# Patient Record
Sex: Female | Born: 1955
Health system: Southern US, Community
[De-identification: ages and names within clinical notes are randomized; demographics above are authoritative.]

## PROBLEM LIST (undated history)

## (undated) DIAGNOSIS — N950 Postmenopausal bleeding: Secondary | ICD-10-CM

## (undated) DIAGNOSIS — L309 Dermatitis, unspecified: Secondary | ICD-10-CM

## (undated) DIAGNOSIS — N882 Stricture and stenosis of cervix uteri: Secondary | ICD-10-CM

## (undated) DIAGNOSIS — I471 Supraventricular tachycardia: Secondary | ICD-10-CM

## (undated) DIAGNOSIS — Z860101 Personal history of adenomatous and serrated colon polyps: Secondary | ICD-10-CM

## (undated) DIAGNOSIS — C50919 Malignant neoplasm of unspecified site of unspecified female breast: Secondary | ICD-10-CM

## (undated) DIAGNOSIS — Z803 Family history of malignant neoplasm of breast: Secondary | ICD-10-CM

## (undated) DIAGNOSIS — Z8489 Family history of other specified conditions: Secondary | ICD-10-CM

## (undated) DIAGNOSIS — C50411 Malignant neoplasm of upper-outer quadrant of right female breast: Principal | ICD-10-CM

## (undated) DIAGNOSIS — M1990 Unspecified osteoarthritis, unspecified site: Secondary | ICD-10-CM

## (undated) DIAGNOSIS — Z800 Family history of malignant neoplasm of digestive organs: Secondary | ICD-10-CM

## (undated) DIAGNOSIS — R9389 Abnormal findings on diagnostic imaging of other specified body structures: Secondary | ICD-10-CM

## (undated) DIAGNOSIS — Z923 Personal history of irradiation: Secondary | ICD-10-CM

## (undated) DIAGNOSIS — Z170 Estrogen receptor positive status [ER+]: Secondary | ICD-10-CM

## (undated) DIAGNOSIS — Z9221 Personal history of antineoplastic chemotherapy: Secondary | ICD-10-CM

## (undated) DIAGNOSIS — Z86010 Personal history of colonic polyps: Secondary | ICD-10-CM

## (undated) DIAGNOSIS — T7840XA Allergy, unspecified, initial encounter: Secondary | ICD-10-CM

## (undated) DIAGNOSIS — Z8679 Personal history of other diseases of the circulatory system: Secondary | ICD-10-CM

## (undated) HISTORY — PX: WISDOM TOOTH EXTRACTION: SHX21

## (undated) HISTORY — PX: POLYPECTOMY: SHX149

## (undated) HISTORY — DX: Allergy, unspecified, initial encounter: T78.40XA

## (undated) HISTORY — DX: Malignant neoplasm of unspecified site of unspecified female breast: C50.919

## (undated) HISTORY — DX: Family history of malignant neoplasm of breast: Z80.3

## (undated) HISTORY — DX: Family history of malignant neoplasm of digestive organs: Z80.0

## (undated) HISTORY — DX: Malignant neoplasm of upper-outer quadrant of right female breast: C50.411

## (undated) HISTORY — PX: COLONOSCOPY: SHX174

## (undated) HISTORY — DX: Supraventricular tachycardia: I47.1

---

## 2001-01-28 ENCOUNTER — Other Ambulatory Visit: Admission: RE | Admit: 2001-01-28 | Discharge: 2001-01-28 | Payer: Self-pay | Admitting: Obstetrics & Gynecology

## 2002-02-17 ENCOUNTER — Other Ambulatory Visit: Admission: RE | Admit: 2002-02-17 | Discharge: 2002-02-17 | Payer: Self-pay | Admitting: Obstetrics & Gynecology

## 2003-06-29 ENCOUNTER — Other Ambulatory Visit: Admission: RE | Admit: 2003-06-29 | Discharge: 2003-06-29 | Payer: Self-pay | Admitting: Obstetrics & Gynecology

## 2004-08-15 ENCOUNTER — Other Ambulatory Visit: Admission: RE | Admit: 2004-08-15 | Discharge: 2004-08-15 | Payer: Self-pay | Admitting: Obstetrics & Gynecology

## 2004-08-30 ENCOUNTER — Encounter: Admission: RE | Admit: 2004-08-30 | Discharge: 2004-08-30 | Payer: Self-pay | Admitting: Obstetrics & Gynecology

## 2008-06-05 ENCOUNTER — Encounter: Admission: RE | Admit: 2008-06-05 | Discharge: 2008-06-05 | Payer: Self-pay | Admitting: Obstetrics & Gynecology

## 2008-08-19 ENCOUNTER — Ambulatory Visit: Payer: Self-pay | Admitting: Internal Medicine

## 2008-08-19 DIAGNOSIS — I471 Supraventricular tachycardia, unspecified: Secondary | ICD-10-CM | POA: Insufficient documentation

## 2009-06-14 DEATH — deceased

## 2010-11-27 NOTE — Letter (Signed)
August 19, 2008    Pam Drown, MD  [address]  [place], [place] [address]   RE:  [name], [name]  MRN:  [id number]  /  DOB:  [birth year]   Dear Toniann Fail,   It was a pleasure to see Catherine Lawson at your request because of  recurrent episodes of tachy palpitations.   As you know, she is a 55 year old married woman who has a history of  recurrent abrupt onset and offset tachy palpitations that have been  going on for about 9 or 10 years.  She counts her heart beat with them  and they are over 180 beats per minute.  They have lasted from 30  minutes up to more than 4 hours.  They initially occurred once or twice  a year, now occurring every couple of months.  They are unassociated  with shortness of breath, chest pain, or lightheadedness except at the  very initial moment.  There are some flushing at that time.  They are  diuretic negative, but they are frog positive.  She does not notice any  association with a little bit of caffeine that she uses.  She has not  tried current massage or Valsalva.   She is quite fit exercising multiple times per week.   PAST MEDICAL HISTORY:  Broadly negative.  Notable only for asthma and  review of systems in addition to this was probably negative across  multiple organ systems.   Her medications include Yasmin birth control pills and Zyrtec for  allergies.   She has no known drug allergies.   She has had no prior surgery.   She is married.  She does not use cigarettes or recreational drugs.  She  drinks alcohol occasionally.  She has no children.   PHYSICAL EXAMINATION:  GENERAL:  She is a middle-aged Caucasian female  appearing her stated age of 30.  VITAL SIGNS:  Her blood pressure is 114/72 and her pulse is 72.  HEENT:  No icterus or xanthoma.  NECK:  Veins were flat.  The carotids are brisk and full bilaterally  without bruits.  BACK:  Without kyphosis or scoliosis.  LUNGS:  Clear.  HEART:  Sounds were regular  without murmurs or gallops.  ABDOMEN:  Soft with active bowel sounds.  EXTREMITIES:  Femoral pulses were 2+.  Distal pulses were intact.  There  is no clubbing, cyanosis, or edema.  NEUROLOGIC:  Grossly normal.  SKIN:  Warm and dry.   Electrocardiogram dated today demonstrated sinus rhythm at 72 with  intervals of 0.16/0.19/0.42.  The axis was 83.   IMPRESSION:  Almost certainly supraventricular tachycardia mediated  atrioventricular nodal reentry.   DISCUSSION:  Toniann Fail, [name]' symptoms are consistent with AV nodal  reentry, which is a common form of supraventricular tachycardia in  woman.  It is possible, but unlikely that represents a benign form of  ventricular tachycardia  based on her extreme exercise program and the  lack of symptoms.   We discussed treatment options for supraventricular tachycardia and also  as well as the likelihood that this was the diagnosis.  These included  vagal maneuvers including carotid massage and Valsalva, p.r.n. beta-  blockers or calcium blockers.  Daily therapy with similar drugs and/or  catheter ablation.  We discussed the potential benefits as well as  potential risks including, but not limited to death, perforation, and  heart block.  She understands these risks.   She would like to take  no medications at this point.  She would like to  see if she can use vagal maneuvers to terminate her tachycardia.  I have  advised her that she can also go to hospital in the event that they  persist.   She would like to get back up with [place] as her niece dictate and so no  followup was scheduled.   Thanks very much for allowing [place] to see her.    Sincerely,      Duke Salvia, MD, [facility]  Electronically Signed    SCK/MedQ  DD: 08/19/2008  DT: 08/20/2008  Job #: [id number]

## 2015-02-06 ENCOUNTER — Encounter: Payer: Self-pay | Admitting: *Deleted

## 2015-02-06 ENCOUNTER — Telehealth: Payer: Self-pay | Admitting: *Deleted

## 2015-02-06 DIAGNOSIS — Z170 Estrogen receptor positive status [ER+]: Secondary | ICD-10-CM

## 2015-02-06 DIAGNOSIS — C50411 Malignant neoplasm of upper-outer quadrant of right female breast: Secondary | ICD-10-CM

## 2015-02-06 DIAGNOSIS — Z853 Personal history of malignant neoplasm of breast: Secondary | ICD-10-CM | POA: Insufficient documentation

## 2015-02-06 NOTE — Telephone Encounter (Signed)
Left vm for pt to return call regarding [place] appt for 02/08/15

## 2015-02-06 NOTE — Telephone Encounter (Signed)
Confirmed [facility] for 02/08/15 at 1230 .  Instructions and contact information given.

## 2015-02-08 ENCOUNTER — Ambulatory Visit: Payer: Managed Care, Other (non HMO)

## 2015-02-08 ENCOUNTER — Encounter: Payer: Self-pay | Admitting: Oncology

## 2015-02-08 ENCOUNTER — Ambulatory Visit
Admission: RE | Admit: 2015-02-08 | Discharge: 2015-02-08 | Disposition: A | Discharge status: Home / Self Care | Payer: Managed Care, Other (non HMO) | Source: Ambulatory Visit | Attending: Radiation Oncology | Admitting: Radiation Oncology

## 2015-02-08 ENCOUNTER — Encounter: Payer: Self-pay | Admitting: Genetic Counselor

## 2015-02-08 ENCOUNTER — Other Ambulatory Visit: Payer: Self-pay | Admitting: *Deleted

## 2015-02-08 ENCOUNTER — Ambulatory Visit (HOSPITAL_BASED_OUTPATIENT_CLINIC_OR_DEPARTMENT_OTHER): Payer: Managed Care, Other (non HMO) | Admitting: Oncology

## 2015-02-08 ENCOUNTER — Other Ambulatory Visit (HOSPITAL_BASED_OUTPATIENT_CLINIC_OR_DEPARTMENT_OTHER): Payer: Managed Care, Other (non HMO)

## 2015-02-08 ENCOUNTER — Ambulatory Visit: Payer: Managed Care, Other (non HMO) | Attending: General Surgery | Admitting: Physical Therapy

## 2015-02-08 ENCOUNTER — Encounter: Payer: Self-pay | Admitting: Physical Therapy

## 2015-02-08 ENCOUNTER — Encounter: Payer: Self-pay | Admitting: *Deleted

## 2015-02-08 ENCOUNTER — Telehealth: Payer: Self-pay | Admitting: Oncology

## 2015-02-08 ENCOUNTER — Encounter: Payer: Self-pay | Admitting: Nurse Practitioner

## 2015-02-08 ENCOUNTER — Encounter (INDEPENDENT_AMBULATORY_CARE_PROVIDER_SITE_OTHER): Payer: Self-pay

## 2015-02-08 VITALS — BP 143/68 | HR 73 | Temp 98.4°F | Resp 18 | Ht 65.0 in | Wt 144.1 lb

## 2015-02-08 DIAGNOSIS — Z170 Estrogen receptor positive status [ER+]: Secondary | ICD-10-CM

## 2015-02-08 DIAGNOSIS — C50411 Malignant neoplasm of upper-outer quadrant of right female breast: Secondary | ICD-10-CM | POA: Insufficient documentation

## 2015-02-08 DIAGNOSIS — I471 Supraventricular tachycardia: Secondary | ICD-10-CM

## 2015-02-08 DIAGNOSIS — C50912 Malignant neoplasm of unspecified site of left female breast: Secondary | ICD-10-CM

## 2015-02-08 LAB — COMPREHENSIVE METABOLIC PANEL (CC13)
ALT: 16 U/L (ref 0–55)
AST: 20 U/L (ref 5–34)
Albumin: 4.4 g/dL (ref 3.5–5.0)
Alkaline Phosphatase: 111 U/L (ref 40–150)
Anion Gap: 9 mEq/L (ref 3–11)
BUN: 14 mg/dL (ref 7.0–26.0)
CALCIUM: 9.7 mg/dL (ref 8.4–10.4)
CHLORIDE: 109 meq/L (ref 98–109)
CO2: 26 mEq/L (ref 22–29)
CREATININE: 0.9 mg/dL (ref 0.6–1.1)
EGFR: 69 mL/min/{1.73_m2} — ABNORMAL LOW (ref >90)
GLUCOSE: 87 mg/dL (ref 70–140)
Potassium: 4.7 mEq/L (ref 3.5–5.1)
Sodium: 144 mEq/L (ref 136–145)
TOTAL PROTEIN: 7.3 g/dL (ref 6.4–8.3)
Total Bilirubin: 0.57 mg/dL (ref 0.20–1.20)

## 2015-02-08 LAB — CBC WITH DIFFERENTIAL/PLATELET
BASO%: 0.8 % (ref 0.0–2.0)
BASOS ABS: 0.1 10*3/uL (ref 0.0–0.1)
EOS%: 0.5 % (ref 0.0–7.0)
Eosinophils Absolute: 0 10*3/uL (ref 0.0–0.5)
HCT: 42.9 % (ref 34.8–46.6)
HEMOGLOBIN: 14.4 g/dL (ref 11.6–15.9)
LYMPH#: 1.8 10*3/uL (ref 0.9–3.3)
LYMPH%: 20.9 % (ref 14.0–49.7)
MCH: 30.3 pg (ref 25.1–34.0)
MCHC: 33.7 g/dL (ref 31.5–36.0)
MCV: 90 fL (ref 79.5–101.0)
MONO#: 0.6 10*3/uL (ref 0.1–0.9)
MONO%: 6.7 % (ref 0.0–14.0)
NEUT%: 71.1 % (ref 38.4–76.8)
NEUTROS ABS: 6.2 10*3/uL (ref 1.5–6.5)
Platelets: 290 10*3/uL (ref 145–400)
RBC: 4.76 10*6/uL (ref 3.70–5.45)
RDW: 13.1 % (ref 11.2–14.5)
WBC: 8.8 10*3/uL (ref 3.9–10.3)

## 2015-02-08 MED ORDER — ANASTROZOLE 1 MG PO TABS: 1.0000 mg | ORAL_TABLET | Freq: Every day | ORAL | Status: DC

## 2015-02-08 NOTE — Progress Notes (Signed)
Ms. Guerin is a very pleasant 59 y.o. female from [place], [place] with newly diagnosed grade 2-3 invasive ductal carcinoma of the right breast.  Biopsy results revealed the tumor's prognostic profile is ER positive, PR positive, and HER2/neu negative. Ki67 is 40%.  She presents today with her husband to the [facility]) for treatment consideration and recommendations from the breast surgeon, radiation oncologist, and medical oncologist.     I briefly met with Ms. Defalco and her husband during her [facility] visit today. We discussed the purpose of the Survivorship Clinic, which will include monitoring for recurrence, coordinating completion of age and gender-appropriate cancer screenings, promotion of overall wellness, as well as managing potential late/long-term side effects of anti-cancer treatments.    The treatment plan for Ms. Fletes will likely include neoadjuvant anti-estrogen therapy, surgery, radiation therapy, and adjuvant anti-estrogen therapy.  She will meet with the Genetics Counselor due to her family history of breast cancer. As of today, the intent of treatment for Ms. Lamboy is cure, therefore she will be eligible for the Survivorship Clinic upon her completion of treatment.  Her survivorship care plan (SCP) document will be drafted and updated throughout the course of her treatment trajectory. She will receive the SCP in an office visit with myself in the Survivorship Clinic once she has completed treatment.   Ms. Chirino was encouraged to ask questions and all questions were answered to her satisfaction.  She was given my business card and encouraged to contact me with any concerns regarding survivorship.  I look forward to participating in her care.   Kenn File, NP [facility] [phone]

## 2015-02-08 NOTE — Progress Notes (Signed)
Checked in new pt with no financial concerns.  Pt has my card for any billing questions or concerns. ° °

## 2015-02-08 NOTE — Progress Notes (Signed)
g Radiation Oncology         [phone] ________________________________  Name: Catherine Lawson MRN: [id number]  Date: 02/08/2015  DOB: 1955-11-01  GB:EEFEOFH,QRFXJ, MD  Excell Seltzer, MD     REFERRING PHYSICIAN: Excell Seltzer, MD   DIAGNOSIS: The encounter diagnosis was Breast cancer of upper-outer quadrant of right female breast.    HISTORY OF PRESENT ILLNESS::Catherine Lawson is a 59 y.o. female who is seen for an initial consultation visit regarding the patient's diagnosis of breast cancer.  The patient was found to have suspicious findings within the right breast on initial mammogram. The patient has not had symptoms prior to this study. A diagnostic mammogram and breast ultrasound confirmed this finding. On ultrasound, the tumors measured 1.9 cm and 7 mm (2 cm apart) and was present in the upper-outer quadrant. Axillary lymph nodes were negative.   A biopsy was performed. This revealed invasive ductal carcinoma. Receptors studies were completed and indicate that the tumor is estrogen receptor positve, progesterone receptor positive, and Her-2/neu negative. The Ki-67 staining was 40%.  The patient has not undergone an MRI scan of the breasts: An MRI will be scheduled.    PREVIOUS RADIATION THERAPY: No   PAST MEDICAL HISTORY:  has a past medical history of Breast cancer of upper-outer quadrant of right female breast (02/06/2015) and Breast cancer.     PAST SURGICAL HISTORY:History reviewed. No pertinent past surgical history.   FAMILY HISTORY: family history includes Breast cancer (age of onset: 69) in her sister; Cancer in her maternal grandmother and other; Colon cancer in her father; Stomach cancer in her paternal grandmother.   SOCIAL HISTORY:  reports that she has never smoked. She does not have any smokeless tobacco history on file. She reports that she drinks alcohol. She reports that she does not use illicit drugs.   ALLERGIES: Review of patient's allergies  indicates no known allergies.   MEDICATIONS:  Current Outpatient Prescriptions  Medication Sig Dispense Refill  . anastrozole (ARIMIDEX) 1 MG tablet Take 1 tablet (1 mg total) by mouth daily. 90 tablet 4  . cetirizine (ZYRTEC) 10 MG tablet Take 10 mg by mouth daily.    . halobetasol (ULTRAVATE) 0.05 % ointment Apply topically as needed.     No current facility-administered medications for this encounter.     REVIEW OF SYSTEMS:  A 15 point review of systems is documented in the electronic medical record. This was obtained by the nursing staff. However, I reviewed this with the patient to discuss relevant findings and make appropriate changes.  Pertinent items are noted in HPI.    PHYSICAL EXAM:    Vitals with Age-Percentiles 02/08/2015  Length 883.2 cm  Systolic 549  Diastolic 68  Pulse 73  Respiration 18  Weight 65.363 kg  BMI 24     ECOG = 0  0 - Asymptomatic (Fully active, able to carry on all predisease activities without restriction)  1 - Symptomatic but completely ambulatory (Restricted in physically strenuous activity but ambulatory and able to carry out work of a light or sedentary nature. For example, light housework, office work)  2 - Symptomatic, <50% in bed during the day (Ambulatory and capable of all self care but unable to carry out any work activities. Up and about more than 50% of waking hours)  3 - Symptomatic, >50% in bed, but not bedbound (Capable of only limited self-care, confined to bed or chair 50% or more of waking hours)  4 - Bedbound (Completely disabled.  Cannot carry on any self-care. Totally confined to bed or chair)  5 - Death   Eustace Pen MM, Creech RH, Tormey DC, et al. [phone]). "Toxicity and response criteria of the [facility]". [place] Oncol. 5 (6): 649-55  General: Well-developed, in no acute distress HEENT: Normocephalic, atraumatic; oral cavity clear Neck: Supple without any lymphadenopathy Cardiovascular:  Regular rate and rhythm Respiratory: Clear to auscultation bilaterally Breasts: Bruising in lateral right breast, with underlying biopsy changes, otherwise normal appearing GI: Soft, nontender, normal bowel sounds Extremities: No edema present Neuro: No focal deficits   LABORATORY DATA:  Lab Results  Component Value Date   WBC 8.8 02/08/2015   HGB 14.4 02/08/2015   HCT 42.9 02/08/2015   MCV 90.0 02/08/2015   PLT 290 02/08/2015   Lab Results  Component Value Date   NA 144 02/08/2015   K 4.7 02/08/2015   CO2 26 02/08/2015   Lab Results  Component Value Date   ALT 16 02/08/2015   AST 20 02/08/2015   ALKPHOS 111 02/08/2015   BILITOT 0.57 02/08/2015      RADIOGRAPHY: No results found.     IMPRESSION:    Breast cancer of upper-outer quadrant of right female breast   02/06/2015 Initial Diagnosis Breast cancer of upper-outer quadrant of right female breast    The patient has a recent diagnosis of invasive ductal carcinoma of the right breast. She appears to be a good candidate for breast conservation treatment.  I discussed with the patient the role of adjuvant radiation treatment in this setting. We discussed the potential benefit of radiation treatment, especially with regards to local control of the patient's tumor. We also discussed the possible side effects and risks of such a treatment as well.  All of the patient's questions were answered. Patient will be receiving anti-estrogen treatment and Oncotype testing These studies will determine options for lumpectomy vs mastectomy.   PLAN: I look forward to seeing the patient postoperatively to review her case and further discuss and coordinate an anticipated course of radiation treatment.       ________________________________   Jodelle Gross, MD, PhD     This document serves as a record of services personally performed by Kyung Rudd, MD. It was created on his behalf by Derek Mound, a trained medical scribe. The  creation of this record is based on the scribe's personal observations and the provider's statements to them. This document has been checked and approved by the attending provider.

## 2015-02-08 NOTE — Progress Notes (Signed)
[facility]  [phone] [phone]     ID: Catherine Lawson DOB: [birth year]  MR#: [id number]  RCB#:[id number]  Patient Care Team: Cari Caraway, MD as PCP - General (Family Medicine) Excell Seltzer, MD as Consulting Physician (General Surgery) Chauncey Cruel, MD as Consulting Physician (Oncology) Kyung Rudd, MD as Consulting Physician (Radiation Oncology) Mauro Kaufmann, RN as Registered Nurse Rockwell Germany, RN as Registered Nurse Sylvan Cheese, NP as Nurse Practitioner (Nurse Practitioner) Vania Rea, MD as Consulting Physician (Obstetrics and Gynecology) PCP: Cari Caraway, MD OTHER MD:  CHIEF COMPLAINT: Estrogen receptor positive breast cancer  CURRENT TREATMENT: Neoadjuvant anastrozole   BREAST CANCER HISTORY: "Catherine Lawson" had routine screening mammography at [facility] 01/24/2015. This showed a 1.6 cm mass in the right breast at the 11:00 position, and on 0.8 cm irregular density also at the 11:00 position farther away from the nipple. On 02/01/2015 she underwent a unilateral right diagnostic mammography at [facility]. The breast density was category B. In addition to the 2 masses previously noted there was a cluster of calcifications at the 9:00 position. Ultrasonography the same day showed a 1.9 cm oval mass, which was hypoechoic with no vascularity and hard on L a Stogner if he, a 7 mm mass at the 10:00 position with no vascularity and intermediate L a Stogner 3, and a lymph node with uniform cortex thickening in the right axilla. The 2 masses in question were separated by 2 cm.  On 02/02/2015 the patient underwent biopsy of what appears to have been the larger of the 2 breast masses (I cannot locate the biopsy report). This documented invasive ductal carcinoma, grade 2 or 3, with micropapillary features, estrogen and progesterone receptor positive, with an MIB-1 of 40%, and HER-2 not amplified with a signals ratio of 1.36 and a number per cell  of 3.60. There was evidence of lymphovascular invasion.  Because of bleeding from the first biopsy, the second breast mass could not be performed. The lymph node was aspirated, not cord, and this appeared benign but the concordance is questionable.  The patient's subsequent history is as detailed below  INTERVAL HISTORY: Catherine Lawson was evaluated in the multidisciplinary breast cancer clinic 02/08/2015 accompanied by her husband Event organiser. Her case was also presented in the multidisciplinary breast cancer conference that same morning. At that time a preliminary plan was proposed: Genetics testing, breast MRI and additional biopsies as needed if breast conserving surgery was planned. Oncotype was proposed. Neoadjuvant treatment was suggested. The patient was felt also to be likely to benefit from radiation  REVIEW OF SYSTEMS: There were no specific symptoms leading to the original mammogram, which was routinely scheduled. The patient denies unusual headaches, visual changes, nausea, vomiting, stiff neck, dizziness, or gait imbalance. There has been no cough, phlegm production, or pleurisy, no chest pain or pressure, and no change in bowel or bladder habits. The patient denies fever, rash, bleeding, unexplained fatigue or unexplained weight loss. She does have a history of palpitations which have been extensively worked up and which are improving now that she has retired from her stressful job. A detailed review of systems was otherwise entirely negative.  PAST MEDICAL HISTORY: Past Medical History  Diagnosis Date  . Breast cancer of upper-outer quadrant of right female breast 02/06/2015  . Breast cancer     PAST SURGICAL HISTORY: History reviewed. No pertinent past surgical history.  FAMILY HISTORY Family History  Problem Relation Age of Onset  . Breast cancer Sister 55  .  Colon cancer Father   . Cancer Maternal Grandmother   . Stomach cancer Paternal Grandmother   . Cancer Other    the patient's  father is still living, at age 58. The patient's mother died from Parkinson's disease complications at age 58 The patient had 5 brothers, 4 sisters. One sister was diagnosed with breast cancer at age 67 and died at age 58. The patient's father was diagnosed with colon cancer at the age of 66. A nephew was diagnosed with medulloblastoma at age 36 and died at age 60. The paternal grandmother was diagnosed with stomach cancer at age 58. The maternal grandmother was diagnosed with metastatic cancer of unknown type at age 58. (The description is suggestive of an ovarian cancer).  GYNECOLOGIC HISTORY:  No LMP recorded. Menarche age 89. The patient is GX P0. She stopped having periods in 2014. She did not take hormone replacement. She took oral contraceptives for approximately 20 years with no complications.  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 Her husband Catherine Lawson works for a Merchandiser, retail in [employer] "[employer] credited Warehouse manager". Catherine Lawson has 2 daughters from an earlier marriage, Catherine Lawson lives in [place] and works in recruiting, and Catherine Lawson lives in [place] and works in Press photographer. They are expecting their first grandchild September of this year. The patient attends the [employer]   HEALTH MAINTENANCE: History  Substance Use Topics  . Smoking status: Never Smoker   . Smokeless tobacco: Not on file  . Alcohol Use: Yes     Colonoscopy: 2014/lobe are  PAP: 2015  Bone density: Remote  Lipid panel:  No Known Allergies  Current Outpatient Prescriptions  Medication Sig Dispense Refill  . cetirizine (ZYRTEC) 10 MG tablet Take 10 mg by mouth daily.    . halobetasol (ULTRAVATE) 0.05 % ointment Apply topically as needed.    Marland Kitchen anastrozole (ARIMIDEX) 1 MG tablet Take 1 tablet (1 mg total) by mouth daily. 90 tablet 4   No current facility-administered medications for this visit.     OBJECTIVE: Middle-aged white woman who appears younger than stated age 59 Vitals:   02/08/15 1320  BP: 143/68  Pulse: 73  Temp: 98.4 F (36.9 C)  Resp: 18     Body mass index is 23.98 kg/(m^2).    ECOG FS:0 - Asymptomatic  Ocular: Sclerae unicteric, pupils equal, round and reactive to light Ear-nose-throat: Oropharynx clear and moist Lymphatic: No cervical or supraclavicular adenopathy Lungs no rales or rhonchi, good excursion bilaterally Heart regular rate and rhythm, no murmur appreciated Abd soft, nontender, positive bowel sounds MSK no focal spinal tenderness, no joint edema Neuro: non-focal, well-oriented, appropriate affect Breasts: The right breast is status post recent biopsy. There is an ecchymosis at the site with a palpable mass, which is likely hematoma as there was no palpable mass there previously per prior exams. There are no other skin or nipple changes of concern. The right axilla is benign per the left breast is unremarkable.   LAB RESULTS:  CMP     Component Value Date/Time   NA 144 02/08/2015 1246   K 4.7 02/08/2015 1246   CO2 26 02/08/2015 1246   GLUCOSE 87 02/08/2015 1246   BUN 14.0 02/08/2015 1246   CREATININE 0.9 02/08/2015 1246   CALCIUM 9.7 02/08/2015 1246   PROT 7.3 02/08/2015 1246   ALBUMIN 4.4 02/08/2015 1246   AST 20 02/08/2015 1246  ALT 16 02/08/2015 1246   ALKPHOS 111 02/08/2015 1246   BILITOT 0.57 02/08/2015 1246    INo results found for: SPEP, UPEP  Lab Results  Component Value Date   WBC 8.8 02/08/2015   NEUTROABS 6.2 02/08/2015   HGB 14.4 02/08/2015   HCT 42.9 02/08/2015   MCV 90.0 02/08/2015   PLT 290 02/08/2015      Chemistry      Component Value Date/Time   NA 144 02/08/2015 1246   K 4.7 02/08/2015 1246   CO2 26 02/08/2015 1246   BUN 14.0 02/08/2015 1246   CREATININE 0.9 02/08/2015 1246      Component Value Date/Time   CALCIUM 9.7 02/08/2015 1246   ALKPHOS 111 02/08/2015 1246   AST 20 02/08/2015 1246    ALT 16 02/08/2015 1246   BILITOT 0.57 02/08/2015 1246       No results found for: LABCA2  No components found for: LABCA125  No results for input(s): INR in the last 168 hours.  Urinalysis No results found for: COLORURINE, APPEARANCEUR, LABSPEC, PHURINE, GLUCOSEU, HGBUR, BILIRUBINUR, KETONESUR, PROTEINUR, UROBILINOGEN, NITRITE, LEUKOCYTESUR  STUDIES: No results found.  ASSESSMENT: 59 y.o. [place] woman status post right breast biopsy 02/02/2015 for a clinically multifocal T1c NX, stage IA invasive ductal carcinoma, grade 2 or 3, estrogen and progesterone receptor positive, with HER-2 not amplified and the MIB-1 at 40%  (1) genetics testing pending  (2) awaiting definitive surgery (pending breast MRI and additional biopsies)  (3) Oncotype to be sent if the tumor proves to be node negative; if not positive will proceed to chemotherapy  (4) radiation to follow as appropriate  (5) neoadjuvant anastrozole started 02/08/2015  PLAN: We spent the better part of today's hour-long appointment discussing the biology of breast cancer in general, and the specifics of the patient's tumor in particular. Catherine Lawson understands she has an invasive ductal breast cancer which appears to be stage I. The problem is that because of the bleeding at the time of her biopsy we did not get biopsy of this second, smaller tumor in her breast and the lymph node was not biopsied but aspirated. This makes the results a little bit less reliable.  We discussed the fact that there is no benefit in terms of survival between mastectomy as compared to lumpectomy and radiation. We also discussed that there is also no difference in survival in sequencing between systemic therapy first and local namely surgical treatment first.  In her case we recommend neoadjuvant hormone therapy. The reason for recommending neoadjuvant systemic therapy is the fact that she needs genetic testing and this takes a couple of weeks, at a  minimum, and if positive she may want to consider bilateral mastectomies and reconstruction, which would take several more weeks to set up. If she starts systemic therapy now there would be no rush and she can take as long as necessary to make those decisions.  We then discussed the options for systemic therapy. She is a good candidate for anti-estrogens. She is not at all a candidate for anti-HER-2 immunotherapy. The big question is chemotherapy. My feeling is that she will fall in the intermediate range on the Oncotype and if so she would receive chemotherapy. However we don't know that and we are not going to send that test until after her surgery.  Accordingly the plan will be to start with neoadjuvant anti-estrogens. Specifically we discussed anastrozole today. She has a good understanding of the possible toxicities, side effects and complications of this  agent. She is going to start later today. I am going to see her again in about 3 months to make sure she is tolerating it and she will have a repeat breast MRI before that visit.  She will also have a bone density before that visit, and if there is a concern in that regard, which I doubt given her general state of health, we would consider bone building treatment  The patient has a good understanding of the overall plan. She agrees with it. She knows the goal of treatment in her case is cure. She will call with any problems that may develop before her next visit here.  Chauncey Cruel, MD   02/08/2015 5:09 PM Medical Oncology and Hematology [facility] [address] [place], [place] [address] Tel. [phone]    Fax. [phone]

## 2015-02-08 NOTE — Telephone Encounter (Signed)
Appointments made,dexa scan at [facility] 9 15 16  3:00,avs printed for patient while at [facility]-if patient does not check out then avs will be mailed to patient

## 2015-02-08 NOTE — Therapy (Signed)
[place], [place], [address] Phone: [phone]   Fax:  [phone]  Physical Therapy Evaluation  Patient Details  Name: Catherine Lawson MRN: [id number] Date of Birth: Dec 08, 1955 Referring Provider:  Excell Seltzer, MD  Encounter Date: 02/08/2015      PT End of Session - 02/08/15 1711    Visit Number 1   Number of Visits 1   PT Start Time 1415   PT Stop Time 1426  Also saw pt from [id number] and [phone]; total of 31 minutes   PT Time Calculation (min) 11 min   Activity Tolerance Patient tolerated treatment well   Behavior During Therapy [facility] for tasks assessed/performed      Past Medical History  Diagnosis Date  . Breast cancer of upper-outer quadrant of right female breast 02/06/2015  . Breast cancer     History reviewed. No pertinent past surgical history.  There were no vitals filed for this visit.  Visit Diagnosis:  Carcinoma of upper-outer quadrant of right female breast - Plan: PT plan of care cert/re-cert      Subjective Assessment - 02/08/15 1712    Pertinent History Patient was diagnosed 01/24/15 with right ER/PR positive, HER2 negative breast cancer with a Ki67 of 40%.  One node was biopsied and was negative.  It is located in the upper outer quadrant.  She has 2 masses which are 2 cm apart.  They each measure 1.9cm and 1.7cm.             [facility] PT Assessment - 02/08/15 0001    Assessment   Medical Diagnosis Right breast cancer   Onset Date/Surgical Date 01/24/15   Hand Dominance Right   Prior Therapy no   Precautions   Precautions Other (comment)  Active breast cancer   Restrictions   Weight Bearing Restrictions No   Balance Screen   Has the patient fallen in the past 6 months No   Has the patient had a decrease in activity level because of a fear of falling?  No   Is the patient reluctant to leave their home because of a fear of falling?  No   Home Social research officer, government residence   Living Arrangements Spouse/significant other   Available Help at Discharge Family   Prior Function   Level of Gamaliel Retired   Leisure Goes to [employer] 5x/wk doing cardio and strength exercises   Cognition   Overall Cognitive Status Within Functional Limits for tasks assessed   Posture/Postural Control   Posture/Postural Control No significant limitations   ROM / Strength   AROM / PROM / Strength AROM;Strength   AROM   AROM Assessment Site Shoulder   Right/Left Shoulder Right;Left   Right Shoulder Extension 47 Degrees   Right Shoulder Flexion 160 Degrees   Right Shoulder ABduction 175 Degrees   Right Shoulder Internal Rotation 68 Degrees   Right Shoulder External Rotation 75 Degrees   Left Shoulder Extension 57 Degrees   Left Shoulder Flexion 155 Degrees   Left Shoulder ABduction 165 Degrees   Left Shoulder Internal Rotation 59 Degrees   Left Shoulder External Rotation 81 Degrees   Strength   Overall Strength Within functional limits for tasks performed           LYMPHEDEMA/ONCOLOGY QUESTIONNAIRE - 02/08/15 1709    Type   Cancer Type Right breast cancer   Lymphedema Assessments   Lymphedema Assessments Upper extremities   Right  Upper Extremity Lymphedema   10 cm Proximal to Olecranon Process 27.8 cm   Olecranon Process 24.7 cm   10 cm Proximal to Ulnar Styloid Process 22.2 cm   Just Proximal to Ulnar Styloid Process 14.9 cm   Across Hand at [employer] 18.5 cm   At [place] of 2nd Digit 5.8 cm   Left Upper Extremity Lymphedema   10 cm Proximal to Olecranon Process 26.9 cm   Olecranon Process 24.3 cm   10 cm Proximal to Ulnar Styloid Process 20.8 cm   Just Proximal to Ulnar Styloid Process 14.6 cm   Across Hand at [employer] 18.4 cm   At [place] of 2nd Digit 5.7 cm        Patient was instructed today in a home exercise program today for post op shoulder range of motion. These included active  assist shoulder flexion in sitting, scapular retraction, wall walking with shoulder abduction, and hands behind head external rotation.  She was encouraged to do these twice a day, holding 3 seconds and repeating 5 times when permitted by her physician.           PT Education - 02/08/15 1711    Education provided Yes   Education Details Post op shoulder ROM HEP and lymphedema risk reduction   Person(s) Educated Patient;Spouse   Methods Explanation;Demonstration;Handout   Comprehension Verbalized understanding;Returned demonstration              Breast Clinic Goals - 02/08/15 1716    Patient will be able to verbalize understanding of pertinent lymphedema risk reduction practices relevant to her diagnosis specifically related to skin care.   Time 1   Period Days   Status Achieved   Patient will be able to return demonstrate and/or verbalize understanding of the post-op home exercise program related to regaining shoulder range of motion.   Time 1   Period Days   Status Achieved   Patient will be able to verbalize understanding of the importance of attending the postoperative After Breast Cancer Class for further lymphedema risk reduction education and therapeutic exercise.   Time 1   Period Days   Status Achieved              Plan - 02/08/15 1713    Clinical Impression Statement Patient was diagnosed 01/24/15 with right ER/PR positive, HER2 negative breast cancer with a Ki67 of 40%.  One node was biopsied and was negative.  It is located in the upper outer quadrant.  She has 2 masses which are 2 cm apart.  They each measure 1.9cm and 1.7cm.   She is planning to have genetic testing done and a breast MRI.  She will undergo neoadjuvant Anastrozole for possibly 6 months.  Depending on genetic testing and MRI results, she may have a right lumpectomy or mastectomy, but either way she would have a sentinel node biopsy.  She will likely have radiation treatment.  She will have  Oncotype testing is she is node negative and chemotherapy if she is node positive.   Pt will benefit from skilled therapeutic intervention in order to improve on the following deficits Decreased range of motion;Impaired UE functional use;Decreased knowledge of precautions;Pain;Decreased strength   Rehab Potential Excellent   Clinical Impairments Affecting Rehab Potential none   PT Frequency One time visit   PT Treatment/Interventions Patient/family education;Therapeutic exercise   Consulted and Agree with Plan of Care Patient;Family member/caregiver   Family Member Consulted Husband     Patient will  follow up at outpatient cancer rehab if needed following surgery.  If the patient requires physical therapy at that time, a specific plan will be dictated and sent to the referring physician for approval. The patient was educated today on appropriate basic range of motion exercises to begin post operatively and the importance of attending the After Breast Cancer class following surgery.  Patient was educated today on lymphedema risk reduction practices as it pertains to recommendations that will benefit the patient immediately following surgery.  She verbalized good understanding.  No additional physical therapy is indicated at this time.       Problem List Patient Active Problem List   Diagnosis Date Noted  . Breast cancer of upper-outer quadrant of right female breast 02/06/2015  . SVT/ PSVT/ PAT 08/19/2008    Annia Friendly, PT 02/08/2015 5:18 PM  [place], [place], [address] Phone: [phone]   Fax:  [phone]

## 2015-02-08 NOTE — Patient Instructions (Signed)

## 2015-02-10 ENCOUNTER — Encounter: Payer: Self-pay | Admitting: Radiation Oncology

## 2015-02-12 ENCOUNTER — Inpatient Hospital Stay: Admission: RE | Admit: 2015-02-12 | Payer: Managed Care, Other (non HMO) | Source: Ambulatory Visit

## 2015-02-13 ENCOUNTER — Other Ambulatory Visit: Payer: Self-pay | Admitting: *Deleted

## 2015-02-13 ENCOUNTER — Telehealth: Payer: Self-pay | Admitting: *Deleted

## 2015-02-13 DIAGNOSIS — C50411 Malignant neoplasm of upper-outer quadrant of right female breast: Secondary | ICD-10-CM

## 2015-02-13 NOTE — Telephone Encounter (Signed)
Spoke with patient from [facility] 7/27.  She is doing well.  No questions or concerns at this time.  She is going to see oral surgeon to have 2 wisdom teeth removed.  Encouraged her to call with any needs or concerns.

## 2015-02-15 ENCOUNTER — Other Ambulatory Visit: Payer: Self-pay | Admitting: Oncology

## 2015-02-20 ENCOUNTER — Ambulatory Visit
Admission: RE | Admit: 2015-02-20 | Discharge: 2015-02-20 | Disposition: A | Discharge status: Home / Self Care | Payer: Managed Care, Other (non HMO) | Source: Ambulatory Visit | Attending: General Surgery | Admitting: General Surgery

## 2015-02-20 DIAGNOSIS — C50411 Malignant neoplasm of upper-outer quadrant of right female breast: Secondary | ICD-10-CM

## 2015-02-20 MED ORDER — GADOBENATE DIMEGLUMINE 529 MG/ML IV SOLN
13.0000 mL | Freq: Once | INTRAVENOUS | Status: AC | PRN
  Administered 2015-02-20: 13 mL via INTRAVENOUS

## 2015-02-22 ENCOUNTER — Ambulatory Visit (HOSPITAL_BASED_OUTPATIENT_CLINIC_OR_DEPARTMENT_OTHER): Payer: Managed Care, Other (non HMO) | Admitting: Genetic Counselor

## 2015-02-22 ENCOUNTER — Encounter: Payer: Self-pay | Admitting: Genetic Counselor

## 2015-02-22 ENCOUNTER — Other Ambulatory Visit (HOSPITAL_BASED_OUTPATIENT_CLINIC_OR_DEPARTMENT_OTHER): Payer: Managed Care, Other (non HMO)

## 2015-02-22 DIAGNOSIS — Z800 Family history of malignant neoplasm of digestive organs: Secondary | ICD-10-CM | POA: Diagnosis not present

## 2015-02-22 DIAGNOSIS — Z8041 Family history of malignant neoplasm of ovary: Secondary | ICD-10-CM | POA: Insufficient documentation

## 2015-02-22 DIAGNOSIS — C50912 Malignant neoplasm of unspecified site of left female breast: Secondary | ICD-10-CM | POA: Diagnosis not present

## 2015-02-22 DIAGNOSIS — Z803 Family history of malignant neoplasm of breast: Secondary | ICD-10-CM | POA: Insufficient documentation

## 2015-02-22 LAB — COMPREHENSIVE METABOLIC PANEL (CC13)
ALK PHOS: 117 U/L (ref 40–150)
ALT: 19 U/L (ref 0–55)
ANION GAP: 8 meq/L (ref 3–11)
AST: 19 U/L (ref 5–34)
Albumin: 4.4 g/dL (ref 3.5–5.0)
BILIRUBIN TOTAL: 0.46 mg/dL (ref 0.20–1.20)
BUN: 13.3 mg/dL (ref 7.0–26.0)
CO2: 27 mEq/L (ref 22–29)
CREATININE: 1 mg/dL (ref 0.6–1.1)
Calcium: 10.1 mg/dL (ref 8.4–10.4)
Chloride: 108 mEq/L (ref 98–109)
EGFR: 66 mL/min/{1.73_m2} — AB (ref >90)
Glucose: 91 mg/dl (ref 70–140)
Potassium: 4.7 mEq/L (ref 3.5–5.1)
SODIUM: 143 meq/L (ref 136–145)
TOTAL PROTEIN: 7.3 g/dL (ref 6.4–8.3)

## 2015-02-22 LAB — CBC WITH DIFFERENTIAL/PLATELET
BASO%: 0.5 % (ref 0.0–2.0)
Basophils Absolute: 0 10*3/uL (ref 0.0–0.1)
EOS%: 1.2 % (ref 0.0–7.0)
Eosinophils Absolute: 0.1 10*3/uL (ref 0.0–0.5)
HCT: 44.9 % (ref 34.8–46.6)
HGB: 15.5 g/dL (ref 11.6–15.9)
LYMPH%: 33.1 % (ref 14.0–49.7)
MCH: 31 pg (ref 25.1–34.0)
MCHC: 34.5 g/dL (ref 31.5–36.0)
MCV: 89.8 fL (ref 79.5–101.0)
MONO#: 0.7 10*3/uL (ref 0.1–0.9)
MONO%: 7.7 % (ref 0.0–14.0)
NEUT%: 57.5 % (ref 38.4–76.8)
NEUTROS ABS: 4.9 10*3/uL (ref 1.5–6.5)
PLATELETS: 270 10*3/uL (ref 145–400)
RBC: 5 10*6/uL (ref 3.70–5.45)
RDW: 12.7 % (ref 11.2–14.5)
WBC: 8.5 10*3/uL (ref 3.9–10.3)
lymph#: 2.8 10*3/uL (ref 0.9–3.3)

## 2015-02-22 NOTE — Progress Notes (Signed)
REFERRING PROVIDER: Cari Caraway, MD [place], [place] [address]   G. Magrinat, MD  PRIMARY PROVIDER:  Cari Caraway, MD  PRIMARY REASON FOR VISIT:  1. Breast cancer, left   2. Family history of breast cancer   3. Family history of stomach cancer   4. Family history of ovarian cancer   5. Family history of colon cancer      HISTORY OF PRESENT ILLNESS:   Catherine Lawson, a 59 y.o. female, was seen for a [place] cancer genetics consultation at the request of Dr. Jana Hakim due to a personal and family history of cancer.  Catherine Lawson presents to clinic today to discuss the possibility of a hereditary predisposition to cancer, genetic testing, and to further clarify her future cancer risks, as well as potential cancer risks for family members.   In July 2016, at the age of 58, Catherine Lawson was diagnosed with invasive ductal carcinoma of the right breast.  This will be treated with antiestrogen therapy and surgery.  Surgery will be determine by the genetic testing.  Depending on the oncotype will depend on whether she gets chemotherapy, and depending on the surgery will depend on radiation.    CANCER HISTORY:    Breast cancer of upper-outer quadrant of right female breast   02/06/2015 Initial Diagnosis Breast cancer of upper-outer quadrant of right female breast     HORMONAL RISK FACTORS:  Menarche was at age 53.  First live birth at age N/A.  OCP use for approximately 20+ years.  Ovaries intact: yes.  Hysterectomy: no.  Menopausal status: postmenopausal.  HRT use: 0 years. Colonoscopy: yes; normal. Mammogram within the last year: yes. Number of breast biopsies: 1. Up to date with pelvic exams:  yes. Any excessive radiation exposure in the past:  no  Past Medical History  Diagnosis Date  . Breast cancer of upper-outer quadrant of right female breast 02/06/2015  . Breast cancer   . Family history of breast cancer   . Family history of colon cancer   . Family history  of ovarian cancer   . Family history of stomach cancer     History reviewed. No pertinent past surgical history.  Social History   Social History  . Marital Status: Married    Spouse Name: Event organiser  . Number of Children: 0  . Years of Education: N/A   Social History Main Topics  . Smoking status: Never Smoker   . Smokeless tobacco: None  . Alcohol Use: Yes  . Drug Use: No  . Sexual Activity: Not Asked   Other Topics Concern  . None   Social History Narrative     FAMILY HISTORY:  We obtained a detailed, 4-generation family history.  Significant diagnoses are listed below: Family History  Problem Relation Age of Onset  . Breast cancer Sister 35  . Colon cancer Father 22  . Cancer Maternal Grandmother     probable ovarian cancer  . Stomach cancer Paternal Grandmother 14  . Brain cancer Other 10    Medulloblastoma  . Parkinson's disease Mother   . Lung cancer Paternal Uncle     smoker  . Stomach cancer Other     PGMs sister  . Stomach cancer Other     PMGs brother   The patient does not have children.  She is one of 10 siblings.  Her oldest sister was diagnosed with breast cancer at 35 and died at 44.  One brother had a son who was diagnosed  with a medulloblastoma at age 62 and died at 76.  Her father was diagnosed with colon cancer at 70.  He had three brothers and one sister.  One brother had lung cancer from smoking.  His mother, along with her sister and brother, were all diagnosed with stomach cancer.  Her mother died from complications of parkinson's disease.  She was an only child.  Her maternal grandmother died in her 15s from probable ovarian cancer.  Her grandfather was hit by a car.  Patient's maternal ancestors are of [place] and [place] descent, and paternal ancestors are of [place] descent. There is no reported Ashkenazi Jewish ancestry. There is no known consanguinity.  GENETIC COUNSELING ASSESSMENT: Catherine Lawson is a 59 y.o. female with a personal history of  breast cancer and family history of breast, colon, stomach and ovarian cancer which somewhat suggestive of a hereditary cancer syndrome and predisposition to cancer. We, therefore, discussed and recommended the following at today's visit.   DISCUSSION: We discussed that the most common cause of breast and ovarian cancer is the result of a BRCA mutation.  Based on her father's side of the family, we could also be concerned about Lynch syndrome.  In some instances, Medulloblastoma's are the result of MMR mutations.  Medulloblastoma's can also be associated with mutations in other genes, some are breast cancer genes and others are not.  We reviewed the characteristics, features and inheritance patterns of hereditary cancer syndromes. We also discussed genetic testing, including the appropriate family members to test, the process of testing, insurance coverage and turn-around-time for results. We discussed the implications of a negative, positive and/or variant of uncertain significant result. Catherine Lawson is not interested in adding genes associated with brain tumors, that not associated with breast/ovarian/lynch syndrome genes because there are no other family members with brain tumors/cancer.  We recommended Catherine Lawson pursue genetic testing for the Breast/Ovarian gene panel. The Breast/Ovarian gene panel offered by [facility] includes sequencing and rearrangement analysis for the following 20 genes:  ATM, BARD1, BRCA1, BRCA2, BRIP1, CDH1, CHEK2, EPCAM, FANCC, MLH1, MSH2, MSH6, NBN, PALB2, PMS2, PTEN, RAD51C, RAD51D, TP53, and XRCC2.     Based on Catherine Lawson's personal and family history of cancer, she meets medical criteria for genetic testing. Despite that she meets criteria, she may still have an out of pocket cost. We discussed that if her out of pocket cost for testing is over $100, the laboratory will call and confirm whether she wants to proceed with testing.  If the out of pocket cost of testing is less than $100  she will be billed by the genetic testing laboratory.   PLAN: After considering the risks, benefits, and limitations, Catherine Lawson  provided informed consent to pursue genetic testing and the blood sample was sent to [facility] for analysis of the Breast/Ovarian cancer gene panel. Results should be available within approximately 2-3 weeks' time, at which point they will be disclosed by telephone to Catherine Lawson, as will any additional recommendations warranted by these results. Catherine Lawson will receive a summary of her genetic counseling visit and a copy of her results once available. This information will also be available in Epic. We encouraged Catherine Lawson to remain in contact with cancer genetics annually so that we can continuously update the family history and inform her of any changes in cancer genetics and testing that may be of benefit for her family. Catherine Lawson questions were answered to her satisfaction today. Our contact information was provided  should additional questions or concerns arise.  Lastly, we encouraged Catherine Lawson to remain in contact with cancer genetics annually so that we can continuously update the family history and inform her of any changes in cancer genetics and testing that may be of benefit for this family.   Ms.  Lawson questions were answered to her satisfaction today. Our contact information was provided should additional questions or concerns arise. Thank you for the referral and allowing [place] to share in the care of your patient.   Karen P. Florene Glen, [place], [facility] Certified Genetic Counselor [email or web address] phone: [phone]  The patient was seen for a total of 60 minutes in face-to-face genetic counseling.  This patient was discussed with Drs. Magrinat, Lindi Adie and/or Burr Medico who agrees with the above.    _______________________________________________________________________ For Office Staff:  Number of people involved in session: 2 Was an Intern/ student  involved with case: no

## 2015-03-06 ENCOUNTER — Encounter: Payer: Self-pay | Admitting: Genetic Counselor

## 2015-03-06 ENCOUNTER — Telehealth: Payer: Self-pay | Admitting: Genetic Counselor

## 2015-03-06 ENCOUNTER — Ambulatory Visit: Payer: Self-pay | Admitting: Genetic Counselor

## 2015-03-06 DIAGNOSIS — Z1379 Encounter for other screening for genetic and chromosomal anomalies: Secondary | ICD-10-CM

## 2015-03-06 DIAGNOSIS — Z803 Family history of malignant neoplasm of breast: Secondary | ICD-10-CM

## 2015-03-06 DIAGNOSIS — Z800 Family history of malignant neoplasm of digestive organs: Secondary | ICD-10-CM

## 2015-03-06 DIAGNOSIS — Z8041 Family history of malignant neoplasm of ovary: Secondary | ICD-10-CM

## 2015-03-06 DIAGNOSIS — C50411 Malignant neoplasm of upper-outer quadrant of right female breast: Secondary | ICD-10-CM

## 2015-03-06 NOTE — Telephone Encounter (Signed)
Revealed negative genetic testing. However, Did find a BRCA2 VUS.

## 2015-03-06 NOTE — Telephone Encounter (Signed)
LM on VM with good news.  Gave CB instructions.

## 2015-03-06 NOTE — Progress Notes (Signed)
HPI: Catherine Lawson was previously seen in the [place] clinic due to a personal and family history of cancer and concerns regarding a hereditary predisposition to cancer. Please refer to our prior cancer genetics clinic note for more information regarding Catherine Lawson's medical, social and family histories, and our assessment and recommendations, at the time. Catherine Lawson recent genetic test results were disclosed to her, as were recommendations warranted by these results. These results and recommendations are discussed in more detail below.  FAMILY HISTORY:  We obtained a detailed, 4-generation family history.  Significant diagnoses are listed below: Family History  Problem Relation Age of Onset  . Breast cancer Sister 40  . Colon cancer Father 30  . Cancer Maternal Grandmother     probable ovarian cancer  . Stomach cancer Paternal Grandmother 74  . Brain cancer Other 10    Medulloblastoma  . Parkinson's disease Mother   . Lung cancer Paternal Uncle     smoker  . Stomach cancer Other     PGMs sister  . Stomach cancer Other     PMGs brother    GENETIC TEST RESULTS: At the time of Catherine Lawson visit, we recommended she pursue genetic testing of the Breast/Ovarian cancer gene panel. The Breast/Ovarian gene panel offered by [facility] includes sequencing and rearrangement analysis for the following 20 genes:  ATM, BARD1, BRCA1, BRCA2, BRIP1, CDH1, CHEK2, EPCAM, FANCC, MLH1, MSH2, MSH6, NBN, PALB2, PMS2, PTEN, RAD51C, RAD51D, TP53, and XRCC2.   The report date is March 06, 2015.  Genetic testing was normal, and did not reveal a deleterious mutation in these genes. The test report has been scanned into EPIC and is located under the Media tab.   We discussed with Catherine Lawson that since the current genetic testing is not perfect, it is possible there may be a gene mutation in one of these genes that current testing cannot detect, but that chance is small. We also discussed, that it is  possible that another gene that has not yet been discovered, or that we have not yet tested, is responsible for the cancer diagnoses in the family, and it is, therefore, important to remain in touch with cancer genetics in the future so that we can continue to offer Catherine Lawson the most up to date genetic testing.   Genetic testing did detect a Variant of Unknown Significance in the BRCA2 gene called [id number]. At this time, it is unknown if this variant is associated with increased cancer risk or if this is a normal finding, but most variants such as this get reclassified to being inconsequential. It should not be used to make medical management decisions. This is not a clinically actionable result.  With time, we suspect the lab will determine the significance of this variant, if any. If we do learn more about it, we will try to contact Catherine Lawson to discuss it further. However, it is important to stay in touch with [place] periodically and keep the address and phone number up to date.   CANCER SCREENING RECOMMENDATIONS: This result is reassuring and indicates that Catherine Lawson likely does not have an increased risk for a future cancer due to a mutation in one of these genes. This normal test also suggests that Catherine Lawson cancer was most likely not due to an inherited predisposition associated with one of these genes.  Most cancers happen by chance and this negative test suggests that her cancer falls into this category.  We, therefore, recommended she  continue to follow the cancer management and screening guidelines provided by her oncology and primary healthcare provider.   RECOMMENDATIONS FOR FAMILY MEMBERS: Women in this family might be at some increased risk of developing cancer, over the general population risk, simply due to the family history of cancer. We recommended women in this family have a yearly mammogram beginning at age 54, or 40 years younger than the earliest onset of cancer, an an annual clinical  breast exam, and perform monthly breast self-exams. Women in this family should also have a gynecological exam as recommended by their primary provider. All family members should have a colonoscopy by age 9.  FOLLOW-UP: Lastly, we discussed with Catherine Lawson that cancer genetics is a rapidly advancing field and it is possible that new genetic tests will be appropriate for her and/or her family members in the future. We encouraged her to remain in contact with cancer genetics on an annual basis so we can update her personal and family histories and let her know of advances in cancer genetics that may benefit this family.   Our contact number was provided. Catherine Lawson questions were answered to her satisfaction, and she knows she is welcome to call us at anytime with additional questions or concerns.   Roma Kayser, MS, [facility] Certified Genetic Counselor [email or web address]

## 2015-03-14 ENCOUNTER — Telehealth: Payer: Self-pay | Admitting: Genetic Counselor

## 2015-03-14 NOTE — Telephone Encounter (Signed)
She received a letter from her insurance stating that they will not cover genetic testing.  I gave her [facility]'s phone number to call to determine her OOP cost.

## 2015-03-15 ENCOUNTER — Other Ambulatory Visit: Payer: Self-pay

## 2015-03-15 DIAGNOSIS — R928 Other abnormal and inconclusive findings on diagnostic imaging of breast: Secondary | ICD-10-CM

## 2015-03-17 ENCOUNTER — Encounter (HOSPITAL_COMMUNITY): Payer: Self-pay

## 2015-03-28 ENCOUNTER — Other Ambulatory Visit: Payer: Self-pay | Admitting: Radiology

## 2015-04-04 ENCOUNTER — Other Ambulatory Visit: Payer: Self-pay | Admitting: Oncology

## 2015-04-04 ENCOUNTER — Other Ambulatory Visit: Payer: Self-pay

## 2015-04-04 DIAGNOSIS — C50411 Malignant neoplasm of upper-outer quadrant of right female breast: Secondary | ICD-10-CM

## 2015-04-04 DIAGNOSIS — I471 Supraventricular tachycardia, unspecified: Secondary | ICD-10-CM

## 2015-04-04 DIAGNOSIS — C50912 Malignant neoplasm of unspecified site of left female breast: Secondary | ICD-10-CM

## 2015-04-05 NOTE — Progress Notes (Signed)
Received bone density test from [facility].  Dr. Jana Hakim to review, report will be sent to scanning.

## 2015-04-26 ENCOUNTER — Other Ambulatory Visit: Payer: Self-pay | Admitting: General Surgery

## 2015-04-26 DIAGNOSIS — C50411 Malignant neoplasm of upper-outer quadrant of right female breast: Secondary | ICD-10-CM

## 2015-04-28 ENCOUNTER — Other Ambulatory Visit: Payer: Self-pay | Admitting: Oncology

## 2015-05-01 ENCOUNTER — Inpatient Hospital Stay: Admission: RE | Admit: 2015-05-01 | Payer: Managed Care, Other (non HMO) | Source: Ambulatory Visit

## 2015-05-02 ENCOUNTER — Other Ambulatory Visit: Payer: Self-pay | Admitting: Oncology

## 2015-05-03 ENCOUNTER — Other Ambulatory Visit: Payer: Managed Care, Other (non HMO)

## 2015-05-08 ENCOUNTER — Ambulatory Visit
Admission: RE | Admit: 2015-05-08 | Discharge: 2015-05-08 | Disposition: A | Discharge status: Home / Self Care | Payer: Managed Care, Other (non HMO) | Source: Ambulatory Visit | Attending: Oncology | Admitting: Oncology

## 2015-05-08 ENCOUNTER — Other Ambulatory Visit (HOSPITAL_BASED_OUTPATIENT_CLINIC_OR_DEPARTMENT_OTHER): Payer: Managed Care, Other (non HMO)

## 2015-05-08 DIAGNOSIS — C50912 Malignant neoplasm of unspecified site of left female breast: Secondary | ICD-10-CM | POA: Diagnosis not present

## 2015-05-08 DIAGNOSIS — I471 Supraventricular tachycardia, unspecified: Secondary | ICD-10-CM

## 2015-05-08 DIAGNOSIS — C50411 Malignant neoplasm of upper-outer quadrant of right female breast: Secondary | ICD-10-CM

## 2015-05-08 LAB — COMPREHENSIVE METABOLIC PANEL (CC13)
ALK PHOS: 113 U/L (ref 40–150)
ALT: 23 U/L (ref 0–55)
AST: 24 U/L (ref 5–34)
Albumin: 4.1 g/dL (ref 3.5–5.0)
Anion Gap: 8 mEq/L (ref 3–11)
BUN: 16.7 mg/dL (ref 7.0–26.0)
CALCIUM: 9.7 mg/dL (ref 8.4–10.4)
CHLORIDE: 107 meq/L (ref 98–109)
CO2: 29 mEq/L (ref 22–29)
Creatinine: 0.9 mg/dL (ref 0.6–1.1)
EGFR: 70 mL/min/{1.73_m2} — AB (ref >90)
GLUCOSE: 91 mg/dL (ref 70–140)
POTASSIUM: 4 meq/L (ref 3.5–5.1)
SODIUM: 143 meq/L (ref 136–145)
Total Bilirubin: 0.49 mg/dL (ref 0.20–1.20)
Total Protein: 7 g/dL (ref 6.4–8.3)

## 2015-05-08 LAB — CBC WITH DIFFERENTIAL/PLATELET
BASO%: 0.9 % (ref 0.0–2.0)
BASOS ABS: 0.1 10*3/uL (ref 0.0–0.1)
EOS%: 1.5 % (ref 0.0–7.0)
Eosinophils Absolute: 0.1 10*3/uL (ref 0.0–0.5)
HEMATOCRIT: 43 % (ref 34.8–46.6)
HGB: 14.5 g/dL (ref 11.6–15.9)
LYMPH#: 2.7 10*3/uL (ref 0.9–3.3)
LYMPH%: 35.8 % (ref 14.0–49.7)
MCH: 29.9 pg (ref 25.1–34.0)
MCHC: 33.7 g/dL (ref 31.5–36.0)
MCV: 88.6 fL (ref 79.5–101.0)
MONO#: 0.5 10*3/uL (ref 0.1–0.9)
MONO%: 7 % (ref 0.0–14.0)
NEUT#: 4.1 10*3/uL (ref 1.5–6.5)
NEUT%: 54.8 % (ref 38.4–76.8)
Platelets: 301 10*3/uL (ref 145–400)
RBC: 4.85 10*6/uL (ref 3.70–5.45)
RDW: 13.1 % (ref 11.2–14.5)
WBC: 7.5 10*3/uL (ref 3.9–10.3)

## 2015-05-08 MED ORDER — GADOBENATE DIMEGLUMINE 529 MG/ML IV SOLN: 13.0000 mL | Freq: Once | INTRAVENOUS | Status: DC | PRN

## 2015-05-09 LAB — VITAMIN D 25 HYDROXY (VIT D DEFICIENCY, FRACTURES): VIT D 25 HYDROXY: 47 ng/mL (ref 30–100)

## 2015-05-15 ENCOUNTER — Ambulatory Visit (HOSPITAL_BASED_OUTPATIENT_CLINIC_OR_DEPARTMENT_OTHER): Payer: Managed Care, Other (non HMO) | Admitting: Oncology

## 2015-05-15 VITALS — BP 109/74 | HR 80 | Temp 98.1°F | Resp 18 | Ht 65.0 in | Wt 147.5 lb

## 2015-05-15 DIAGNOSIS — Z170 Estrogen receptor positive status [ER+]: Secondary | ICD-10-CM | POA: Diagnosis not present

## 2015-05-15 DIAGNOSIS — C50411 Malignant neoplasm of upper-outer quadrant of right female breast: Secondary | ICD-10-CM

## 2015-05-15 DIAGNOSIS — Z79811 Long term (current) use of aromatase inhibitors: Secondary | ICD-10-CM

## 2015-05-15 NOTE — Progress Notes (Signed)
[place]  [phone] Fax:([phone]) [phone]     ID: Catherine Lawson DOB: 11-17-1955  MR#: [id number]  OMV#:[id number]  Patient Care Team: Cari Caraway, MD as PCP - General (Family Medicine) Excell Seltzer, MD as Consulting Physician (General Surgery) Chauncey Cruel, MD as Consulting Physician (Oncology) Kyung Rudd, MD as Consulting Physician (Radiation Oncology) Mauro Kaufmann, RN as Registered Nurse Rockwell Germany, RN as Registered Nurse Sylvan Cheese, NP as Nurse Practitioner (Nurse Practitioner) Vania Rea, MD as Consulting Physician (Obstetrics and Gynecology) PCP: Cari Caraway, MD OTHER MD:  CHIEF COMPLAINT: Estrogen receptor positive breast cancer  CURRENT TREATMENT: Neoadjuvant anastrozole   BREAST CANCER HISTORY: From the original intake note:    "Catherine Lawson" had routine screening mammography at [facility] 01/24/2015. This showed a 1.6 cm mass in the right breast at the 11:00 position, and on 0.8 cm irregular density also at the 11:00 position farther away from the nipple. On 02/01/2015 she underwent a unilateral right diagnostic mammography at [facility]. The breast density was category B. In addition to the 2 masses previously noted there was a cluster of calcifications at the 9:00 position. Ultrasonography the same day showed a 1.9 cm oval mass, which was hypoechoic with no vascularity and hard on L a Stogner if he, a 7 mm mass at the 10:00 position with no vascularity and intermediate L a Stogner 3, and a lymph node with uniform cortex thickening in the right axilla. The 2 masses in question were separated by 2 cm.  On 02/02/2015 the patient underwent biopsy of what appears to have been the larger of the 2 breast masses (I cannot locate the biopsy report). This documented invasive ductal carcinoma, grade 2 or 3, with micropapillary features, estrogen and progesterone receptor positive, with an MIB-1 of 40%, and HER-2 not amplified with a signals  ratio of 1.36 and a number per cell of 3.60. There was evidence of lymphovascular invasion.  Because of bleeding from the first biopsy, the second breast mass could not be performed. The lymph node was aspirated, not cord, and this appeared benign but the concordance is questionable.  The patient's subsequent history is as detailed below  INTERVAL HISTORY: Catherine Lawson returns today for follow-up of her breast cancer. She has been on anastrozole for last 3 months, and the repeat MRI shows an excellent response of the larger, more anterior tumor, which was the one biopsy 8. It has nearly resolved.  Interestingly, the more posterior, smaller mass, which we have not biopsied, appears to have grown by approximately 3 mm according to the MRI. We don't see any other areas of concern.  She is obtaining the anastrozole at no cost currently and she has minimal hot flashes, no vaginal dryness or other symptoms related to it.  REVIEW OF SYSTEMS: Catherine Lawson has been extremely busy, with her father dying September 28 in [place] (she was there at the hospice home with him for the final 2 weeks), then visiting her oldest daughter who had a baby, the patient's first grandchild, and then visiting her daughter in [place] who just got engaged. She then married a niece. She is exercising regularly, 5 days a week, about an hour a day. She has rare palpitations which are not changed from previously, and a little bit of arthritis in the thumb joints. Aside from these issues a detailed review of systems today was noncontributory  PAST MEDICAL HISTORY: Past Medical History  Diagnosis Date  . Breast cancer of upper-outer quadrant of right female  breast 02/06/2015  . Breast cancer   . Family history of breast cancer   . Family history of colon cancer   . Family history of ovarian cancer   . Family history of stomach cancer     PAST SURGICAL HISTORY: No past surgical history on file.  FAMILY HISTORY Family History  Problem  Relation Age of Onset  . Breast cancer Sister 46  . Colon cancer Father 78  . Cancer Maternal Grandmother     probable ovarian cancer  . Stomach cancer Paternal Grandmother 54  . Brain cancer Other 10    Medulloblastoma  . Parkinson's disease Mother   . Lung cancer Paternal Uncle     smoker  . Stomach cancer Other     PGMs sister  . Stomach cancer Other     PMGs brother   the patient's father is still living, at age 58. The patient's mother died from Parkinson's disease complications at age 82. The patient had 5 brothers, 4 sisters. One sister was diagnosed with breast cancer at age 64 and died at age 30. The patient's father was diagnosed with colon cancer at the age of 60. A nephew was diagnosed with medulloblastoma at age 67 and died at age 62. The paternal grandmother was diagnosed with stomach cancer at age 58. The maternal grandmother was diagnosed with metastatic cancer of unknown type at age 20. (The description is suggestive of an ovarian cancer).  GYNECOLOGIC HISTORY:  No LMP recorded. Patient is postmenopausal. Menarche age 32. The patient is GX P0. She stopped having periods in 2014. She did not take hormone replacement. She took oral contraceptives for approximately 20 years with no complications.  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 Her husband Catherine Lawson works for a Merchandiser, retail in [employer] "Catherine Lawson and Catherine Lawson credited Warehouse manager". Catherine Lawson has 2 daughters from an earlier marriage, Catherine Lawson lives in [place] and works in recruiting, and Catherine Lawson lives in [place] and works in Press photographer. They are expecting their first grandchild September of this year. The patient attends the [employer] [employer]   HEALTH MAINTENANCE: Social History  Substance Use Topics  . Smoking status: Never Smoker   . Smokeless tobacco: Not on file  . Alcohol Use: Yes     Colonoscopy: 2014/lobe are  PAP:  2015  Bone density: Remote  Lipid panel:  No Known Allergies  Current Outpatient Prescriptions  Medication Sig Dispense Refill  . anastrozole (ARIMIDEX) 1 MG tablet Take 1 tablet (1 mg total) by mouth daily. 90 tablet 4  . cetirizine (ZYRTEC) 10 MG tablet Take 10 mg by mouth daily.    . halobetasol (ULTRAVATE) 0.05 % ointment Apply topically as needed.     No current facility-administered medications for this visit.    OBJECTIVE: Middle-aged white woman who appears well Filed Vitals:   05/15/15 0920  BP: 109/74  Pulse: 80  Temp: 98.1 F (36.7 C)  Resp: 18     Body mass index is 24.55 kg/(m^2).    ECOG FS:0 - Asymptomatic  Sclerae unicteric, pupils round and equal Oropharynx clear and moist-- no thrush or other lesions No cervical or supraclavicular adenopathy Lungs no rales or rhonchi Heart regular rate and rhythm Abd soft, nontender, positive bowel sounds MSK no focal spinal tenderness, no upper extremity lymphedema Neuro: nonfocal, well oriented, appropriate affect Breasts: The right breast is status post biopsy. I do not palpate a  mass. There is no skin change of concern. The right axilla is benign. The left breast is status post benign biopsy. It is otherwise unremarkable   LAB RESULTS:  CMP     Component Value Date/Time   NA 143 05/08/2015 1528   K 4.0 05/08/2015 1528   CO2 29 05/08/2015 1528   GLUCOSE 91 05/08/2015 1528   BUN 16.7 05/08/2015 1528   CREATININE 0.9 05/08/2015 1528   CALCIUM 9.7 05/08/2015 1528   PROT 7.0 05/08/2015 1528   ALBUMIN 4.1 05/08/2015 1528   AST 24 05/08/2015 1528   ALT 23 05/08/2015 1528   ALKPHOS 113 05/08/2015 1528   BILITOT 0.49 05/08/2015 1528    INo results found for: SPEP, UPEP  Lab Results  Component Value Date   WBC 7.5 05/08/2015   NEUTROABS 4.1 05/08/2015   HGB 14.5 05/08/2015   HCT 43.0 05/08/2015   MCV 88.6 05/08/2015   PLT 301 05/08/2015      Chemistry      Component Value Date/Time   NA 143 05/08/2015  1528   K 4.0 05/08/2015 1528   CO2 29 05/08/2015 1528   BUN 16.7 05/08/2015 1528   CREATININE 0.9 05/08/2015 1528      Component Value Date/Time   CALCIUM 9.7 05/08/2015 1528   ALKPHOS 113 05/08/2015 1528   AST 24 05/08/2015 1528   ALT 23 05/08/2015 1528   BILITOT 0.49 05/08/2015 1528       No results found for: LABCA2  No components found for: LABCA125  No results for input(s): INR in the last 168 hours.  Urinalysis No results found for: COLORURINE, APPEARANCEUR, LABSPEC, PHURINE, GLUCOSEU, HGBUR, BILIRUBINUR, KETONESUR, PROTEINUR, UROBILINOGEN, NITRITE, LEUKOCYTESUR  STUDIES: Mr Breast Bilateral W Wo Contrast  05/09/2015  CLINICAL DATA:  The patient was diagnosed with right breast invasive ductal carcinoma on 02/02/2015 ultrasound-guided core biopsy of a mass located within the right breast at the 11 o'clock position 3 cm from the nipple. This was performed at [place]. A second more posteriorly located smaller mass located within the right breast at the 10 o'clock position 4 cm from the nipple could not be biopsied at that time due to patient bleeding. The patient has undergone neoadjuvant therapy. Followup evaluation. LABS:  Not applicable EXAM: BILATERAL BREAST MRI WITH AND WITHOUT CONTRAST TECHNIQUE: Multiplanar, multisequence MR images of both breasts were obtained prior to and following the intravenous administration of 13 ml of MultiHance THREE-DIMENSIONAL MR IMAGE RENDERING ON INDEPENDENT WORKSTATION: Three-dimensional MR images were rendered by post-processing of the original MR data on an independent workstation. The three-dimensional MR images were interpreted, and findings are reported in the following complete MRI report for this study. Three dimensional images were evaluated at the independent DynaCad workstation COMPARISON:  Breast MRI dated 02/20/2015 FINDINGS: Breast composition: b.  Scattered fibroglandular tissue. Background parenchymal enhancement: Mild to  moderate Right breast: The previously seen irregular enhancing mass located within the right breast at the 11 o'clock position anteriorly measuring 2.1 cm in size has markedly decreased in size and no residual measurable mass is seen in this region. Also, the degree of enhancement has significantly improved. The second more posteriorly located smaller mass, however, persists and has mildly increased in size now measuring 1.3 x 1.1 x 0.9 cm in size. Previously this measured 1.0 x 0.9 x 1.0 cm in size. There are no additional concerning areas of enhancement within the right breast. Left breast: The previously seen area of irregular enhancement located within the left  breast at the 9 o'clock position is no longer present on today's study. There are no new worrisome enhancing lesions within the left breast. Lymph nodes: No abnormal appearing lymph nodes. Ancillary findings:  None. IMPRESSION: 1. Considerable decrease in size and enhancement of the irregular enhancing mass located within the right breast at the 11 o'clock position following neoadjuvant therapy. 2. Slight increase in size of the more posteriorly located irregular enhancing mass within the right breast now measuring 1.3 cm in size. 3. The area of irregular enhancement located within the left breast at 9 o'clock position is no longer seen on today's study. RECOMMENDATION: Recommend ultrasound-guided biopsy of the second more posteriorly located mass within the right breast if breast conservation is planned. BI-RADS CATEGORY  4: Suspicious. Electronically Signed   By: Catherine Lawson M.D.   On: 05/09/2015 13:49    ASSESSMENT: 59 y.o. [place] woman status post right breast biopsy 02/02/2015 for a clinically multifocal T1c NX, stage IA invasive ductal carcinoma, grade 2 or 3, estrogen and progesterone receptor positive, with HER-2 not amplified and the MIB-1 at 40%  (1) biopsy of a suspicious left breast mass 03/28/2015 showed only usual ductal  hyperplasia, no malignancy identified. (1) genetics testing The Breast/Ovarian gene panel offered by [facility] includes sequencing and rearrangement analysis for the following 20 genes: ATM, BARD1, BRCA1, BRCA2, BRIP1, CDH1, CHEK2, EPCAM, FANCC, MLH1, MSH2, MSH6, NBN, PALB2, PMS2, PTEN, RAD51C, RAD51D, TP53, and XRCC2. The report date is March 06, 2015. Genetic testing was normal, and did not reveal a deleterious mutation in these genes. The test report has been scanned into EPIC and is located under the Media tab.   (2) awaiting definitive surgery (pending breast MRI and additional biopsies)  (3) Oncotype to be sent if the tumor proves to be node negative; if not positive will proceed to chemotherapy  (4) radiation to follow as appropriate  (5) neoadjuvant anastrozole started 02/08/2015  PLAN: Fraser Din is tolerating the anastrozole remarkably well and she has already had a marked response at the larger tumor in the right breast, which is the one that was biopsied.  The smaller mass may have grown a few millimeters. It certainly appears brighter on the MRI repeat. This could be a triple negative tumor, or a could be something else altogether.  She is going to be meeting with Dr. Excell Seltzer next week to plan the definitive surgery, which is tentatively scheduled for November 14. I expect this will be a lumpectomy, extended to include the smaller, more posterior mass.  We will then send an Oncotype on the estrogen receptor positive mass. If there is a triple negative tumor there we may consider obtaining a Mammaprint on it. In any case we should have a definitive decision regarding adjuvant chemotherapy by the first week in December and that is one I plan to see her again.  I am also requesting an appointment with Dr. Lisbeth Renshaw around the same time so radiation treatments can be operationalized  Fraser Din has a good understanding of this plan. She agrees with it. She will call with any problems that may  develop before her next visit here.  Chauncey Cruel, MD   05/15/2015 9:45 AM Medical Oncology and Hematology [facility] [address] [place],  [address] Tel. [phone]    Fax. [phone]

## 2015-05-16 ENCOUNTER — Telehealth: Payer: Self-pay | Admitting: Oncology

## 2015-05-16 NOTE — Telephone Encounter (Signed)
Appointments made and patient called  Catherine Lawson °

## 2015-05-23 ENCOUNTER — Encounter (HOSPITAL_BASED_OUTPATIENT_CLINIC_OR_DEPARTMENT_OTHER): Payer: Self-pay | Admitting: *Deleted

## 2015-05-28 NOTE — H&P (Signed)
History of Present Illness Marland Kitchen T. Antawan Mchugh MD; 05/22/2015 3:43 PM) The patient is a 59 year old female who presents with breast cancer. She returns for final treatment planning for surgical therapy of recently diagnosed cancer of the right breast. She initially presented in July of this year with history as below:  She is a post menopausal female referred by Dr. Marcelo Baldy for evaluation of recently diagnosed carcinoma of the right breast. She recently presented for a screening mamogram revealing a possible mass and asymmetry in the upper outer right breast.. Subsequent imaging included diagnostic mamogram showing an oval mass wi0h indistinct margins in the right breast at 11:00 anterior depth and ultrasound showing a 1.9 cm oval mass with lobulated margin in the right breast at the 11:00 position as well as a 7 mm mass at the 10:00 position middle depth.. These masses were separated by approximately 2 cm. There was a lymph node in the right axilla showing some cortical thickening suspicious for malignancy. An ultrasound guided breast biopsy was performed on 02/02/2015 of the larger 1.9 cm mass with pathology revealing invasive ductal carcinoma of the breast. there was bleeding from the biopsy site and the smaller mass was not biopsied. The lymph ntion was performed showingl and fine needle aspiration was performed showing scant cellular material with some atypia but not diagnostic for malignancy. She is seen now in breast multidisciplinary clinic for initial treatment planning. She has experienced no breast symptoms, specifically lump, pain, skin changes or nipple discharge. She does not have a personal history of any previous breast problems.  Findings at that time were the following: Tumor size: 1.9 cm with second 0.7 cm mass not biopsied, 2 cm from the biopsied mass Tumor grade: 2-3 Estrogen Receptor: positive Progesterone Receptor: positive Her-2 neu: negative Lymph node status:  negative  We subsequently elected neoadjuvant hormonal therapy and the patient has been on anastrozole since that time. Subsequent follow-up bilateral breast MRI was performed on May 08, 2015. This shows marked improvement in the previously measured 2.1 cm tumor at 11 o'clock position anteriorly which has no residual measurable mass and markedly improved enhancement. The second more posteriorly located smaller mass persists and has enlarged slightly to 1.3 cm from previous 1 cm. No other additional areas of abnormal enhancement in either breast. Lymph nodes appeared normal.  She has not noted any change in her breast. Specifically no lump or skin change or nipple discharge.    Allergies Elbert Ewings, [place]; 05/22/2015 2:56 PM) No Known Drug Allergies11/01/2015  Medication History Elbert Ewings, [place]; 05/22/2015 2:57 PM) Anastrozole (1MG Tablet, Oral) Active. Ultravate (0.05% Ointment, External) Active. ZyrTEC Allergy (10MG Tablet, Oral) Active. Medications Reconciled  Vitals Elbert Ewings CMA; 05/22/2015 2:57 PM) 05/22/2015 2:57 PM Weight: 146 lb Height: 65in Body Surface Area: 1.73 m Body Mass Index: 24.3 kg/m  Temp.: 98.78F(Temporal)  Pulse: 72 (Regular)  BP: 128/68 (Sitting, Left Arm, Standard)       Physical Exam Marland Kitchen T. Tiah Heckel MD; 05/22/2015 3:37 PM) The physical exam findings are as follows: Note:General: Alert, well-developed and well nourished Caucasian female, in no distress Skin: Warm and dry without rash or infection. HEENT: No palpable masses or thyromegaly. Sclera nonicteric. Pupils equal round and reactive. Oropharynx clear. Lymph nodes: No cervical, supraclavicular, or inguinal nodes palpable. Breasts: I cannot feel any masses in either breast. No skin changes. Right nipple is inverted which is chronic. Lungs: Breath sounds clear and equal. No wheezing or increased work of breathing. Cardiovascular: Regular rate and rhythm  without  murmer. No JVD or edema. Peripheral pulses intact. No carotid bruits. Neurologic: Alert and fully oriented. Gait normal. No focal weakness. Psychiatric: Normal mood and affect. Thought content appropriate with normal judgement and insight    Assessment & Plan Marland Kitchen T. Corbett Moulder MD; 05/22/2015 3:46 PM) PRIMARY CANCER OF LOWER OUTER QUADRANT OF RIGHT FEMALE BREAST (C50.511) Impression: 59 year old female with a new diagnosis of cancer of the right breast, upper outer quadrant. Clinical stage IA, ER pos, PR pos, HER-2 neg. after 3 months of neoadjuvant hormonal therapy she has had near-complete regression of the larger 2 cm mass at the 11 o'clock position and possibly slight enlargement of the 1 cm mass which is at the 10 o'clock position a little more posterior and 2 cm away from the biopsied mass. As we had previously discussed I think it is reasonable to proceed with lumpectomy and axillary sentinel lymph node biopsy. I would like to discuss with Dr. Marcelo Baldy the best approach to localization i.e. seed versus wire bracketing. We again discussed the surgical treatment plan and options. I discussed the procedure in detail including risks of bleeding, infection, general anesthetic risks, slight risk of lymphedema and risk of possible need for further surgery based on final pathology findings. All her questions were answered.  Current Plans Schedule for Surgery Localized right breast lumpectomy (radioactive seed versus wire bracketing to be determined) and right axillary sentinel lymph node biopsy under general anesthesia as an outpatient

## 2015-05-29 ENCOUNTER — Ambulatory Visit (HOSPITAL_BASED_OUTPATIENT_CLINIC_OR_DEPARTMENT_OTHER): Payer: Managed Care, Other (non HMO) | Admitting: Certified Registered"

## 2015-05-29 ENCOUNTER — Encounter (HOSPITAL_BASED_OUTPATIENT_CLINIC_OR_DEPARTMENT_OTHER): Payer: Self-pay | Admitting: Certified Registered"

## 2015-05-29 ENCOUNTER — Ambulatory Visit (HOSPITAL_BASED_OUTPATIENT_CLINIC_OR_DEPARTMENT_OTHER)
Admission: RE | Admit: 2015-05-29 | Discharge: 2015-05-29 | Disposition: A | Discharge status: Home / Self Care | Payer: Managed Care, Other (non HMO) | Source: Ambulatory Visit | Attending: General Surgery | Admitting: General Surgery

## 2015-05-29 ENCOUNTER — Encounter (HOSPITAL_BASED_OUTPATIENT_CLINIC_OR_DEPARTMENT_OTHER)
Admission: RE | Disposition: A | Discharge status: Home / Self Care | Payer: Self-pay | Source: Ambulatory Visit | Attending: General Surgery

## 2015-05-29 ENCOUNTER — Encounter (HOSPITAL_COMMUNITY)
Admission: RE | Admit: 2015-05-29 | Discharge: 2015-05-29 | Disposition: A | Discharge status: Home / Self Care | Payer: Managed Care, Other (non HMO) | Source: Ambulatory Visit | Attending: General Surgery | Admitting: General Surgery

## 2015-05-29 DIAGNOSIS — C50911 Malignant neoplasm of unspecified site of right female breast: Secondary | ICD-10-CM | POA: Diagnosis present

## 2015-05-29 DIAGNOSIS — C50411 Malignant neoplasm of upper-outer quadrant of right female breast: Secondary | ICD-10-CM

## 2015-05-29 DIAGNOSIS — M1990 Unspecified osteoarthritis, unspecified site: Secondary | ICD-10-CM | POA: Diagnosis not present

## 2015-05-29 HISTORY — DX: Unspecified osteoarthritis, unspecified site: M19.90

## 2015-05-29 HISTORY — PX: RADIOACTIVE SEED GUIDED PARTIAL MASTECTOMY WITH AXILLARY SENTINEL LYMPH NODE BIOPSY: SHX6520

## 2015-05-29 SURGERY — RADIOACTIVE SEED GUIDED PARTIAL MASTECTOMY WITH AXILLARY SENTINEL LYMPH NODE BIOPSY
Anesthesia: Regional | Laterality: Right

## 2015-05-29 MED ORDER — GLYCOPYRROLATE 0.2 MG/ML IJ SOLN: 0.2000 mg | Freq: Once | INTRAMUSCULAR | Status: DC | PRN

## 2015-05-29 MED ORDER — LIDOCAINE HCL (CARDIAC) 20 MG/ML IV SOLN
INTRAVENOUS | Status: AC
  Filled 2015-05-29: qty 5

## 2015-05-29 MED ORDER — BUPIVACAINE-EPINEPHRINE (PF) 0.5% -1:200000 IJ SOLN
INTRAMUSCULAR | Status: AC
  Filled 2015-05-29: qty 30

## 2015-05-29 MED ORDER — ONDANSETRON HCL 4 MG/2ML IJ SOLN
INTRAMUSCULAR | Status: AC
  Filled 2015-05-29: qty 2

## 2015-05-29 MED ORDER — DEXAMETHASONE SODIUM PHOSPHATE 10 MG/ML IJ SOLN
INTRAMUSCULAR | Status: AC
  Filled 2015-05-29: qty 1

## 2015-05-29 MED ORDER — SCOPOLAMINE 1 MG/3DAYS TD PT72: 1.0000 | MEDICATED_PATCH | Freq: Once | TRANSDERMAL | Status: DC | PRN

## 2015-05-29 MED ORDER — MIDAZOLAM HCL 2 MG/2ML IJ SOLN
1.0000 mg | INTRAMUSCULAR | Status: DC | PRN
  Administered 2015-05-29: 2 mg via INTRAVENOUS
  Administered 2015-05-29: 1 mg via INTRAVENOUS

## 2015-05-29 MED ORDER — CHLORHEXIDINE GLUCONATE 4 % EX LIQD: 1.0000 "application " | Freq: Once | CUTANEOUS | Status: DC

## 2015-05-29 MED ORDER — LIDOCAINE HCL (CARDIAC) 20 MG/ML IV SOLN
INTRAVENOUS | Status: DC | PRN
  Administered 2015-05-29: 30 mg via INTRAVENOUS

## 2015-05-29 MED ORDER — BUPIVACAINE-EPINEPHRINE (PF) 0.5% -1:200000 IJ SOLN
INTRAMUSCULAR | Status: DC | PRN
Start: 2015-05-29 — End: 2015-05-29
  Administered 2015-05-29: 25 mL via PERINEURAL

## 2015-05-29 MED ORDER — CEFAZOLIN SODIUM-DEXTROSE 2-3 GM-% IV SOLR
2.0000 g | INTRAVENOUS | Status: AC
  Administered 2015-05-29: 2 g via INTRAVENOUS

## 2015-05-29 MED ORDER — METHYLENE BLUE 1 % INJ SOLN
INTRAMUSCULAR | Status: AC
  Filled 2015-05-29: qty 10

## 2015-05-29 MED ORDER — FENTANYL CITRATE (PF) 100 MCG/2ML IJ SOLN
INTRAMUSCULAR | Status: AC
  Filled 2015-05-29: qty 4

## 2015-05-29 MED ORDER — SODIUM CHLORIDE 0.9 % IJ SOLN
INTRAMUSCULAR | Status: DC | PRN
  Administered 2015-05-29: 5 mL

## 2015-05-29 MED ORDER — HYDROMORPHONE HCL 1 MG/ML IJ SOLN
0.2500 mg | INTRAMUSCULAR | Status: DC | PRN
  Administered 2015-05-29: 0.5 mg via INTRAVENOUS

## 2015-05-29 MED ORDER — OXYCODONE HCL 5 MG/5ML PO SOLN: 5.0000 mg | Freq: Once | ORAL | Status: AC | PRN

## 2015-05-29 MED ORDER — CEFAZOLIN SODIUM-DEXTROSE 2-3 GM-% IV SOLR
INTRAVENOUS | Status: AC
  Filled 2015-05-29: qty 50

## 2015-05-29 MED ORDER — OXYCODONE HCL 5 MG PO TABS
5.0000 mg | ORAL_TABLET | Freq: Once | ORAL | Status: AC | PRN
  Administered 2015-05-29: 5 mg via ORAL

## 2015-05-29 MED ORDER — PROPOFOL 500 MG/50ML IV EMUL
INTRAVENOUS | Status: AC
  Filled 2015-05-29: qty 50

## 2015-05-29 MED ORDER — MIDAZOLAM HCL 2 MG/2ML IJ SOLN
INTRAMUSCULAR | Status: AC
  Filled 2015-05-29: qty 2

## 2015-05-29 MED ORDER — ONDANSETRON HCL 4 MG/2ML IJ SOLN
INTRAMUSCULAR | Status: DC | PRN
  Administered 2015-05-29: 4 mg via INTRAVENOUS

## 2015-05-29 MED ORDER — SODIUM CHLORIDE 0.9 % IJ SOLN
INTRAMUSCULAR | Status: AC
  Filled 2015-05-29: qty 10

## 2015-05-29 MED ORDER — MIDAZOLAM HCL 2 MG/2ML IJ SOLN
INTRAMUSCULAR | Status: AC
  Filled 2015-05-29: qty 4

## 2015-05-29 MED ORDER — HYDROMORPHONE HCL 1 MG/ML IJ SOLN
INTRAMUSCULAR | Status: AC
  Filled 2015-05-29: qty 1

## 2015-05-29 MED ORDER — FENTANYL CITRATE (PF) 100 MCG/2ML IJ SOLN
50.0000 ug | INTRAMUSCULAR | Status: DC | PRN
  Administered 2015-05-29: 100 ug via INTRAVENOUS
  Administered 2015-05-29: 50 ug via INTRAVENOUS

## 2015-05-29 MED ORDER — LACTATED RINGERS IV SOLN
INTRAVENOUS | Status: DC
  Administered 2015-05-29 (×2): via INTRAVENOUS

## 2015-05-29 MED ORDER — FENTANYL CITRATE (PF) 100 MCG/2ML IJ SOLN
INTRAMUSCULAR | Status: AC
  Filled 2015-05-29: qty 2

## 2015-05-29 MED ORDER — MEPERIDINE HCL 25 MG/ML IJ SOLN: 6.2500 mg | INTRAMUSCULAR | Status: DC | PRN

## 2015-05-29 MED ORDER — HYDROCODONE-ACETAMINOPHEN 5-325 MG PO TABS: 1.0000 | ORAL_TABLET | ORAL | Status: DC | PRN

## 2015-05-29 MED ORDER — TECHNETIUM TC 99M SULFUR COLLOID FILTERED
1.0000 | Freq: Once | INTRAVENOUS | Status: AC | PRN
  Administered 2015-05-29: 1 via INTRADERMAL

## 2015-05-29 MED ORDER — BUPIVACAINE-EPINEPHRINE (PF) 0.5% -1:200000 IJ SOLN
INTRAMUSCULAR | Status: DC | PRN
  Administered 2015-05-29: 10 mL

## 2015-05-29 MED ORDER — PROPOFOL 10 MG/ML IV BOLUS
INTRAVENOUS | Status: DC | PRN
  Administered 2015-05-29: 200 mg via INTRAVENOUS

## 2015-05-29 MED ORDER — OXYCODONE HCL 5 MG PO TABS
ORAL_TABLET | ORAL | Status: AC
  Filled 2015-05-29: qty 1

## 2015-05-29 MED ORDER — DEXAMETHASONE SODIUM PHOSPHATE 4 MG/ML IJ SOLN
INTRAMUSCULAR | Status: DC | PRN
  Administered 2015-05-29: 10 mg via INTRAVENOUS

## 2015-05-29 SURGICAL SUPPLY — 53 items
APPLIER CLIP 9.375 MED OPEN (MISCELLANEOUS) ×2
APR CLP MED 9.3 20 MLT OPN (MISCELLANEOUS) ×1
BINDER BREAST LRG (GAUZE/BANDAGES/DRESSINGS) IMPLANT
BINDER BREAST MEDIUM (GAUZE/BANDAGES/DRESSINGS) ×1 IMPLANT
BINDER BREAST XLRG (GAUZE/BANDAGES/DRESSINGS) IMPLANT
BINDER BREAST XXLRG (GAUZE/BANDAGES/DRESSINGS) IMPLANT
BLADE SURG 15 STRL LF DISP TIS (BLADE) ×1 IMPLANT
BLADE SURG 15 STRL SS (BLADE) ×2
CANISTER SUC SOCK COL 7IN (MISCELLANEOUS) ×1 IMPLANT
CANISTER SUCT 1200ML W/VALVE (MISCELLANEOUS) ×1 IMPLANT
CHLORAPREP W/TINT 26ML (MISCELLANEOUS) ×2 IMPLANT
CLIP APPLIE 9.375 MED OPEN (MISCELLANEOUS) ×1 IMPLANT
COVER BACK TABLE 60X90IN (DRAPES) ×2 IMPLANT
COVER MAYO STAND STRL (DRAPES) ×2 IMPLANT
COVER PROBE W GEL 5X96 (DRAPES) ×2 IMPLANT
DECANTER SPIKE VIAL GLASS SM (MISCELLANEOUS) IMPLANT
DEVICE DUBIN W/COMP PLATE 8390 (MISCELLANEOUS) ×2 IMPLANT
DRAPE LAPAROSCOPIC ABDOMINAL (DRAPES) ×2 IMPLANT
DRAPE UTILITY XL STRL (DRAPES) ×2 IMPLANT
ELECT COATED BLADE 2.86 ST (ELECTRODE) ×2 IMPLANT
ELECT REM PT RETURN 9FT ADLT (ELECTROSURGICAL) ×2
ELECTRODE REM PT RTRN 9FT ADLT (ELECTROSURGICAL) ×1 IMPLANT
GLOVE BIOGEL PI IND STRL 7.0 (GLOVE) IMPLANT
GLOVE BIOGEL PI IND STRL 8 (GLOVE) ×1 IMPLANT
GLOVE BIOGEL PI INDICATOR 7.0 (GLOVE) ×1
GLOVE BIOGEL PI INDICATOR 8 (GLOVE) ×1
GLOVE ECLIPSE 6.5 STRL STRAW (GLOVE) ×1 IMPLANT
GLOVE ECLIPSE 7.5 STRL STRAW (GLOVE) ×2 IMPLANT
GLOVE EXAM NITRILE MD LF STRL (GLOVE) ×1 IMPLANT
GOWN STRL REUS W/ TWL LRG LVL3 (GOWN DISPOSABLE) ×1 IMPLANT
GOWN STRL REUS W/ TWL XL LVL3 (GOWN DISPOSABLE) ×1 IMPLANT
GOWN STRL REUS W/TWL LRG LVL3 (GOWN DISPOSABLE) ×2
GOWN STRL REUS W/TWL XL LVL3 (GOWN DISPOSABLE) ×2
ILLUMINATOR WAVEGUIDE N/F (MISCELLANEOUS) ×1 IMPLANT
KIT MARKER MARGIN INK (KITS) ×2 IMPLANT
LIQUID BAND (GAUZE/BANDAGES/DRESSINGS) ×2 IMPLANT
NDL HYPO 25X1 1.5 SAFETY (NEEDLE) ×2 IMPLANT
NDL SAFETY ECLIPSE 18X1.5 (NEEDLE) ×1 IMPLANT
NEEDLE HYPO 18GX1.5 SHARP (NEEDLE) ×2
NEEDLE HYPO 25X1 1.5 SAFETY (NEEDLE) ×4 IMPLANT
NS IRRIG 1000ML POUR BTL (IV SOLUTION) IMPLANT
PACK BASIN DAY SURGERY FS (CUSTOM PROCEDURE TRAY) ×2 IMPLANT
PENCIL BUTTON HOLSTER BLD 10FT (ELECTRODE) ×2 IMPLANT
SLEEVE SCD COMPRESS KNEE MED (MISCELLANEOUS) ×2 IMPLANT
SPONGE LAP 4X18 X RAY DECT (DISPOSABLE) ×2 IMPLANT
SUT MON AB 5-0 PS2 18 (SUTURE) ×2 IMPLANT
SUT SILK 2 0 SH (SUTURE) IMPLANT
SUT VICRYL 3-0 CR8 SH (SUTURE) ×2 IMPLANT
SYR CONTROL 10ML LL (SYRINGE) ×4 IMPLANT
TOWEL OR 17X24 6PK STRL BLUE (TOWEL DISPOSABLE) ×2 IMPLANT
TOWEL OR NON WOVEN STRL DISP B (DISPOSABLE) ×1 IMPLANT
TUBE CONNECTING 20X1/4 (TUBING) ×1 IMPLANT
YANKAUER SUCT BULB TIP NO VENT (SUCTIONS) ×1 IMPLANT

## 2015-05-29 NOTE — Anesthesia Postprocedure Evaluation (Signed)
  Anesthesia Post-op Note  Patient: Catherine Lawson  Procedure(s) Performed: Procedure(s): RADIOACTIVE SEED RIGHT BREAST LUMPECTOMY WITH AXILLARY SENTINEL LYMPH NODE BIOPSY (Right)  Patient Location: PACU  Anesthesia Type: General, Regional   Level of Consciousness: awake, alert  and oriented  Airway and Oxygen Therapy: Patient Spontanous Breathing  Post-op Pain: mild  Post-op Assessment: Post-op Vital signs reviewed  Post-op Vital Signs: Reviewed  Last Vitals:  Filed Vitals:   05/29/15 1015  BP: 124/66  Pulse: 66  Temp:   Resp: 10    Complications: No apparent anesthesia complications

## 2015-05-29 NOTE — Progress Notes (Signed)
Radiology staff performed nuc med injection. No additional sedation needed. VSS. Husband Acupuncturist) coming from lobby. Will provide update and emot support.

## 2015-05-29 NOTE — Op Note (Signed)
Preoperative Diagnosis: cancer right breast  Postoprative Diagnosis: cancer right breast  Procedure: Procedure(s): BLUE DYE INJECTION RIGHT BREAST, RADIOACTIVE SEED RIGHT BREAST LUMPECTOMY WITH AXILLARY SENTINEL LYMPH NODE BIOPSY   Surgeon: Excell Seltzer T   Assistants: nONE  Anesthesia:  General LMA anesthesia  Indications: Patient is a 59 year old female who presented approximately 4 months ago with a new diagnosis of stage IA ER/PR positive invasive ductal carcinoma of the upper outer right breast. Tumor size was approximately 2 cm with an additional 0.7 cm mass approximately 2 cm posterior and lateral to the known cancer which was unable to be biopsied due to bleeding. She also had a somewhat suspicious axillary lymph node with cortical thickening that on biopsy showed atypia but not diagnostic for malignancy. After initial evaluation and workup the patient was placed on neoadjuvant hormonal therapy. Subsequent MRI has shown significant decreased enhancement in the known cancer and no change in the second mass. No suspicious lymph nodes seen on MRI. We now plan to proceed with initial surgical therapy with radioactive seed localized lumpectomy and axillary sentinel lymph node biopsy. After discussion with radiology we have elected to place the seed between the known cancer in the biopsied mass in order to resect these with a single specimen. She has undergone placement of radioactive seed preoperatively as above.    Procedure Detail:  Patient was seen in the holding area and seed placement confirmed with the neoprobe. Under sedation 1 mCi of technetium sulfur colloid was injected intradermally around the right nipple in the holding area. She underwent a pectoral block by anesthesia. She was then taken to the operating room, placed in supine position on the operating table, and laryngeal mask general anesthesia induced. Under sterile technique after patient timeout injected 5 mL of dilute  methylene blue subcutaneous sleep beneath the right nipple and massage was performed several minutes. The entire right chest and breast, axilla and upper arm were widely sterilely prepped and draped. She received preoperative IV antibiotics. Patient timeout was again performed. The lipectomy was approached initially. I made a curvilinear incision at the areolar border superio laterally And skin and subcutaneous flap was raised superiorly and laterally over the area of high counts. Using the neoprobe for guidance a generous lumpectomy specimen was excised around the area of high counts taking some additional anterior tissue due to the posterior and lateral placement of the seed relative to the biopsy marking clip. The specimen was excised and inked for margins. Specimen mammography showed the seed and the biopsy clip contained within the specimen although the biopsy clip was relatively close to the medial margin. A further medial margin of about 0.5 cm was completely excised and oriented and sent as a separate specimen. Hemostasis was assured. The lumpectomy cavity was marked with clips. Breast tissue was mobilized off the chest wall somewhat superiorly and inferiorly to allow closure and breast and subcutaneous tissue is closed with interrupted 3-0 Vicryl. Attention was turned to the sentinel node biopsy. A hot area in the right axilla was localized and a small transverse incision made and dissection carried down through subcutaneous tissue. The clavipectoral fascia was identified and incised. Using the neoprobe for guidance the axilla was fairly extensively explored. I did not notice any obvious palpable abnormality. The initial hot blue sentinel lymph node excised was slightly enlarged and firm but not obviously involved. Following this for additional hot blue sentinel lymph nodes were removed with overall the notes having counts from 1000 to about 150. An additional  non-sentinel node was also excised. Following  this background counts in the axilla were 0 and there was no palpable adenopathy or visible dye. The deep subcutaneous tenderness tissue in this incision was closed with interrupted 3-0 Vicryl. Skin at both incisions was closed with subcuticular 5-0 Monocryl and Dermabond. Sponge needle and instrument counts were correct.    Findings: As above  Estimated Blood Loss:  Minimal         Drains: none  Blood Given: none          Specimens: #1 right breast lumpectomy   #2 further medial margin, oriented  #3 hot blue right axillary sentinel lymph nodes X 5   #4 non-sentinel right axillary lymph node        Complications:  * No complications entered in OR log *         Disposition: PACU - hemodynamically stable.         Condition: stable

## 2015-05-29 NOTE — Discharge Instructions (Signed)
[facility] °Office Phone Number [phone] ° °BREAST BIOPSY/ PARTIAL MASTECTOMY: POST OP INSTRUCTIONS ° °Always review your discharge instruction sheet given to you by the facility where your surgery was performed. ° °IF YOU HAVE DISABILITY OR FAMILY LEAVE FORMS, YOU MUST BRING THEM TO THE OFFICE FOR PROCESSING.  DO NOT GIVE THEM TO YOUR DOCTOR. ° °1. A prescription for pain medication may be given to you upon discharge.  Take your pain medication as prescribed, if needed.  If narcotic pain medicine is not needed, then you may take acetaminophen (Tylenol) or ibuprofen (Advil) as needed. °2. Take your usually prescribed medications unless otherwise directed °3. If you need a refill on your pain medication, please contact your pharmacy.  They will contact our office to request authorization.  Prescriptions will not be filled after 5pm or on week-ends. °4. You should eat very light the first 24 hours after surgery, such as soup, crackers, pudding, etc.  Resume your normal diet the day after surgery. °5. Most patients will experience some swelling and bruising in the breast.  Ice packs and a good support bra will help.  Swelling and bruising can take several days to resolve.  °6. It is common to experience some constipation if taking pain medication after surgery.  Increasing fluid intake and taking a stool softener will usually help or prevent this problem from occurring.  A mild laxative (Milk of Magnesia or Miralax) should be taken according to package directions if there are no bowel movements after 48 hours. °7. Unless discharge instructions indicate otherwise, you may remove your bandages 24-48 hours after surgery, and you may shower at that time.  You may have steri-strips (small skin tapes) in place directly over the incision.  These strips should be left on the skin for 7-10 days.  If your surgeon used skin glue on the incision, you may shower in 24 hours.  The glue will flake off over the  next 2-3 weeks.  Any sutures or staples will be removed at the office during your follow-up visit. °8. ACTIVITIES:  You may resume regular daily activities (gradually increasing) beginning the next day.  Wearing a good support bra or sports bra minimizes pain and swelling.  You may have sexual intercourse when it is comfortable. °a. You may drive when you no longer are taking prescription pain medication, you can comfortably wear a seatbelt, and you can safely maneuver your car and apply brakes. °b. RETURN TO WORK:  ______________________________________________________________________________________ °9. You should see your doctor in the office for a follow-up appointment approximately two weeks after your surgery.  Your doctor’s nurse will typically make your follow-up appointment when she calls you with your pathology report.  Expect your pathology report 2-3 business days after your surgery.  You may call to check if you do not hear from us after three days. °10. OTHER INSTRUCTIONS: _______________________________________________________________________________________________ _____________________________________________________________________________________________________________________________________ °_____________________________________________________________________________________________________________________________________ °_____________________________________________________________________________________________________________________________________ ° °WHEN TO CALL YOUR DOCTOR: °1. Fever over 101.0 °2. Nausea and/or vomiting. °3. Extreme swelling or bruising. °4. Continued bleeding from incision. °5. Increased pain, redness, or drainage from the incision. ° °The clinic staff is available to answer your questions during regular business hours.  Please don’t hesitate to call and ask to speak to one of the nurses for clinical concerns.  If you have a medical emergency, go to the nearest  emergency room or call 911.  A surgeon from [facility] is always on call at the hospital. ° °For further questions, please visit [email or web address]  ° ° ° °  Post Anesthesia Home Care Instructions ° °Activity: °Get plenty of rest for the remainder of the day. A responsible adult should stay with you for 24 hours following the procedure.  °For the next 24 hours, DO NOT: °-Drive a car °-Operate machinery °-Drink alcoholic beverages °-Take any medication unless instructed by your physician °-Make any legal decisions or sign important papers. ° °Meals: °Start with liquid foods such as gelatin or soup. Progress to regular foods as tolerated. Avoid greasy, spicy, heavy foods. If nausea and/or vomiting occur, drink only clear liquids until the nausea and/or vomiting subsides. Call your physician if vomiting continues. ° °Special Instructions/Symptoms: °Your throat may feel dry or sore from the anesthesia or the breathing tube placed in your throat during surgery. If this causes discomfort, gargle with warm salt water. The discomfort should disappear within 24 hours. ° °If you had a scopolamine patch placed behind your ear for the management of post- operative nausea and/or vomiting: ° °1. The medication in the patch is effective for 72 hours, after which it should be removed.  Wrap patch in a tissue and discard in the trash. Wash hands thoroughly with soap and water. °2. You may remove the patch earlier than 72 hours if you experience unpleasant side effects which may include dry mouth, dizziness or visual disturbances. °3. Avoid touching the patch. Wash your hands with soap and water after contact with the patch. °  ° °

## 2015-05-29 NOTE — Anesthesia Preprocedure Evaluation (Addendum)
Anesthesia Evaluation  Patient identified by MRN, date of birth, ID band Patient awake    Reviewed: Allergy & Precautions, NPO status , Patient's Chart, lab work & pertinent test results  Airway Mallampati: I  TM Distance: >3 FB Neck ROM: Full    Dental  (+) Teeth Intact, Dental Advisory Given   Pulmonary    breath sounds clear to auscultation       Cardiovascular + dysrhythmias Supra Ventricular Tachycardia  Rhythm:Regular Rate:Normal     Neuro/Psych    GI/Hepatic   Endo/Other    Renal/GU      Musculoskeletal  (+) Arthritis ,   Abdominal   Peds  Hematology   Anesthesia Other Findings   Reproductive/Obstetrics                            Anesthesia Physical Anesthesia Plan  ASA: II  Anesthesia Plan: General and Regional   Post-op Pain Management:    Induction: Intravenous  Airway Management Planned: LMA  Additional Equipment:   Intra-op Plan:   Post-operative Plan: Extubation in OR  Informed Consent: I have reviewed the patients History and Physical, chart, labs and discussed the procedure including the risks, benefits and alternatives for the proposed anesthesia with the patient or authorized representative who has indicated his/her understanding and acceptance.   Dental advisory given  Plan Discussed with: CRNA, Anesthesiologist and Surgeon  Anesthesia Plan Comments:         Anesthesia Quick Evaluation

## 2015-05-29 NOTE — Anesthesia Procedure Notes (Addendum)
Anesthesia Regional Block:  Pectoralis block  Pre-Anesthetic Checklist: ,, timeout performed, Correct Patient, Correct Site, Correct Laterality, Correct Procedure, Correct Position, site marked, Risks and benefits discussed,  Surgical consent,  Pre-op evaluation,  At surgeon's request and post-op pain management  Laterality: Right and Upper  Prep: chloraprep       Needles:  Injection technique: Single-shot  Needle Type: Echogenic Needle     Needle Length: 9cm 9 cm Needle Gauge: 21 and 21 G    Additional Needles:  Procedures: ultrasound guided (picture in chart) Pectoralis block Narrative:  Start time: 05/29/2015 7:05 AM End time: 05/29/2015 7:10 AM Injection made incrementally with aspirations every 5 mL.  Performed by: Other  Anesthesiologist: CREWS, DAVID   Procedure Name: LMA Insertion Date/Time: 05/29/2015 7:39 AM Performed by: Del Wiseman D Pre-anesthesia Checklist: Patient identified, Emergency Drugs available, Suction available and Patient being monitored Patient Re-evaluated:Patient Re-evaluated prior to inductionOxygen Delivery Method: Circle System Utilized Preoxygenation: Pre-oxygenation with 100% oxygen Intubation Type: IV induction Ventilation: Mask ventilation without difficulty LMA: LMA inserted LMA Size: 4.0 Number of attempts: 1 Airway Equipment and Method: Bite block Placement Confirmation: positive ETCO2 Tube secured with: Tape Dental Injury: Teeth and Oropharynx as per pre-operative assessment

## 2015-05-29 NOTE — Transfer of Care (Signed)
Immediate Anesthesia Transfer of Care Note  Patient: Catherine Lawson  Procedure(s) Performed: Procedure(s): RADIOACTIVE SEED RIGHT BREAST LUMPECTOMY WITH AXILLARY SENTINEL LYMPH NODE BIOPSY (Right)  Patient Location: PACU  Anesthesia Type:GA combined with regional for post-op pain  Level of Consciousness: awake and patient cooperative  Airway & Oxygen Therapy: Patient Spontanous Breathing and Patient connected to face mask oxygen  Post-op Assessment: Report given to RN and Post -op Vital signs reviewed and stable  Post vital signs: Reviewed and stable  Last Vitals:  Filed Vitals:   05/29/15 0933  BP:   Pulse: 75  Temp: 36.8 C  Resp: 11    Complications: No apparent anesthesia complications

## 2015-05-29 NOTE — Interval H&P Note (Signed)
History and Physical Interval Note:  05/29/2015 7:31 AM  Catherine Lawson  has presented today for surgery, with the diagnosis of Cancer right breast  The various methods of treatment have been discussed with the patient and family. After consideration of risks, benefits and other options for treatment, the patient has consented to  Procedure(s): RADIOACTIVE SEED RIGHT BREAST LUMPECTOMY WITH AXILLARY SENTINEL LYMPH NODE BIOPSY (Right) as a surgical intervention .  The patient's history has been reviewed, patient examined, no change in status, stable for surgery.  I have reviewed the patient's chart and labs.  Questions were answered to the patient's satisfaction.     Shela Esses T

## 2015-05-29 NOTE — Progress Notes (Signed)
Assisted Dr. Crews with right, ultrasound guided, pectoralis block. Side rails up, monitors on throughout procedure. See vital signs in flow sheet. Tolerated Procedure well. 

## 2015-05-30 ENCOUNTER — Encounter (HOSPITAL_BASED_OUTPATIENT_CLINIC_OR_DEPARTMENT_OTHER): Payer: Self-pay | Admitting: General Surgery

## 2015-06-01 ENCOUNTER — Encounter: Payer: Self-pay | Admitting: *Deleted

## 2015-06-01 ENCOUNTER — Other Ambulatory Visit: Payer: Self-pay | Admitting: Oncology

## 2015-06-01 NOTE — Progress Notes (Signed)
Ordered oncotype per Dr. Magrinat.  Faxed requisition to pathology and confirmed receipt.  

## 2015-06-02 ENCOUNTER — Other Ambulatory Visit: Payer: Self-pay | Admitting: General Surgery

## 2015-06-06 ENCOUNTER — Encounter (HOSPITAL_COMMUNITY): Payer: Self-pay | Admitting: *Deleted

## 2015-06-11 ENCOUNTER — Encounter (HOSPITAL_COMMUNITY): Payer: Self-pay | Admitting: Anesthesiology

## 2015-06-11 NOTE — Anesthesia Preprocedure Evaluation (Addendum)
Anesthesia Evaluation  Patient identified by MRN, date of birth, ID band Patient awake    Reviewed: Allergy & Precautions, NPO status , Patient's Chart, lab work & pertinent test results  Airway Mallampati: II  TM Distance: >3 FB Neck ROM: Full    Dental no notable dental hx.    Pulmonary neg pulmonary ROS,    Pulmonary exam normal breath sounds clear to auscultation       Cardiovascular Normal cardiovascular exam+ dysrhythmias Supra Ventricular Tachycardia  Rhythm:Regular Rate:Normal     Neuro/Psych negative neurological ROS  negative psych ROS   GI/Hepatic negative GI ROS, Neg liver ROS,   Endo/Other  negative endocrine ROS  Renal/GU negative Renal ROS  negative genitourinary   Musculoskeletal  (+) Arthritis ,   Abdominal   Peds negative pediatric ROS (+)  Hematology negative hematology ROS (+)   Anesthesia Other Findings   Reproductive/Obstetrics negative OB ROS                            Anesthesia Physical Anesthesia Plan  ASA: II  Anesthesia Plan: General   Post-op Pain Management:    Induction: Intravenous  Airway Management Planned: LMA  Additional Equipment:   Intra-op Plan:   Post-operative Plan: Extubation in OR  Informed Consent: I have reviewed the patients History and Physical, chart, labs and discussed the procedure including the risks, benefits and alternatives for the proposed anesthesia with the patient or authorized representative who has indicated his/her understanding and acceptance.   Dental advisory given  Plan Discussed with: CRNA  Anesthesia Plan Comments:         Anesthesia Quick Evaluation

## 2015-06-12 ENCOUNTER — Ambulatory Visit (HOSPITAL_COMMUNITY): Payer: Managed Care, Other (non HMO) | Admitting: Anesthesiology

## 2015-06-12 ENCOUNTER — Encounter (HOSPITAL_COMMUNITY): Payer: Self-pay | Admitting: *Deleted

## 2015-06-12 ENCOUNTER — Encounter (HOSPITAL_COMMUNITY)
Admission: RE | Disposition: A | Discharge status: Home / Self Care | Payer: Self-pay | Source: Ambulatory Visit | Attending: General Surgery

## 2015-06-12 ENCOUNTER — Ambulatory Visit (HOSPITAL_COMMUNITY)
Admission: RE | Admit: 2015-06-12 | Discharge: 2015-06-12 | Disposition: A | Discharge status: Home / Self Care | Payer: Managed Care, Other (non HMO) | Source: Ambulatory Visit | Attending: General Surgery | Admitting: General Surgery

## 2015-06-12 DIAGNOSIS — C50411 Malignant neoplasm of upper-outer quadrant of right female breast: Secondary | ICD-10-CM | POA: Diagnosis not present

## 2015-06-12 DIAGNOSIS — Z170 Estrogen receptor positive status [ER+]: Secondary | ICD-10-CM | POA: Insufficient documentation

## 2015-06-12 DIAGNOSIS — N6091 Unspecified benign mammary dysplasia of right breast: Secondary | ICD-10-CM | POA: Diagnosis not present

## 2015-06-12 DIAGNOSIS — M1990 Unspecified osteoarthritis, unspecified site: Secondary | ICD-10-CM | POA: Insufficient documentation

## 2015-06-12 DIAGNOSIS — Z79899 Other long term (current) drug therapy: Secondary | ICD-10-CM | POA: Diagnosis not present

## 2015-06-12 HISTORY — PX: RE-EXCISION OF BREAST LUMPECTOMY: SHX6048

## 2015-06-12 LAB — HEMOGLOBIN: Hemoglobin: 13.1 g/dL (ref 12.0–15.0)

## 2015-06-12 SURGERY — EXCISION, LESION, BREAST
Anesthesia: General | Site: Breast | Laterality: Right

## 2015-06-12 MED ORDER — CEFAZOLIN SODIUM-DEXTROSE 2-3 GM-% IV SOLR
2.0000 g | INTRAVENOUS | Status: AC
  Administered 2015-06-12: 2 g via INTRAVENOUS

## 2015-06-12 MED ORDER — FENTANYL CITRATE (PF) 100 MCG/2ML IJ SOLN
INTRAMUSCULAR | Status: AC
  Filled 2015-06-12: qty 2

## 2015-06-12 MED ORDER — PROPOFOL 10 MG/ML IV BOLUS
INTRAVENOUS | Status: DC | PRN
  Administered 2015-06-12: 200 mg via INTRAVENOUS

## 2015-06-12 MED ORDER — DEXAMETHASONE SODIUM PHOSPHATE 10 MG/ML IJ SOLN
INTRAMUSCULAR | Status: AC
  Filled 2015-06-12: qty 1

## 2015-06-12 MED ORDER — ONDANSETRON HCL 4 MG/2ML IJ SOLN
INTRAMUSCULAR | Status: DC | PRN
  Administered 2015-06-12: 4 mg via INTRAVENOUS

## 2015-06-12 MED ORDER — FENTANYL CITRATE (PF) 100 MCG/2ML IJ SOLN: 25.0000 ug | INTRAMUSCULAR | Status: DC | PRN

## 2015-06-12 MED ORDER — MIDAZOLAM HCL 2 MG/2ML IJ SOLN
INTRAMUSCULAR | Status: AC
  Filled 2015-06-12: qty 2

## 2015-06-12 MED ORDER — PROPOFOL 10 MG/ML IV BOLUS
INTRAVENOUS | Status: AC
  Filled 2015-06-12: qty 20

## 2015-06-12 MED ORDER — SODIUM CHLORIDE 0.9 % IJ SOLN
INTRAMUSCULAR | Status: AC
  Filled 2015-06-12: qty 10

## 2015-06-12 MED ORDER — PROMETHAZINE HCL 25 MG/ML IJ SOLN: 6.2500 mg | INTRAMUSCULAR | Status: DC | PRN

## 2015-06-12 MED ORDER — LIDOCAINE HCL (CARDIAC) 20 MG/ML IV SOLN
INTRAVENOUS | Status: AC
  Filled 2015-06-12: qty 5

## 2015-06-12 MED ORDER — ONDANSETRON HCL 4 MG/2ML IJ SOLN
INTRAMUSCULAR | Status: AC
  Filled 2015-06-12: qty 2

## 2015-06-12 MED ORDER — EPHEDRINE SULFATE 50 MG/ML IJ SOLN
INTRAMUSCULAR | Status: AC
  Filled 2015-06-12: qty 1

## 2015-06-12 MED ORDER — LIDOCAINE HCL (CARDIAC) 20 MG/ML IV SOLN
INTRAVENOUS | Status: DC | PRN
  Administered 2015-06-12: 60 mg via INTRAVENOUS

## 2015-06-12 MED ORDER — BUPIVACAINE-EPINEPHRINE 0.25% -1:200000 IJ SOLN
INTRAMUSCULAR | Status: DC | PRN
  Administered 2015-06-12: 30 mL

## 2015-06-12 MED ORDER — BUPIVACAINE-EPINEPHRINE (PF) 0.25% -1:200000 IJ SOLN
INTRAMUSCULAR | Status: AC
  Filled 2015-06-12: qty 30

## 2015-06-12 MED ORDER — FENTANYL CITRATE (PF) 100 MCG/2ML IJ SOLN
INTRAMUSCULAR | Status: DC | PRN
  Administered 2015-06-12 (×2): 25 ug via INTRAVENOUS
  Administered 2015-06-12: 50 ug via INTRAVENOUS

## 2015-06-12 MED ORDER — LACTATED RINGERS IV SOLN
INTRAVENOUS | Status: DC | PRN
  Administered 2015-06-12: 07:00:00 via INTRAVENOUS

## 2015-06-12 MED ORDER — MIDAZOLAM HCL 5 MG/5ML IJ SOLN
INTRAMUSCULAR | Status: DC | PRN
  Administered 2015-06-12: 2 mg via INTRAVENOUS

## 2015-06-12 MED ORDER — 0.9 % SODIUM CHLORIDE (POUR BTL) OPTIME
TOPICAL | Status: DC | PRN
  Administered 2015-06-12: 1000 mL

## 2015-06-12 MED ORDER — DEXAMETHASONE SODIUM PHOSPHATE 10 MG/ML IJ SOLN
INTRAMUSCULAR | Status: DC | PRN
  Administered 2015-06-12: 10 mg via INTRAVENOUS

## 2015-06-12 SURGICAL SUPPLY — 32 items
BINDER BREAST LRG (GAUZE/BANDAGES/DRESSINGS) ×1 IMPLANT
BLADE SURG 15 STRL LF DISP TIS (BLADE) ×2 IMPLANT
BLADE SURG 15 STRL SS (BLADE) ×4
CHLORAPREP W/TINT 26ML (MISCELLANEOUS) ×2 IMPLANT
CLIP TI WIDE RED SMALL 6 (CLIP) ×2 IMPLANT
COVER SURGICAL LIGHT HANDLE (MISCELLANEOUS) ×2 IMPLANT
DECANTER SPIKE VIAL GLASS SM (MISCELLANEOUS) ×2 IMPLANT
DRAPE LAPAROTOMY TRNSV 102X78 (DRAPE) ×2 IMPLANT
DRAPE UTILITY XL STRL (DRAPES) ×2 IMPLANT
ELECT COATED BLADE 2.86 ST (ELECTRODE) ×2 IMPLANT
ELECT PENCIL ROCKER SW 15FT (MISCELLANEOUS) ×2 IMPLANT
ELECT REM PT RETURN 9FT ADLT (ELECTROSURGICAL) ×2
ELECTRODE REM PT RTRN 9FT ADLT (ELECTROSURGICAL) ×1 IMPLANT
GAUZE SPONGE 4X4 12PLY STRL (GAUZE/BANDAGES/DRESSINGS) ×2 IMPLANT
GLOVE BIOGEL PI IND STRL 7.5 (GLOVE) ×1 IMPLANT
GLOVE BIOGEL PI INDICATOR 7.5 (GLOVE) ×2
GLOVE ECLIPSE 7.5 STRL STRAW (GLOVE) ×2 IMPLANT
GOWN STRL REUS W/TWL XL LVL3 (GOWN DISPOSABLE) ×4 IMPLANT
KIT BASIN OR (CUSTOM PROCEDURE TRAY) ×2 IMPLANT
KIT MARKER MARGIN INK (KITS) ×1 IMPLANT
LIQUID BAND (GAUZE/BANDAGES/DRESSINGS) ×1 IMPLANT
NEEDLE HYPO 22GX1.5 SAFETY (NEEDLE) ×2 IMPLANT
PACK BASIC VI WITH GOWN DISP (CUSTOM PROCEDURE TRAY) ×2 IMPLANT
PEN SKIN MARKING BROAD (MISCELLANEOUS) ×1 IMPLANT
SPONGE LAP 18X18 X RAY DECT (DISPOSABLE) ×2 IMPLANT
SUT MNCRL AB 4-0 PS2 18 (SUTURE) ×2 IMPLANT
SUT VIC AB 3-0 SH 18 (SUTURE) ×1 IMPLANT
SUT VIC AB 3-0 SH 27 (SUTURE) ×2
SUT VIC AB 3-0 SH 27XBRD (SUTURE) ×1 IMPLANT
SYR 20CC LL (SYRINGE) ×2 IMPLANT
TOWEL OR 17X26 10 PK STRL BLUE (TOWEL DISPOSABLE) ×2 IMPLANT
YANKAUER SUCT BULB TIP 10FT TU (MISCELLANEOUS) ×2 IMPLANT

## 2015-06-12 NOTE — Anesthesia Postprocedure Evaluation (Signed)
Anesthesia Post Note  Patient: Catherine Lawson  Procedure(s) Performed: Procedure(s) (LRB): RE-EXCISION OF RIGHT  BREAST LUMPECTOMY (Right)  Patient location during evaluation: PACU Anesthesia Type: General Level of consciousness: awake and alert Pain management: pain level controlled Vital Signs Assessment: post-procedure vital signs reviewed and stable Respiratory status: spontaneous breathing, nonlabored ventilation, respiratory function stable and patient connected to nasal cannula oxygen Cardiovascular status: blood pressure returned to baseline and stable Postop Assessment: No signs of nausea or vomiting Anesthetic complications: no    Last Vitals:  Filed Vitals:   06/12/15 0927 06/12/15 1020  BP: 127/62 112/68  Pulse: 75 67  Temp: 36.4 C 36.6 C  Resp: 15 16    Last Pain:  Filed Vitals:   06/12/15 1031  PainSc: 3                  Breyson Kelm J

## 2015-06-12 NOTE — Op Note (Signed)
Preoperative Diagnosis: right breast cancer  Postoprative Diagnosis: right breast cancer  Procedure: Procedure(s): RE-EXCISION OF RIGHT  BREAST LUMPECTOMY   Surgeon: Excell Seltzer T   Assistants: none  Anesthesia:  General LMA anesthesia  Indications: patient is a 59 year old female with a recent diagnosis of stage I cancer of the right breast status post radioactive seed localized lumpectomy and right axillary sentinel lymph node biopsy with multifocal invasive cancer in the upper outer quadrant 2 adjacent masses treated with a single lumpectomy. Final pathology showed focally positive margins for invasive disease at the superior, medial, and posterior margins. After discussion of options and risks including further positive margins we have elected to proceed with reexcision of the superior medial and posterior margins.    Procedure Detail:  Patient was brought to the operating room, placed in supine position on the operating table, and laryngeal mask general anesthesia induced. She received preoperative IV antibiotics. PAS ring placed. The right breast was widely sterilely prepped and draped. Patient timeout was performed and correct procedure verified. The previous circumareolar incision on the right was sharply reopened and previous suture material removed in the subcutaneous and deep breast tissue. A small seroma which was clear was evacuated. All suture material was removed and the previous lumpectomy cavity widely exposed. I identified the previous marking clips on the 3 margins to be reexcised. Using cautery I then completely reexcised the superior and medial margins back approximately 5 mm. The posterior margin was close to the chest wall and I excised the posterior margin to muscle. Each margin was oriented with ink and sent separately for permanent pathology. The wound was thoroughly irrigated and complete hemostasis assured. Deep breast and subcutaneous tissue was closed with  interrupted 3-0 Vicryl. Skin was closed with running subcuticular 4-0 Monocryl and Dermabond. Sponge needle and instrument counts were correct.    Findings: As above  Estimated Blood Loss:  Minimal         Drains: none  Blood Given: none          Specimens: further superior, medial, and posterior margins, inked and sent separately        Complications:  * No complications entered in OR log *         Disposition: PACU - hemodynamically stable.         Condition: stable

## 2015-06-12 NOTE — H&P (Signed)
History of Present Illness Catherine Lawson T. Taft [name] MD; 05/22/2015 3:43 PM) The patient is a 59 year old female who presents with breast cancer.   She is a post menopausal female referred by Dr. Marcelo Baldy for evaluation of recently diagnosed carcinoma of the right breast. She recently presented for a screening mamogram revealing a possible mass and asymmetry in the upper outer right breast.. Subsequent imaging included diagnostic mamogram showing an oval mass wi0h indistinct margins in the right breast at 11:00 anterior depth and ultrasound showing a 1.9 cm oval mass with lobulated margin in the right breast at the 11:00 position as well as a 7 mm mass at the 10:00 position middle depth.. These masses were separated by approximately 2 cm. There was a lymph node in the right axilla showing some cortical thickening suspicious for malignancy. An ultrasound guided breast biopsy was performed on 02/02/2015 of the larger 1.9 cm mass with pathology revealing invasive ductal carcinoma of the breast. there was bleeding from the biopsy site and the smaller mass was not biopsied. The lymph ntion was performed showingl and fine needle aspiration was performed showing scant cellular material with some atypia but not diagnostic for malignancy. She is seen now in breast multidisciplinary clinic for initial treatment planning. She has experienced no breast symptoms, specifically lump, pain, skin changes or nipple discharge. She does not have a personal history of any previous breast problems.  Findings at that time were the following: Tumor size: 1.9 cm with second 0.7 cm mass not biopsied, 2 cm from the biopsied mass Tumor grade: 2-3 Estrogen Receptor: positive Progesterone Receptor: positive Her-2 neu: negative Lymph node status: negative  We subsequently elected neoadjuvant hormonal therapy and the patient has been on anastrozole since that time. Subsequent follow-up bilateral breast MRI was performed on  May 08, 2015. This shows marked improvement in the previously measured 2.1 cm tumor at 11 o'clock position anteriorly which has no residual measurable mass and markedly improved enhancement. The second more posteriorly located smaller mass persists and has enlarged slightly to 1.3 cm from previous 1 cm. No other additional areas of abnormal enhancement in either breast. Lymph nodes appeared normal.  She underwent lumpectomy and negative SLN Bx last week. Lumpectomy showed two foci of Ca and three margins were focally positive.  After discussion we plan to re excise her right breast lumpectomy    Allergies Elbert Ewings, CMA; 05/22/2015 2:56 PM) No Known Drug Allergies11/01/2015  Medication History Elbert Ewings, CMA; 05/22/2015 2:57 PM) Anastrozole (1MG Tablet, Oral) Active. Ultravate (0.05% Ointment, External) Active. ZyrTEC Allergy (10MG Tablet, Oral) Active. Medications Reconciled  Vitals Elbert Ewings CMA; 05/22/2015 2:57 PM) 05/22/2015 2:57 PM Weight: 146 lb Height: 65in Body Surface Area: 1.73 m Body Mass Index: 24.3 kg/m  Temp.: 98.73F(Temporal)  Pulse: 72 (Regular)  BP: 128/68 (Sitting, Left Arm, Standard)       Physical Exam Catherine Lawson T. Gerianne Simonet MD; 05/22/2015 3:37 PM) The physical exam findings are as follows: Note:General: Alert, well-developed and well nourished Caucasian female, in no distress Skin: Warm and dry without rash or infection. HEENT: No palpable masses or thyromegaly. Sclera nonicteric. Pupils equal round and reactive. Oropharynx clear. Lymph nodes: No cervical, supraclavicular, or inguinal nodes palpable. Breasts: I cannot feel any masses in either breast. No skin changes. Right nipple is inverted which is chronic. Lungs: Breath sounds clear and equal. No wheezing or increased work of breathing. Cardiovascular: Regular rate and rhythm without murmer. No JVD or edema. Peripheral pulses intact. No carotid bruits. Neurologic:  Alert and  fully oriented. Gait normal. No focal weakness. Psychiatric: Normal mood and affect. Thought content appropriate with normal judgement and insight    Assessment & Plan Catherine Lawson T. Catherine Zweig MD; 05/22/2015 3:46 PM) PRIMARY CANCER OF LOWER OUTER QUADRANT OF RIGHT FEMALE BREAST (C50.511)  Impression: 59 year old female with  cancer of the right breast, upper outer quadrant. Clinical stage IA, ER pos, PR pos, HER-2 neg. after 3 months of neoadjuvant hormonal therapy and lumpectomy and neg SLN Bx with focally pos margins.  For re excision

## 2015-06-12 NOTE — Discharge Instructions (Signed)
[facility] °Office Phone Number [phone] ° °BREAST BIOPSY/ PARTIAL MASTECTOMY: POST OP INSTRUCTIONS ° °Always review your discharge instruction sheet given to you by the facility where your surgery was performed. ° °IF YOU HAVE DISABILITY OR FAMILY LEAVE FORMS, YOU MUST BRING THEM TO THE OFFICE FOR PROCESSING.  DO NOT GIVE THEM TO YOUR DOCTOR. ° °1. A prescription for pain medication may be given to you upon discharge.  Take your pain medication as prescribed, if needed.  If narcotic pain medicine is not needed, then you may take acetaminophen (Tylenol) or ibuprofen (Advil) as needed. °2. Take your usually prescribed medications unless otherwise directed °3. If you need a refill on your pain medication, please contact your pharmacy.  They will contact our office to request authorization.  Prescriptions will not be filled after 5pm or on week-ends. °4. You should eat very light the first 24 hours after surgery, such as soup, crackers, pudding, etc.  Resume your normal diet the day after surgery. °5. Most patients will experience some swelling and bruising in the breast.  Ice packs and a good support bra will help.  Swelling and bruising can take several days to resolve.  °6. It is common to experience some constipation if taking pain medication after surgery.  Increasing fluid intake and taking a stool softener will usually help or prevent this problem from occurring.  A mild laxative (Milk of Magnesia or Miralax) should be taken according to package directions if there are no bowel movements after 48 hours. °7. Unless discharge instructions indicate otherwise, you may remove your bandages 24-48 hours after surgery, and you may shower at that time.  You may have steri-strips (small skin tapes) in place directly over the incision.  These strips should be left on the skin for 7-10 days.  If your surgeon used skin glue on the incision, you may shower in 24 hours.  The glue will flake off over the  next 2-3 weeks.  Any sutures or staples will be removed at the office during your follow-up visit. °8. ACTIVITIES:  You may resume regular daily activities (gradually increasing) beginning the next day.  Wearing a good support bra or sports bra minimizes pain and swelling.  You may have sexual intercourse when it is comfortable. °a. You may drive when you no longer are taking prescription pain medication, you can comfortably wear a seatbelt, and you can safely maneuver your car and apply brakes. °b. RETURN TO WORK:  ______________________________________________________________________________________ °9. You should see your doctor in the office for a follow-up appointment approximately two weeks after your surgery.  Your doctor’s nurse will typically make your follow-up appointment when she calls you with your pathology report.  Expect your pathology report 2-3 business days after your surgery.  You may call to check if you do not hear from us after three days. °10. OTHER INSTRUCTIONS: _______________________________________________________________________________________________ _____________________________________________________________________________________________________________________________________ °_____________________________________________________________________________________________________________________________________ °_____________________________________________________________________________________________________________________________________ ° °WHEN TO CALL YOUR DOCTOR: °1. Fever over 101.0 °2. Nausea and/or vomiting. °3. Extreme swelling or bruising. °4. Continued bleeding from incision. °5. Increased pain, redness, or drainage from the incision. ° °The clinic staff is available to answer your questions during regular business hours.  Please don’t hesitate to call and ask to speak to one of the nurses for clinical concerns.  If you have a medical emergency, go to the nearest  emergency room or call 911.  A surgeon from [facility] is always on call at the hospital. ° °For further questions, please visit [email or web address]  ° ° °  General Anesthesia, Adult, Care After Refer to this sheet in the next few weeks. These instructions provide you with information on caring for yourself after your procedure. Your health care provider may also give you more specific instructions. Your treatment has been planned according to current medical practices, but problems sometimes occur. Call your health care provider if you have any problems or questions after your procedure. WHAT TO EXPECT AFTER THE PROCEDURE After the procedure, it is typical to experience:  Sleepiness.  Nausea and vomiting. HOME CARE INSTRUCTIONS  For the first 24 hours after general anesthesia:  Have a responsible person with you.  Do not drive a car. If you are alone, do not take public transportation.  Do not drink alcohol.  Do not take medicine that has not been prescribed by your health care provider.  Do not sign important papers or make important decisions.  You may resume a normal diet and activities as directed by your health care provider.  Change bandages (dressings) as directed.  If you have questions or problems that seem related to general anesthesia, call the hospital and ask for the anesthetist or anesthesiologist on call. SEEK MEDICAL CARE IF:  You have nausea and vomiting that continue the day after anesthesia.  You develop a rash. SEEK IMMEDIATE MEDICAL CARE IF:   You have difficulty breathing.  You have chest pain.  You have any allergic problems.   This information is not intended to replace advice given to you by your health care provider. Make sure you discuss any questions you have with your health care provider.   Document Released: 10/07/2000 Document Revised: 07/22/2014 Document Reviewed: 10/30/2011 Elsevier Interactive Patient Education [employer]

## 2015-06-12 NOTE — Interval H&P Note (Signed)
History and Physical Interval Note:  06/12/2015 7:34 AM  Catherine Lawson  has presented today for surgery, with the diagnosis of right breast cancer  The various methods of treatment have been discussed with the patient and family. After consideration of risks, benefits and other options for treatment, the patient has consented to  Procedure(s): RE-EXCISION OF RIGHT  BREAST LUMPECTOMY (Right) as a surgical intervention .  The patient's history has been reviewed, patient examined, no change in status, stable for surgery.  I have reviewed the patient's chart and labs.  Questions were answered to the patient's satisfaction.     Catherine Lawson T

## 2015-06-12 NOTE — Anesthesia Procedure Notes (Signed)
Procedure Name: LMA Insertion Date/Time: 06/12/2015 7:42 AM Performed by: Glory Buff Pre-anesthesia Checklist: Patient identified, Emergency Drugs available, Suction available and Patient being monitored Patient Re-evaluated:Patient Re-evaluated prior to inductionOxygen Delivery Method: Circle system utilized Preoxygenation: Pre-oxygenation with 100% oxygen Intubation Type: IV induction LMA: LMA inserted LMA Size: 4.0 Number of attempts: 1 Placement Confirmation: positive ETCO2 Tube secured with: Tape

## 2015-06-12 NOTE — Progress Notes (Signed)
Location of Breast Cancer: Right Breast Upper Outer Quadrant, [facility] seen 02/08/15  Histology per Pathology Report: 02/02/15: Right Breast  Bx=Invasive Ductal Carcinoma, lymphovascular  invasion Identified  Diagnosis 03/28/15: Breast, left, needle core biopsy, 9:00 o'clock - FIBROCYSTIC CHANGES WITH USUAL DUCTAL HYPERPLASIA.- NO MALIGNANCY IDENTIFIED.   Receptor Status: ER(+), PR (+), Her2-neu (neg. Ki67=40%)  Did patient present with symptoms (if so, please note symptoms) or was this found on screening mammography?: Routine  screening   Past/Anticipated interventions by surgeon, if any: Diagnosis: 06/12/15 Right Breast +positive margins  Superior medial excisions Pending path Diagnosis 06/12/15: Dr. Marland Kitchen [place],MD 1. Breast, lumpectomy, right - MULTIFOCAL INVASIVE DUCTAL CARCINOMA, SEE COMMENT.- INVASIVE TUMOR IS FOCALLY PRESENT AT SUPERIOR, MEDIAL AND POSTERIOR MARGINS.- DUCTAL CARCINOMA IN SITU.- PREVIOUS BIOPSY SITE CHANGE- SEE TUMOR SYNOPTIC TEMPLATE BELOW. 2. Breast, excision, right additional inferior margin - ATYPICAL DUCTAL HYPERPLASIA, SEE COMMENT.- SURGICAL MARGIN, NEGATIVE FOR ATYPIA OR MALIGNANCY. 3. Lymph node, sentinel, biopsy, right axillary #1 - ONE LYMPH NODE, NEGATIVE FOR TUMOR (0/1) 4. Lymph node, sentinel, biopsy, right axillary #2 - ONE LYMPH NODE, NEGATIVE FOR TUMOR (0/1) 5. Lymph node, sentinel, biopsy, right axillary #3 - ONE LYMPH NODE, NEGATIVE FOR TUMOR (0/1) 6. Lymph node, biopsy, right axillary - ONE LYMPH NODE, NEGATIVE FOR TUMOR (0/1) 7. Lymph node, sentinel, biopsy, right axillary #4 - ONE LYMPH NODE, NEGATIVE FOR TUMOR (0/1) 8. Lymph node, sentinel, biopsy, right axillary #5 - ONE LYMPH NODE, NEGATIVE FOR TUMOR (0/1  Past/Anticipated interventions by medical oncology, if any: Chemotherapy : Oncotype ordered by Dr. Jana Hakim seen in [facility] 02/08/15, on Anastrozole  Past 4 months,   Lymphedema issues, if any:   Pain issues, if  any:  SAFETY ISSUES:  Prior radiation? NO  Pacemaker/ICD? NO  Possible current pregnancy? NO Is the patient on methotrexate? NO Current Complaints / other details:  Non smoker, no smokeless tobacco,no illicit drug use, drinks alcohol Maternal grandmother cancer, probable ovarian cancer,Colon cancer in her 28 86,,stomach cancer  Age 20, brain cancer Medulloblastoma age 50, , paternal grandmother Sister breast cancer age 36,Paternal Uncle lung cancer,smoker, paternal grandmothers sister  stomach cancer,Paternal grandmothers brother stomach cancer,   Allergies: NKA     Catherine Lawson, Felicita Gage, RN 06/12/2015,3:36 PM

## 2015-06-12 NOTE — Transfer of Care (Signed)
Immediate Anesthesia Transfer of Care Note  Patient: Catherine Lawson  Procedure(s) Performed: Procedure(s): RE-EXCISION OF RIGHT  BREAST LUMPECTOMY (Right)  Patient Location: PACU  Anesthesia Type:General  Level of Consciousness: awake, alert  and oriented  Airway & Oxygen Therapy: Patient Spontanous Breathing and Patient connected to face mask oxygen  Post-op Assessment: Report given to RN and Post -op Vital signs reviewed and stable  Post vital signs: Reviewed and stable  Last Vitals:  Filed Vitals:   06/12/15 0544  BP: 113/56  Pulse: 74  Temp: 36.7 C  Resp: 16    Complications: No apparent anesthesia complications

## 2015-06-13 ENCOUNTER — Encounter (HOSPITAL_COMMUNITY): Payer: Self-pay | Admitting: General Surgery

## 2015-06-14 ENCOUNTER — Ambulatory Visit
Admission: RE | Admit: 2015-06-14 | Discharge: 2015-06-14 | Disposition: A | Discharge status: Home / Self Care | Payer: Managed Care, Other (non HMO) | Source: Ambulatory Visit | Attending: Radiation Oncology | Admitting: Radiation Oncology

## 2015-06-14 ENCOUNTER — Telehealth: Payer: Self-pay | Admitting: *Deleted

## 2015-06-14 ENCOUNTER — Ambulatory Visit: Payer: Managed Care, Other (non HMO)

## 2015-06-14 DIAGNOSIS — Z800 Family history of malignant neoplasm of digestive organs: Secondary | ICD-10-CM | POA: Insufficient documentation

## 2015-06-14 DIAGNOSIS — C50411 Malignant neoplasm of upper-outer quadrant of right female breast: Secondary | ICD-10-CM | POA: Insufficient documentation

## 2015-06-14 DIAGNOSIS — Z170 Estrogen receptor positive status [ER+]: Secondary | ICD-10-CM | POA: Insufficient documentation

## 2015-06-14 DIAGNOSIS — Z803 Family history of malignant neoplasm of breast: Secondary | ICD-10-CM | POA: Insufficient documentation

## 2015-06-14 NOTE — Telephone Encounter (Signed)
Left message for a return phone call to see if patient would like to keep her appt with Dr. Lisbeth Renshaw today.  Her oncotype results are not ready yet.

## 2015-06-15 ENCOUNTER — Encounter (HOSPITAL_COMMUNITY): Payer: Self-pay

## 2015-06-15 ENCOUNTER — Other Ambulatory Visit: Payer: Self-pay | Admitting: Oncology

## 2015-06-15 ENCOUNTER — Telehealth: Payer: Self-pay | Admitting: *Deleted

## 2015-06-15 NOTE — Progress Notes (Unsigned)
I called Pat with her Oncotype news. We are canceling the appointment with radiation oncology and she will see me [phone] to discuss implications

## 2015-06-15 NOTE — Telephone Encounter (Signed)
Received oncotype score of 34. Copy given to Dr. Jana Hakim, original to HIM for scanning.  Spoke to pt to inform we will cancel her appt with Dr. Lisbeth Renshaw and r/s after completion of chemo. Denies further needs at this time.

## 2015-06-16 ENCOUNTER — Ambulatory Visit: Payer: Managed Care, Other (non HMO) | Admitting: Radiation Oncology

## 2015-06-16 ENCOUNTER — Ambulatory Visit
Admission: RE | Admit: 2015-06-16 | Payer: Managed Care, Other (non HMO) | Source: Ambulatory Visit | Admitting: Radiation Oncology

## 2015-06-20 ENCOUNTER — Ambulatory Visit (HOSPITAL_BASED_OUTPATIENT_CLINIC_OR_DEPARTMENT_OTHER): Payer: Managed Care, Other (non HMO) | Admitting: Oncology

## 2015-06-20 ENCOUNTER — Other Ambulatory Visit: Payer: Self-pay | Admitting: General Surgery

## 2015-06-20 ENCOUNTER — Telehealth: Payer: Self-pay | Admitting: Oncology

## 2015-06-20 VITALS — BP 130/62 | HR 66 | Temp 98.7°F | Resp 18 | Ht 64.0 in | Wt 147.5 lb

## 2015-06-20 DIAGNOSIS — Z170 Estrogen receptor positive status [ER+]: Secondary | ICD-10-CM

## 2015-06-20 DIAGNOSIS — C50411 Malignant neoplasm of upper-outer quadrant of right female breast: Secondary | ICD-10-CM | POA: Diagnosis not present

## 2015-06-20 MED ORDER — ONDANSETRON HCL 8 MG PO TABS: 8.0000 mg | ORAL_TABLET | Freq: Two times a day (BID) | ORAL | Status: DC

## 2015-06-20 MED ORDER — LIDOCAINE-PRILOCAINE 2.5-2.5 % EX CREA: TOPICAL_CREAM | CUTANEOUS | Status: DC

## 2015-06-20 MED ORDER — DEXAMETHASONE 4 MG PO TABS: 8.0000 mg | ORAL_TABLET | Freq: Two times a day (BID) | ORAL | Status: DC

## 2015-06-20 MED ORDER — LORAZEPAM 0.5 MG PO TABS: 0.5000 mg | ORAL_TABLET | Freq: Every evening | ORAL | Status: DC | PRN

## 2015-06-20 MED ORDER — TOBRAMYCIN-DEXAMETHASONE 0.3-0.1 % OP SUSP: 1.0000 [drp] | Freq: Two times a day (BID) | OPHTHALMIC | Status: DC

## 2015-06-20 MED ORDER — PROCHLORPERAZINE MALEATE 10 MG PO TABS: 10.0000 mg | ORAL_TABLET | Freq: Four times a day (QID) | ORAL | Status: DC | PRN

## 2015-06-20 NOTE — Progress Notes (Signed)
[place]  [phone] Fax:([phone]) [phone]     ID: Catherine Lawson DOB: June 06, 1956  MR#: [id number]  XBL#:[id number]  Patient Care Team: Cari Caraway, MD as PCP - General (Family Medicine) Excell Seltzer, MD as Consulting Physician (General Surgery) Chauncey Cruel, MD as Consulting Physician (Oncology) Kyung Rudd, MD as Consulting Physician (Radiation Oncology) Mauro Kaufmann, RN as Registered Nurse Rockwell Germany, RN as Registered Nurse Sylvan Cheese, NP as Nurse Practitioner (Nurse Practitioner) Vania Rea, MD as Consulting Physician (Obstetrics and Gynecology) PCP: Cari Caraway, MD OTHER MD:  CHIEF COMPLAINT: Estrogen receptor positive breast cancer  CURRENT TREATMENT: Adjuvant chemotherapy   BREAST CANCER HISTORY: From the original intake note:     "Catherine Lawson" had routine screening mammography at [facility] 01/24/2015. This showed a 1.6 cm mass in the right breast at the 11:00 position, and on 0.8 cm irregular density also at the 11:00 position farther away from the nipple. On 02/01/2015 she underwent a unilateral right diagnostic mammography at [facility]. The breast density was category B. In addition to the 2 masses previously noted there was a cluster of calcifications at the 9:00 position. Ultrasonography the same day showed a 1.9 cm oval mass, which was hypoechoic with no vascularity and hard on L a Stogner if he, a 7 mm mass at the 10:00 position with no vascularity and intermediate L a Stogner 3, and a lymph node with uniform cortex thickening in the right axilla. The 2 masses in question were separated by 2 cm.  On 02/02/2015 the patient underwent biopsy of what appears to have been the larger of the 2 breast masses (I cannot locate the biopsy report). This documented invasive ductal carcinoma, grade 2 or 3, with micropapillary features, estrogen and progesterone receptor positive, with an MIB-1 of 40%, and HER-2 not amplified with a signals  ratio of 1.36 and a number per cell of 3.60. There was evidence of lymphovascular invasion.  Because of bleeding from the first biopsy, the second breast mass could not be performed. The lymph node was aspirated, not cord, and this appeared benign but the concordance is questionable.  The patient's subsequent history is as detailed below  INTERVAL HISTORY: Catherine Lawson returns today for follow-up of her estrogen receptor positive breast cancer accompanied by her husband Catherine Lawson.. Since her last visit here she underwent right lumpectomy with sentinel lymph node sampling, on 05/29/2015. The final pathology ([id number]) found multifocal invasive ductal carcinoma, the larger of 2 masses being 0.9 cm,, the second site being 0.7 cm, both being grade 2. All 5 sentinel lymph nodes and a non-sentinel lymph node were clear. Margins were positive but cleared with a subsequent resection, 06/12/2015 ([id number]).  The Oncotype result from one of these masses showed a recurrent score of 35, predicting a 10 year risk of recurrence outside the breast of 24% if the patient's only systemic therapy is tamoxifen for 5 years. It also predicts a significant benefit from chemotherapy. The second mass was not tested. We will request that today.  REVIEW OF SYSTEMS: Catherine Lawson has been extremely busy, with her father dying September 28 in [place] (she was there at the hospice home with him for the final 2 weeks), then visiting her oldest daughter who had a baby, the patient's first grandchild, and then visiting her daughter in [place] who just got engaged. She then married a niece. She is exercising regularly, 5 days a week, about an hour a day. She has rare palpitations which are not changed  from previously, and a little bit of arthritis in the thumb joints. Aside from these issues a detailed review of systems today was noncontributory  PAST MEDICAL HISTORY: Past Medical History  Diagnosis Date  . Breast cancer of upper-outer quadrant  of right female breast ([place]) 02/06/2015  . Breast cancer ([place])   . Family history of breast cancer   . Family history of colon cancer   . Family history of ovarian cancer   . Family history of stomach cancer   . Arthritis     bil thumb joints  . Dysrhythmia     fast HR at times due to stress, no chest pain or SOB    PAST SURGICAL HISTORY: Past Surgical History  Procedure Laterality Date  . Wisdom tooth extraction    . Radioactive seed guided mastectomy with axillary sentinel lymph node biopsy Right 05/29/2015    Procedure: RADIOACTIVE SEED RIGHT BREAST LUMPECTOMY WITH AXILLARY SENTINEL LYMPH NODE BIOPSY;  Surgeon: Excell Seltzer, MD;  Location: [facility];  Service: General;  Laterality: Right;  . Re-excision of breast lumpectomy Right 06/12/2015    Procedure: RE-EXCISION OF RIGHT  BREAST LUMPECTOMY;  Surgeon: Excell Seltzer, MD;  Location: [facility];  Service: General;  Laterality: Right;    FAMILY HISTORY Family History  Problem Relation Age of Onset  . Breast cancer Sister 63  . Colon cancer Father 14  . Cancer Maternal Grandmother     probable ovarian cancer  . Stomach cancer Paternal Grandmother 46  . Brain cancer Other 10    Medulloblastoma  . Parkinson's disease Mother   . Lung cancer Paternal Uncle     smoker  . Stomach cancer Other     PGMs sister  . Stomach cancer Other     PMGs brother   the patient's father is still living, at age 22. The patient's mother died from Parkinson's disease complications at age 42. The patient had 5 brothers, 4 sisters. One sister was diagnosed with breast cancer at age 3 and died at age 73. The patient's father was diagnosed with colon cancer at the age of 84. A nephew was diagnosed with medulloblastoma at age 67 and died at age 55. The paternal grandmother was diagnosed with stomach cancer at age 88. The maternal grandmother was diagnosed with metastatic cancer of unknown type at age 51. (The description is suggestive  of an ovarian cancer).  GYNECOLOGIC HISTORY:  No LMP recorded. Patient is postmenopausal. Menarche age 63. The patient is GX P0. She stopped having periods in 2014. She did not take hormone replacement. She took oral contraceptives for approximately 20 years with no complications.  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 Her husband Catherine Lawson works for a Merchandiser, retail in [employer] "Catherine Lawson credited Warehouse manager". Catherine Lawson has 2 daughters from an earlier marriage, Catherine Lawson lives in [place] and works in recruiting, and Catherine Lawson lives in [place] and works in Press photographer. They are expecting their first grandchild September of this year. The patient attends the [employer]   HEALTH MAINTENANCE: Social History  Substance Use Topics  . Smoking status: Never Smoker   . Smokeless tobacco: Not on file  . Alcohol Use: Yes     Comment: social     Colonoscopy: 2014/lobe are  PAP: 2015  Bone density: Remote  Lipid panel:  No Known Allergies  Current Outpatient Prescriptions  Medication Sig Dispense  Refill  . anastrozole (ARIMIDEX) 1 MG tablet Take 1 tablet (1 mg total) by mouth daily. 90 tablet 4  . cetirizine (ZYRTEC) 10 MG tablet Take 10 mg by mouth daily.    . halobetasol (ULTRAVATE) 0.05 % ointment Apply 1 application topically daily as needed (eczema).     Marland Kitchen HYDROcodone-acetaminophen (NORCO/VICODIN) 5-325 MG tablet Take 1-2 tablets by mouth every 4 (four) hours as needed for moderate pain or severe pain. (Patient not taking: Reported on 06/07/2015) 40 tablet 0   No current facility-administered medications for this visit.    OBJECTIVE: Middle-aged white woman who appears well Filed Vitals:   06/20/15 0845  BP: 130/62  Pulse: 66  Temp: 98.7 F (37.1 C)  Resp: 18     Body mass index is 25.31 kg/(m^2).    ECOG FS:0 - Asymptomatic  Sclerae unicteric, pupils round and  equal Oropharynx clear and moist-- no thrush or other lesions No cervical or supraclavicular adenopathy Lungs no rales or rhonchi Heart regular rate and rhythm Abd soft, nontender, positive bowel sounds MSK no focal spinal tenderness, no upper extremity lymphedema Neuro: nonfocal, well oriented, appropriate affect Breasts: The right breast is status post biopsy. I do not palpate a mass. There is no skin change of concern. The right axilla is benign. The left breast is status post benign biopsy. It is otherwise unremarkable   LAB RESULTS:  CMP     Component Value Date/Time   NA 143 05/08/2015 1528   K 4.0 05/08/2015 1528   CO2 29 05/08/2015 1528   GLUCOSE 91 05/08/2015 1528   BUN 16.7 05/08/2015 1528   CREATININE 0.9 05/08/2015 1528   CALCIUM 9.7 05/08/2015 1528   PROT 7.0 05/08/2015 1528   ALBUMIN 4.1 05/08/2015 1528   AST 24 05/08/2015 1528   ALT 23 05/08/2015 1528   ALKPHOS 113 05/08/2015 1528   BILITOT 0.49 05/08/2015 1528    INo results found for: SPEP, UPEP  Lab Results  Component Value Date   WBC 7.5 05/08/2015   NEUTROABS 4.1 05/08/2015   HGB 13.1 06/12/2015   HCT 43.0 05/08/2015   MCV 88.6 05/08/2015   PLT 301 05/08/2015      Chemistry      Component Value Date/Time   NA 143 05/08/2015 1528   K 4.0 05/08/2015 1528   CO2 29 05/08/2015 1528   BUN 16.7 05/08/2015 1528   CREATININE 0.9 05/08/2015 1528      Component Value Date/Time   CALCIUM 9.7 05/08/2015 1528   ALKPHOS 113 05/08/2015 1528   AST 24 05/08/2015 1528   ALT 23 05/08/2015 1528   BILITOT 0.49 05/08/2015 1528       No results found for: LABCA2  No components found for: LABCA125  No results for input(s): INR in the last 168 hours.  Urinalysis No results found for: COLORURINE, APPEARANCEUR, LABSPEC, PHURINE, GLUCOSEU, HGBUR, BILIRUBINUR, KETONESUR, PROTEINUR, UROBILINOGEN, NITRITE, LEUKOCYTESUR  STUDIES: Nm Sentinel Node Inj-no Rpt (breast)  05/29/2015  CLINICAL DATA: cancer right  breast Sulfur colloid was injected intradermally by the nuclear medicine technologist for breast cancer sentinel node localization.    ASSESSMENT: 59 y.o. [place] woman status post right breast biopsy 02/02/2015 for a clinically multifocal T1c NX, stage IA invasive ductal carcinoma, grade 2 or 3, estrogen and progesterone receptor positive, with HER-2 not amplified and the MIB-1 at 40%  (1) biopsy of a suspicious left breast mass 03/28/2015 showed only usual ductal hyperplasia, no malignancy identified.  (2) genetics testing 03/06/2015 through the  Breast/Ovarian gene panel offered by [facility] found no deleterious mutations in ATM, BARD1, BRCA1, BRCA2, BRIP1, CDH1, CHEK2, EPCAM, FANCC, MLH1, MSH2, MSH6, NBN, PALB2, PMS2, PTEN, RAD51C, RAD51D, TP53, and XRCC2.  (a) there was a Variant of Unknown Significance in the BRCA2 gene called [id number]  (3) status post right lumpectomy and sentinel lymph node sampling 05/29/2015 for an mpT1b pN0, stage IA invasive ductal carcinoma, grade 2, with positive margins  (a) margins cleared with subsequent surgery 06/12/2015  (3) Oncotype score of 35 predicts a risk of outside the breast recurrence within 10 years of 24% if the patient's only systemic therapy is tamoxifen for 5 years. It also predicts significant benefit from chemotherapy.  (4) adjuvant chemotherapy to consist of cyclophosphamide and docetaxel 4, given 3 weeks apart, starting 06/27/2015.  (5) adjuvant radiation to follow chemotherapy  (5) received anastrozole  02/08/2015 through December 2016, to resume at the completion of radiation  PLAN: I spent approximately one hour today with Catherine Lawson and Catherine Lawson going over her situation. Anatomically she has a very favorable. Lesions. There is each less than a centimeter, there were no lymph nodes involved, and the one lesion that was tested was estrogen receptor positive.  A prognostic panel was not obtained from a smaller second lesion.. If the lesion is  estrogen receptor positive or triple negative, this would not change our treatment. If it were HER-2 positive however this would change our approach. Accordingly we are going to request a prognostic panel from the second smaller lesion.  It may be difficult to decide which lesion is which but the larger original lesion should have had a biopsy clip in it, whereas the second lesion would not. Note that the radioactive seed was deliberately placed between both lesions to facilitate the lumpectomy finally the Oncotype was obtained from one of the 2 lesions described as 1E but going through the pathology report I am unable to discriminate between the lesions with this information. This is of consequence because the HER-2 was read as equivalent call on the Oncotype report.  Quite aside from all this, Catherine Lawson understands that she has a high-risk although small tumor. Her risk of stage IV disease with 5 years of tamoxifen alone is unacceptably high. If her risk of recurrence is 24%, chemotherapy generally drops that by one third, which would be down to about 16%. It may be a little more than that because a high risk Oncotype score also predicts a better than expected response to chemotherapy. Finally we will try to obtain an extra 2-3 percentages by continuing anastrozole instead of the 5 years of tamoxifen used in the Oncotype database. In this regard its worth pointing out that Catherine Lawson is tolerating the anastrozole quite well, with hot flashes as her only significant side effect  She would like to start chemotherapy as soon as possible and if we can start next week and then hopefully she will do well through Christmas. It may be difficult to get her port scheduled before December 13 but I have sent her surgeon a note with that request.  Today we discussed the possible toxicities side effects and complications of Cytoxan and Taxotere. The plan will be for 4 cycles after which she will proceed to adjuvant radiation and  then return to anastrozole.  P has a good understanding of this plan. She agrees with it. She knows a goal of treatment in her case is cure. She will call with any problems that may develop before her next visit here.[place]  ADDENDUM: I discussed the pathology with Dr. Tillman Sers and requested a prognostic panel from the smaller lesion (which was easily identifiable because it was not associated with the biopsy clip). We will do the HER-2 in particular by fish. This should be ready within a week.  Chauncey Cruel, MD   06/20/2015 8:52 AM Medical Oncology and Hematology [facility] [address] [place], [place] [address] Tel. [phone]    Fax. [phone]

## 2015-06-20 NOTE — Telephone Encounter (Signed)
Gave patient avs report and appointments for December  °

## 2015-06-21 ENCOUNTER — Encounter (HOSPITAL_BASED_OUTPATIENT_CLINIC_OR_DEPARTMENT_OTHER): Payer: Self-pay | Admitting: *Deleted

## 2015-06-22 ENCOUNTER — Other Ambulatory Visit: Payer: Self-pay | Admitting: Oncology

## 2015-06-23 ENCOUNTER — Other Ambulatory Visit: Payer: Self-pay | Admitting: *Deleted

## 2015-06-23 ENCOUNTER — Other Ambulatory Visit: Payer: Managed Care, Other (non HMO)

## 2015-06-23 DIAGNOSIS — C50411 Malignant neoplasm of upper-outer quadrant of right female breast: Secondary | ICD-10-CM

## 2015-06-26 ENCOUNTER — Ambulatory Visit (HOSPITAL_COMMUNITY): Payer: Managed Care, Other (non HMO)

## 2015-06-26 ENCOUNTER — Other Ambulatory Visit: Payer: Self-pay | Admitting: *Deleted

## 2015-06-26 ENCOUNTER — Ambulatory Visit (HOSPITAL_BASED_OUTPATIENT_CLINIC_OR_DEPARTMENT_OTHER): Payer: Managed Care, Other (non HMO)

## 2015-06-26 VITALS — BP 133/65 | HR 74 | Temp 99.1°F

## 2015-06-26 DIAGNOSIS — Z23 Encounter for immunization: Secondary | ICD-10-CM

## 2015-06-26 DIAGNOSIS — C50411 Malignant neoplasm of upper-outer quadrant of right female breast: Secondary | ICD-10-CM

## 2015-06-26 MED ORDER — INFLUENZA VAC SPLIT QUAD 0.5 ML IM SUSY
0.5000 mL | PREFILLED_SYRINGE | Freq: Once | INTRAMUSCULAR | Status: AC
  Administered 2015-06-26: 0.5 mL via INTRAMUSCULAR
  Filled 2015-06-26: qty 0.5

## 2015-06-27 ENCOUNTER — Ambulatory Visit (HOSPITAL_BASED_OUTPATIENT_CLINIC_OR_DEPARTMENT_OTHER): Payer: Managed Care, Other (non HMO) | Admitting: Anesthesiology

## 2015-06-27 ENCOUNTER — Ambulatory Visit (HOSPITAL_COMMUNITY): Payer: Managed Care, Other (non HMO)

## 2015-06-27 ENCOUNTER — Ambulatory Visit (HOSPITAL_BASED_OUTPATIENT_CLINIC_OR_DEPARTMENT_OTHER)
Admission: RE | Admit: 2015-06-27 | Discharge: 2015-06-27 | Disposition: A | Discharge status: Home / Self Care | Payer: Managed Care, Other (non HMO) | Source: Ambulatory Visit | Attending: General Surgery | Admitting: General Surgery

## 2015-06-27 ENCOUNTER — Encounter (HOSPITAL_BASED_OUTPATIENT_CLINIC_OR_DEPARTMENT_OTHER)
Admission: RE | Disposition: A | Discharge status: Home / Self Care | Payer: Self-pay | Source: Ambulatory Visit | Attending: General Surgery

## 2015-06-27 ENCOUNTER — Encounter (HOSPITAL_BASED_OUTPATIENT_CLINIC_OR_DEPARTMENT_OTHER): Payer: Self-pay | Admitting: *Deleted

## 2015-06-27 DIAGNOSIS — M1990 Unspecified osteoarthritis, unspecified site: Secondary | ICD-10-CM | POA: Diagnosis not present

## 2015-06-27 DIAGNOSIS — Z419 Encounter for procedure for purposes other than remedying health state, unspecified: Secondary | ICD-10-CM

## 2015-06-27 DIAGNOSIS — Z79899 Other long term (current) drug therapy: Secondary | ICD-10-CM | POA: Insufficient documentation

## 2015-06-27 DIAGNOSIS — C50911 Malignant neoplasm of unspecified site of right female breast: Secondary | ICD-10-CM | POA: Insufficient documentation

## 2015-06-27 DIAGNOSIS — I471 Supraventricular tachycardia: Secondary | ICD-10-CM | POA: Diagnosis not present

## 2015-06-27 HISTORY — PX: PORTACATH PLACEMENT: SHX2246

## 2015-06-27 SURGERY — INSERTION, TUNNELED CENTRAL VENOUS DEVICE, WITH PORT
Anesthesia: General | Site: Chest | Laterality: Right

## 2015-06-27 MED ORDER — HEPARIN (PORCINE) IN NACL 2-0.9 UNIT/ML-% IJ SOLN
INTRAMUSCULAR | Status: DC | PRN
  Administered 2015-06-27: 1 via INTRAVENOUS

## 2015-06-27 MED ORDER — PROPOFOL 10 MG/ML IV BOLUS
INTRAVENOUS | Status: DC | PRN
  Administered 2015-06-27: 150 mg via INTRAVENOUS

## 2015-06-27 MED ORDER — PROMETHAZINE HCL 25 MG/ML IJ SOLN: 6.2500 mg | INTRAMUSCULAR | Status: DC | PRN

## 2015-06-27 MED ORDER — LACTATED RINGERS IV SOLN
INTRAVENOUS | Status: DC | PRN
  Administered 2015-06-27 (×2): via INTRAVENOUS

## 2015-06-27 MED ORDER — ONDANSETRON HCL 4 MG/2ML IJ SOLN
INTRAMUSCULAR | Status: AC
  Filled 2015-06-27: qty 2

## 2015-06-27 MED ORDER — MIDAZOLAM HCL 2 MG/2ML IJ SOLN: 1.0000 mg | INTRAMUSCULAR | Status: DC | PRN

## 2015-06-27 MED ORDER — LIDOCAINE HCL (CARDIAC) 20 MG/ML IV SOLN
INTRAVENOUS | Status: AC
  Filled 2015-06-27: qty 5

## 2015-06-27 MED ORDER — CEFAZOLIN SODIUM-DEXTROSE 2-3 GM-% IV SOLR
INTRAVENOUS | Status: AC
  Filled 2015-06-27: qty 50

## 2015-06-27 MED ORDER — FENTANYL CITRATE (PF) 100 MCG/2ML IJ SOLN: 50.0000 ug | INTRAMUSCULAR | Status: DC | PRN

## 2015-06-27 MED ORDER — CEFAZOLIN SODIUM-DEXTROSE 2-3 GM-% IV SOLR: 2.0000 g | INTRAVENOUS | Status: DC

## 2015-06-27 MED ORDER — CHLORHEXIDINE GLUCONATE 4 % EX LIQD: 1.0000 "application " | Freq: Once | CUTANEOUS | Status: DC

## 2015-06-27 MED ORDER — HEPARIN SOD (PORK) LOCK FLUSH 100 UNIT/ML IV SOLN
INTRAVENOUS | Status: DC | PRN
  Administered 2015-06-27: 500 [IU] via INTRAVENOUS

## 2015-06-27 MED ORDER — MIDAZOLAM HCL 2 MG/2ML IJ SOLN
INTRAMUSCULAR | Status: AC
  Filled 2015-06-27: qty 2

## 2015-06-27 MED ORDER — BUPIVACAINE-EPINEPHRINE 0.25% -1:200000 IJ SOLN
INTRAMUSCULAR | Status: DC | PRN
  Administered 2015-06-27: 13 mL

## 2015-06-27 MED ORDER — PROPOFOL 10 MG/ML IV BOLUS
INTRAVENOUS | Status: AC
  Filled 2015-06-27: qty 20

## 2015-06-27 MED ORDER — MIDAZOLAM HCL 5 MG/5ML IJ SOLN
INTRAMUSCULAR | Status: DC | PRN
  Administered 2015-06-27: 2 mg via INTRAVENOUS

## 2015-06-27 MED ORDER — FENTANYL CITRATE (PF) 100 MCG/2ML IJ SOLN
INTRAMUSCULAR | Status: AC
  Filled 2015-06-27: qty 2

## 2015-06-27 MED ORDER — GLYCOPYRROLATE 0.2 MG/ML IJ SOLN: 0.2000 mg | Freq: Once | INTRAMUSCULAR | Status: DC | PRN

## 2015-06-27 MED ORDER — DEXAMETHASONE SODIUM PHOSPHATE 4 MG/ML IJ SOLN
INTRAMUSCULAR | Status: DC | PRN
  Administered 2015-06-27: 10 mg via INTRAVENOUS

## 2015-06-27 MED ORDER — ONDANSETRON HCL 4 MG/2ML IJ SOLN
INTRAMUSCULAR | Status: DC | PRN
  Administered 2015-06-27: 4 mg via INTRAVENOUS

## 2015-06-27 MED ORDER — DEXAMETHASONE SODIUM PHOSPHATE 10 MG/ML IJ SOLN
INTRAMUSCULAR | Status: AC
  Filled 2015-06-27: qty 1

## 2015-06-27 MED ORDER — LACTATED RINGERS IV SOLN
INTRAVENOUS | Status: DC
  Administered 2015-06-27: 14:00:00 via INTRAVENOUS

## 2015-06-27 MED ORDER — FENTANYL CITRATE (PF) 100 MCG/2ML IJ SOLN: 25.0000 ug | INTRAMUSCULAR | Status: DC | PRN

## 2015-06-27 MED ORDER — SCOPOLAMINE 1 MG/3DAYS TD PT72: 1.0000 | MEDICATED_PATCH | Freq: Once | TRANSDERMAL | Status: DC

## 2015-06-27 MED ORDER — LIDOCAINE HCL (CARDIAC) 20 MG/ML IV SOLN
INTRAVENOUS | Status: DC | PRN
  Administered 2015-06-27: 60 mg via INTRAVENOUS

## 2015-06-27 MED ORDER — FENTANYL CITRATE (PF) 100 MCG/2ML IJ SOLN
INTRAMUSCULAR | Status: DC | PRN
  Administered 2015-06-27: 100 ug via INTRAVENOUS

## 2015-06-27 SURGICAL SUPPLY — 45 items
BAG DECANTER FOR FLEXI CONT (MISCELLANEOUS) ×2 IMPLANT
BANDAGE ADH SHEER 1  50/CT (GAUZE/BANDAGES/DRESSINGS) ×2 IMPLANT
BLADE SURG 15 STRL LF DISP TIS (BLADE) ×1 IMPLANT
BLADE SURG 15 STRL SS (BLADE) ×2
CHLORAPREP W/TINT 26ML (MISCELLANEOUS) ×2 IMPLANT
COVER BACK TABLE 60X90IN (DRAPES) ×2 IMPLANT
COVER MAYO STAND STRL (DRAPES) ×2 IMPLANT
DECANTER SPIKE VIAL GLASS SM (MISCELLANEOUS) IMPLANT
DRAPE C-ARM 42X72 X-RAY (DRAPES) ×2 IMPLANT
DRAPE LAPAROTOMY T 102X78X121 (DRAPES) ×2 IMPLANT
DRAPE UTILITY XL STRL (DRAPES) ×2 IMPLANT
DRSG TEGADERM 4X4.75 (GAUZE/BANDAGES/DRESSINGS) ×2 IMPLANT
ELECT REM PT RETURN 9FT ADLT (ELECTROSURGICAL) ×2
ELECTRODE REM PT RTRN 9FT ADLT (ELECTROSURGICAL) ×1 IMPLANT
GLOVE BIO SURGEON STRL SZ 6.5 (GLOVE) ×1 IMPLANT
GLOVE BIOGEL PI IND STRL 7.0 (GLOVE) IMPLANT
GLOVE BIOGEL PI IND STRL 8 (GLOVE) ×1 IMPLANT
GLOVE BIOGEL PI INDICATOR 7.0 (GLOVE) ×1
GLOVE BIOGEL PI INDICATOR 8 (GLOVE) ×1
GLOVE ECLIPSE 6.5 STRL STRAW (GLOVE) ×1 IMPLANT
GLOVE ECLIPSE 7.5 STRL STRAW (GLOVE) ×2 IMPLANT
GOWN STRL REUS W/ TWL LRG LVL3 (GOWN DISPOSABLE) ×1 IMPLANT
GOWN STRL REUS W/ TWL XL LVL3 (GOWN DISPOSABLE) ×1 IMPLANT
GOWN STRL REUS W/TWL LRG LVL3 (GOWN DISPOSABLE) ×2
GOWN STRL REUS W/TWL XL LVL3 (GOWN DISPOSABLE) ×2
IV CATH PLACEMENT UNIT 16 GA (IV SOLUTION) IMPLANT
IV KIT MINILOC 20X1 SAFETY (NEEDLE) IMPLANT
KIT PORT POWER 8FR ISP CVUE (Catheter) ×2 IMPLANT
LIQUID BAND (GAUZE/BANDAGES/DRESSINGS) ×2 IMPLANT
NDL HYPO 25X1 1.5 SAFETY (NEEDLE) ×1 IMPLANT
NEEDLE HYPO 22GX1.5 SAFETY (NEEDLE) ×2 IMPLANT
NEEDLE HYPO 25X1 1.5 SAFETY (NEEDLE) ×2 IMPLANT
PACK BASIN DAY SURGERY FS (CUSTOM PROCEDURE TRAY) ×2 IMPLANT
PENCIL BUTTON HOLSTER BLD 10FT (ELECTRODE) ×2 IMPLANT
SET SHEATH INTRODUCER 10FR (MISCELLANEOUS) IMPLANT
SHEATH COOK PEEL AWAY SET 9F (SHEATH) IMPLANT
SLEEVE SCD COMPRESS KNEE MED (MISCELLANEOUS) ×1 IMPLANT
SPONGE GAUZE 4X4 12PLY STER LF (GAUZE/BANDAGES/DRESSINGS) ×1 IMPLANT
SUT MON AB 4-0 PC3 18 (SUTURE) ×2 IMPLANT
SUT PROLENE 2 0 CT2 30 (SUTURE) ×2 IMPLANT
SUT SILK 4 0 TIES 17X18 (SUTURE) IMPLANT
SYR 5ML LUER SLIP (SYRINGE) ×2 IMPLANT
SYR CONTROL 10ML LL (SYRINGE) ×2 IMPLANT
TOWEL OR 17X24 6PK STRL BLUE (TOWEL DISPOSABLE) ×4 IMPLANT
TOWEL OR NON WOVEN STRL DISP B (DISPOSABLE) ×2 IMPLANT

## 2015-06-27 NOTE — Discharge Instructions (Signed)

## 2015-06-27 NOTE — Anesthesia Preprocedure Evaluation (Signed)
Anesthesia Evaluation  Patient identified by MRN, date of birth, ID band Patient awake    Reviewed: Allergy & Precautions, NPO status , Patient's Chart, lab work & pertinent test results  Airway Mallampati: II  TM Distance: >3 FB Neck ROM: Full    Dental no notable dental hx.    Pulmonary neg pulmonary ROS,    Pulmonary exam normal breath sounds clear to auscultation       Cardiovascular negative cardio ROS Normal cardiovascular exam Rhythm:Regular Rate:Normal     Neuro/Psych negative neurological ROS  negative psych ROS   GI/Hepatic negative GI ROS, Neg liver ROS,   Endo/Other  negative endocrine ROS  Renal/GU negative Renal ROS  negative genitourinary   Musculoskeletal negative musculoskeletal ROS (+)   Abdominal   Peds negative pediatric ROS (+)  Hematology negative hematology ROS (+)   Anesthesia Other Findings   Reproductive/Obstetrics negative OB ROS                             Anesthesia Physical Anesthesia Plan  ASA: II  Anesthesia Plan: General   Post-op Pain Management:    Induction: Intravenous  Airway Management Planned: LMA  Additional Equipment:   Intra-op Plan:   Post-operative Plan: Extubation in OR  Informed Consent: I have reviewed the patients History and Physical, chart, labs and discussed the procedure including the risks, benefits and alternatives for the proposed anesthesia with the patient or authorized representative who has indicated his/her understanding and acceptance.   Dental advisory given  Plan Discussed with: CRNA and Surgeon  Anesthesia Plan Comments:         Anesthesia Quick Evaluation  

## 2015-06-27 NOTE — Anesthesia Procedure Notes (Signed)
Procedure Name: LMA Insertion Date/Time: 06/27/2015 2:38 PM Performed by: Lyndee Leo Pre-anesthesia Checklist: Patient identified, Emergency Drugs available, Suction available and Patient being monitored Patient Re-evaluated:Patient Re-evaluated prior to inductionOxygen Delivery Method: Circle System Utilized Preoxygenation: Pre-oxygenation with 100% oxygen Intubation Type: IV induction Ventilation: Mask ventilation without difficulty LMA: LMA inserted LMA Size: 4.0 Number of attempts: 1 Airway Equipment and Method: Bite block Placement Confirmation: positive ETCO2 Tube secured with: Tape Dental Injury: Teeth and Oropharynx as per pre-operative assessment

## 2015-06-27 NOTE — Op Note (Signed)
Preoperative diagnosis: Cancer of the breast and the poor venous access  Postoperative diagnosis: Same  Procedure: Placement of ClearVue subcutaneous venous port  Surgeon: Excell Seltzer M.D.  Anesthesia: LMA general   Description of procedure: Patient is brought to the operating room and placed in the supine position on the operating table. IV sedation was administered. The entire upper chest and neck were widely sterilely prepped and draped. Local anesthesia was used to infiltrate the insertion of port site. The left subclavian vein was cannulated with a needle and guidewire without difficulty and position in the superior vena cava was confirmed by fluoroscopy. The introducer was then placed over the guidewire and the flushed catheter placed via the introducer which was stripped away and the tip of the catheter positioned near the cavoatrial junction. A small transverse incision was made in the anterior chest wall and subcutaneous pocket created. The catheter was tunneled into the pocket, trimmed to length, and attached to the flushed port which was positioned in the pocket. The port was sutured to the chest wall with interrupted 2-0 Prolene. The incisions were closed with subcutaneous interrupted Monocryl and the skin incisions closed with subcuticular Monocryl and Dermabond. The port was accessed and flushed and aspirated easily and was left flushed with concentrated heparin solution. Sponge needle as the counts were correct. The patient was taken to recovery in good condition.  Brynlee Pennywell T  06/27/2015

## 2015-06-27 NOTE — Interval H&P Note (Signed)
History and Physical Interval Note:  06/27/2015 1:50 PM  Catherine Lawson  has presented today for surgery, with the diagnosis of Cancer of Breast  The various methods of treatment have been discussed with the patient and family. After consideration of risks, benefits and other options for treatment, the patient has consented to  Procedure(s): INSERTION PORT-A-CATH (N/A) as a surgical intervention .  The patient's history has been reviewed, patient examined, no change in status, stable for surgery.  I have reviewed the patient's chart and labs.  Questions were answered to the patient's satisfaction.     Rida Loudin T

## 2015-06-27 NOTE — Transfer of Care (Signed)
Immediate Anesthesia Transfer of Care Note  Patient: Catherine Lawson  Procedure(s) Performed: Procedure(s): INSERTION PORT-A-CATH (Right)  Patient Location: PACU  Anesthesia Type:General  Level of Consciousness: awake, oriented and patient cooperative  Airway & Oxygen Therapy: Patient Spontanous Breathing and Patient connected to face mask oxygen  Post-op Assessment: Report given to RN and Post -op Vital signs reviewed and stable  Post vital signs: Reviewed and stable  Last Vitals:  Filed Vitals:   06/27/15 1345  BP: 122/56  Temp: 36.8 C  Resp: 18    Complications: No apparent anesthesia complications

## 2015-06-27 NOTE — H&P (Signed)
  History of Present Illness Marland Kitchen T. Daija Routson MD; 06/23/2015 2:35 PM) Patient words: post op NL Lump.  The patient is a 58 year old female who presents with breast cancer. She returns following reexcision of her multifocal invasive ductal carcinoma right breast. She had just one of the 2 masses seen on preoperative workup biopsy due to bleeding at the time of the initial biopsy. This revealed ER/PR positive invasive ductal carcinoma. She had shrinkage of the larger mass only during neoadjuvant hormonal therapy. Final pathology revealed a 0.9 cm mass and a 0.7 cm mass grade 2. 5 sentinel lymph nodes were negative. 3 margins were focally positive for invasive cancer. All 3 margins were reexcised with no residual cancer seen. Oncotype score was somewhat high at 35 and chemotherapy is planned. She will need a Port-A-Cath. She reports no problems from her surgery.    Allergies Ivor Costa, [place]; 06/23/2015 2:16 PM) No Known Drug Allergies11/01/2015  Medication History Ivor Costa, [place]; 06/23/2015 2:17 PM) Hydrocodone-Acetaminophen (10-325MG  Tablet, Oral) Active. LORazepam (0.5MG  Tablet, Oral) Active. Dexamethasone (4MG  Tablet, Oral) Active. Lidocaine-Prilocaine (2.5-2.5% Cream, External) Active. Prochlorperazine Maleate (10MG  Tablet, Oral) Active. Ondansetron HCl (8MG  Tablet, Oral) Active. Tobramycin-Dexamethasone (0.3-0.1% Suspension, Ophthalmic) Active. Hydrocodone-Acetaminophen (5-325MG  Tablet, Oral) Active. Medications Reconciled  Vitals Ivor Costa CMA; 06/23/2015 2:16 PM) 06/23/2015 2:15 PM Weight: 146.8 lb Temp.: 65F(Temporal)  Pulse: 70 (Regular)  Resp.: 16 (Unlabored)  BP: 136/84 (Sitting, Left Arm, Standard) General: Alert Caucasian female in no distress Skin: No rash or infection Lungs: No wheezing or increased work of breathing Breasts: Healing incision right breast without complicating factors. Mild swelling. Cardiac: Regular rate and  rhythm without murmurs.      Assessment & Plan Marland Kitchen T. Camdon Saetern MD; 06/23/2015 2:39 PM) CANCER OF RIGHT BREAST, STAGE 1 (C50.911) Impression: Doing well following reexcision of her multi-focal stage I right breast cancer. Has a relatively high Oncotype score and chemotherapy planned. She will need a Port-A-Cath. We discussed that procedure including its nature and risks of bleeding, infection, anesthetic risk, pneumothorax, and long-term risks of DVT or catheter occlusion or displacement. All her questions were answered. Current Plans Follow up with [place] in the office in 6 MONTHS.  Call us sooner as needed.  Schedule for Surgery Port-A-Cath placement

## 2015-06-27 NOTE — Anesthesia Postprocedure Evaluation (Signed)
Anesthesia Post Note  Patient: Catherine Lawson  Procedure(s) Performed: Procedure(s) (LRB): INSERTION PORT-A-CATH (Right)  Patient location during evaluation: PACU Anesthesia Type: General Level of consciousness: awake and alert Pain management: pain level controlled Vital Signs Assessment: post-procedure vital signs reviewed and stable Respiratory status: spontaneous breathing and respiratory function stable Cardiovascular status: blood pressure returned to baseline and stable Postop Assessment: no signs of nausea or vomiting Anesthetic complications: no    Last Vitals:  Filed Vitals:   06/27/15 1537 06/27/15 1545  BP:  124/83  Pulse: 75 71  Temp: 36.9 C   Resp: 13 12    Last Pain:  Filed Vitals:   06/27/15 1549  PainSc: 0-No pain                 Herberto Ledwell S

## 2015-06-28 ENCOUNTER — Other Ambulatory Visit: Payer: Self-pay | Admitting: Nurse Practitioner

## 2015-06-28 ENCOUNTER — Encounter: Payer: Self-pay | Admitting: Oncology

## 2015-06-28 ENCOUNTER — Ambulatory Visit (HOSPITAL_BASED_OUTPATIENT_CLINIC_OR_DEPARTMENT_OTHER): Payer: Managed Care, Other (non HMO) | Admitting: Nurse Practitioner

## 2015-06-28 ENCOUNTER — Telehealth: Payer: Self-pay | Admitting: Oncology

## 2015-06-28 ENCOUNTER — Ambulatory Visit (HOSPITAL_BASED_OUTPATIENT_CLINIC_OR_DEPARTMENT_OTHER): Payer: Managed Care, Other (non HMO)

## 2015-06-28 ENCOUNTER — Other Ambulatory Visit (HOSPITAL_BASED_OUTPATIENT_CLINIC_OR_DEPARTMENT_OTHER): Payer: Managed Care, Other (non HMO)

## 2015-06-28 ENCOUNTER — Encounter (HOSPITAL_BASED_OUTPATIENT_CLINIC_OR_DEPARTMENT_OTHER): Payer: Self-pay | Admitting: General Surgery

## 2015-06-28 VITALS — BP 134/73 | HR 79 | Temp 98.2°F | Resp 18 | Wt 148.7 lb

## 2015-06-28 VITALS — BP 128/64 | HR 62 | Temp 97.9°F | Resp 18

## 2015-06-28 DIAGNOSIS — C50411 Malignant neoplasm of upper-outer quadrant of right female breast: Secondary | ICD-10-CM

## 2015-06-28 DIAGNOSIS — Z5189 Encounter for other specified aftercare: Secondary | ICD-10-CM

## 2015-06-28 DIAGNOSIS — Z170 Estrogen receptor positive status [ER+]: Secondary | ICD-10-CM | POA: Diagnosis not present

## 2015-06-28 DIAGNOSIS — Z5111 Encounter for antineoplastic chemotherapy: Secondary | ICD-10-CM

## 2015-06-28 LAB — COMPREHENSIVE METABOLIC PANEL
ALT: 14 U/L (ref 0–55)
ANION GAP: 12 meq/L — AB (ref 3–11)
AST: 15 U/L (ref 5–34)
Albumin: 4 g/dL (ref 3.5–5.0)
Alkaline Phosphatase: 120 U/L (ref 40–150)
BILIRUBIN TOTAL: 0.54 mg/dL (ref 0.20–1.20)
BUN: 13.3 mg/dL (ref 7.0–26.0)
CALCIUM: 9.5 mg/dL (ref 8.4–10.4)
CO2: 22 meq/L (ref 22–29)
Chloride: 107 mEq/L (ref 98–109)
Creatinine: 0.9 mg/dL (ref 0.6–1.1)
EGFR: 67 mL/min/{1.73_m2} — AB (ref >90)
Glucose: 164 mg/dl — ABNORMAL HIGH (ref 70–140)
Potassium: 4 mEq/L (ref 3.5–5.1)
Sodium: 141 mEq/L (ref 136–145)
TOTAL PROTEIN: 7.2 g/dL (ref 6.4–8.3)

## 2015-06-28 LAB — CBC WITH DIFFERENTIAL/PLATELET
BASO%: 0.1 % (ref 0.0–2.0)
Basophils Absolute: 0 10*3/uL (ref 0.0–0.1)
EOS ABS: 0 10*3/uL (ref 0.0–0.5)
EOS%: 0 % (ref 0.0–7.0)
HEMATOCRIT: 44.3 % (ref 34.8–46.6)
HGB: 14.8 g/dL (ref 11.6–15.9)
LYMPH%: 11.3 % — AB (ref 14.0–49.7)
MCH: 30 pg (ref 25.1–34.0)
MCHC: 33.4 g/dL (ref 31.5–36.0)
MCV: 89.9 fL (ref 79.5–101.0)
MONO#: 0.3 10*3/uL (ref 0.1–0.9)
MONO%: 2.6 % (ref 0.0–14.0)
NEUT%: 86 % — AB (ref 38.4–76.8)
NEUTROS ABS: 9.2 10*3/uL — AB (ref 1.5–6.5)
PLATELETS: 270 10*3/uL (ref 145–400)
RBC: 4.93 10*6/uL (ref 3.70–5.45)
RDW: 13.4 % (ref 11.2–14.5)
WBC: 10.8 10*3/uL — ABNORMAL HIGH (ref 3.9–10.3)
lymph#: 1.2 10*3/uL (ref 0.9–3.3)

## 2015-06-28 MED ORDER — SODIUM CHLORIDE 0.9 % IJ SOLN
10.0000 mL | INTRAMUSCULAR | Status: DC | PRN
  Administered 2015-06-28: 10 mL
  Filled 2015-06-28: qty 10

## 2015-06-28 MED ORDER — HEPARIN SOD (PORK) LOCK FLUSH 100 UNIT/ML IV SOLN
500.0000 [IU] | Freq: Once | INTRAVENOUS | Status: AC | PRN
  Administered 2015-06-28: 500 [IU]
  Filled 2015-06-28: qty 5

## 2015-06-28 MED ORDER — DEXAMETHASONE SODIUM PHOSPHATE 100 MG/10ML IJ SOLN
Freq: Once | INTRAMUSCULAR | Status: AC
  Administered 2015-06-28: 11:00:00 via INTRAVENOUS
  Filled 2015-06-28: qty 8

## 2015-06-28 MED ORDER — DEXTROSE 5 % IV SOLN
75.0000 mg/m2 | Freq: Once | INTRAVENOUS | Status: AC
  Administered 2015-06-28: 130 mg via INTRAVENOUS
  Filled 2015-06-28: qty 13

## 2015-06-28 MED ORDER — SODIUM CHLORIDE 0.9 % IV SOLN
Freq: Once | INTRAVENOUS | Status: AC
  Administered 2015-06-28: 11:00:00 via INTRAVENOUS

## 2015-06-28 MED ORDER — SODIUM CHLORIDE 0.9 % IV SOLN
600.0000 mg/m2 | Freq: Once | INTRAVENOUS | Status: AC
  Administered 2015-06-28: 1040 mg via INTRAVENOUS
  Filled 2015-06-28: qty 52

## 2015-06-28 MED ORDER — PEGFILGRASTIM 6 MG/0.6ML ~~LOC~~ PSKT
6.0000 mg | PREFILLED_SYRINGE | Freq: Once | SUBCUTANEOUS | Status: AC
  Administered 2015-06-28: 6 mg via SUBCUTANEOUS
  Filled 2015-06-28: qty 0.6

## 2015-06-28 NOTE — Progress Notes (Signed)
Patient previously advised me that they would be over income for financial assistance.Met with patient in treatment to give ACS referral to complete as well as [employer] First Step for Neulasta(no income requirements). Patient approved for Neulasta $10,000. Faxed release form to [place] _[phone]. Fax received ok per confirmation. Pt has my card for any additional questions or concerns.

## 2015-06-28 NOTE — Telephone Encounter (Signed)
Gave patient avs report and appointments for December and January  °

## 2015-06-28 NOTE — Progress Notes (Signed)
[place]  Telephone:([phone] Fax:([phone]) [phone]     ID: EULIA HATCHER DOB: 1956/05/21  MR#: [id number]  YQI#:[id number]  Patient Care Team: Cari Caraway, MD as PCP - General (Family Medicine) Excell Seltzer, MD as Consulting Physician (General Surgery) Chauncey Cruel, MD as Consulting Physician (Oncology) Kyung Rudd, MD as Consulting Physician (Radiation Oncology) Mauro Kaufmann, RN as Registered Nurse Rockwell Germany, RN as Registered Nurse Sylvan Cheese, NP as Nurse Practitioner (Nurse Practitioner) Vania Rea, MD as Consulting Physician (Obstetrics and Gynecology) PCP: Cari Caraway, MD OTHER MD:  CHIEF COMPLAINT: Estrogen receptor positive breast cancer  CURRENT TREATMENT: Adjuvant chemotherapy  BREAST CANCER HISTORY: From the original intake note:     "[name]" had routine screening mammography at [facility] 01/24/2015. This showed a 1.6 cm mass in the right breast at the 11:00 position, and on 0.8 cm irregular density also at the 11:00 position farther away from the nipple. On 02/01/2015 she underwent a unilateral right diagnostic mammography at [facility]. The breast density was category B. In addition to the 2 masses previously noted there was a cluster of calcifications at the 9:00 position. Ultrasonography the same day showed a 1.9 cm oval mass, which was hypoechoic with no vascularity and hard on L a Stogner if he, a 7 mm mass at the 10:00 position with no vascularity and intermediate L a Stogner 3, and a lymph node with uniform cortex thickening in the right axilla. The 2 masses in question were separated by 2 cm.  On 02/02/2015 the patient underwent biopsy of what appears to have been the larger of the 2 breast masses (I cannot locate the biopsy report). This documented invasive ductal carcinoma, grade 2 or 3, with micropapillary features, estrogen and progesterone receptor positive, with an MIB-1 of 40%, and HER-2 not amplified with a signals  ratio of 1.36 and a number per cell of 3.60. There was evidence of lymphovascular invasion.  Because of bleeding from the first biopsy, the second breast mass could not be performed. The lymph node was aspirated, not cord, and this appeared benign but the concordance is questionable.  The patient's subsequent history is as detailed below  INTERVAL HISTORY: [name] returns today for follow-up of her breast cancer, accompanied by her husband Event organiser. Today is day 1, cycle 1 of cyclophosphamide and docetaxel given every 21 days, with neulasta onpro given on day 2 for granulocyte support.   REVIEW OF SYSTEMS: [name] is doing well today. She is somewhat anxious about starting chemotherapy, but otherwise has no complaints offer. Her port was placed yesterday and it remains accessed for treatment today. The procedure went well with no complications. Her pain is minimal, just tenderness. A detailed review of systems is otherwise stable.  PAST MEDICAL HISTORY: Past Medical History  Diagnosis Date  . Breast cancer of upper-outer quadrant of right female breast ([place]) 02/06/2015  . Breast cancer ([place])   . Family history of breast cancer   . Family history of colon cancer   . Family history of ovarian cancer   . Family history of stomach cancer   . Arthritis     bil thumb joints  . Dysrhythmia     fast HR at times due to stress, no chest pain or SOB    PAST SURGICAL HISTORY: Past Surgical History  Procedure Laterality Date  . Wisdom tooth extraction    . Radioactive seed guided mastectomy with axillary sentinel lymph node biopsy Right 05/29/2015    Procedure: RADIOACTIVE SEED  RIGHT BREAST LUMPECTOMY WITH AXILLARY SENTINEL LYMPH NODE BIOPSY;  Surgeon: Excell Seltzer, MD;  Location: [facility];  Service: General;  Laterality: Right;  . Re-excision of breast lumpectomy Right 06/12/2015    Procedure: RE-EXCISION OF RIGHT  BREAST LUMPECTOMY;  Surgeon: Excell Seltzer, MD;  Location: [facility];  Service: General;  Laterality: Right;    FAMILY HISTORY Family History  Problem Relation Age of Onset  . Breast cancer Sister 18  . Colon cancer Father 64  . Cancer Maternal Grandmother     probable ovarian cancer  . Stomach cancer Paternal Grandmother 43  . Brain cancer Other 10    Medulloblastoma  . Parkinson's disease Mother   . Lung cancer Paternal Uncle     smoker  . Stomach cancer Other     PGMs sister  . Stomach cancer Other     PMGs brother   the patient's father is still living, at age 70. The patient's mother died from Parkinson's disease complications at age 49. The patient had 5 brothers, 4 sisters. One sister was diagnosed with breast cancer at age 24 and died at age 83. The patient's father was diagnosed with colon cancer at the age of 3. A nephew was diagnosed with medulloblastoma at age 49 and died at age 34. The paternal grandmother was diagnosed with stomach cancer at age 70. The maternal grandmother was diagnosed with metastatic cancer of unknown type at age 40. (The description is suggestive of an ovarian cancer).  GYNECOLOGIC HISTORY:  No LMP recorded. Patient is postmenopausal. Menarche age 50. The patient is GX P0. She stopped having periods in 2014. She did not take hormone replacement. She took oral contraceptives for approximately 20 years with no complications.  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 Her husband [name] works for a Merchandiser, retail in [employer] credited Warehouse manager". [name] has 2 daughters from an earlier marriage, Ronalee Belts lives in [place] and works in recruiting, and Lisia Westbay lives in [place] and works in Press photographer. They have 1 grandchild. The patient attends the [place]: [employer]   HEALTH MAINTENANCE: Social History  Substance Use Topics  . Smoking status: Never Smoker   . Smokeless tobacco: Not on file  . Alcohol Use:  Yes     Comment: social     Colonoscopy: 2014/lobe are  PAP: 2015  Bone density: Remote  Lipid panel:  No Known Allergies  Current Outpatient Prescriptions  Medication Sig Dispense Refill  . dexamethasone (DECADRON) 4 MG tablet Take 2 tablets (8 mg total) by mouth 2 (two) times daily. Start the day before Taxotere. Then again the day after chemo for 3 days. 30 tablet 1  . halobetasol (ULTRAVATE) 0.05 % ointment Apply 1 application topically daily as needed (eczema).     . lidocaine-prilocaine (EMLA) cream Apply to affected area once 30 g 3  . LORazepam (ATIVAN) 0.5 MG tablet Take 1 tablet (0.5 mg total) by mouth at bedtime as needed (Nausea or vomiting). 30 tablet 0  . ondansetron (ZOFRAN) 8 MG tablet Take 1 tablet (8 mg total) by mouth 2 (two) times daily. Start the day after chemo for 3 days. Then take as needed for nausea or vomiting. 30 tablet 1  . prochlorperazine (COMPAZINE) 10 MG tablet Take 1 tablet (10 mg total) by mouth every 6 (six) hours as needed (Nausea or vomiting). 30 tablet 1  .  anastrozole (ARIMIDEX) 1 MG tablet Take 1 tablet (1 mg total) by mouth daily. (Patient not taking: Reported on 06/28/2015) 90 tablet 4  . cetirizine (ZYRTEC) 10 MG tablet Take 10 mg by mouth daily. Reported on 06/28/2015    . tobramycin-dexamethasone (TOBRADEX) ophthalmic solution Place 1 drop into both eyes 2 (two) times daily. (Patient not taking: Reported on 06/28/2015) 5 mL 0   No current facility-administered medications for this visit.    OBJECTIVE: Middle-aged white woman who appears well Filed Vitals:   06/28/15 0926  BP: 134/73  Pulse: 79  Temp: 98.2 F (36.8 C)  Resp: 18     Body mass index is 25.51 kg/(m^2).    ECOG FS:0 - Asymptomatic  Skin: warm, dry  HEENT: sclerae anicteric, conjunctivae pink, oropharynx clear. No thrush or mucositis.  Lymph Nodes: No cervical or supraclavicular lymphadenopathy  Lungs: clear to auscultation bilaterally, no rales, wheezes, or rhonci    Heart: regular rate and rhythm  Abdomen: round, soft, non tender, positive bowel sounds  Musculoskeletal: No focal spinal tenderness, no peripheral edema  Neuro: non focal, well oriented, positive affect  Breasts: deferred   LAB RESULTS:  CMP     Component Value Date/Time   NA 141 06/28/2015 0911   K 4.0 06/28/2015 0911   CO2 22 06/28/2015 0911   GLUCOSE 164* 06/28/2015 0911   BUN 13.3 06/28/2015 0911   CREATININE 0.9 06/28/2015 0911   CALCIUM 9.5 06/28/2015 0911   PROT 7.2 06/28/2015 0911   ALBUMIN 4.0 06/28/2015 0911   AST 15 06/28/2015 0911   ALT 14 06/28/2015 0911   ALKPHOS 120 06/28/2015 0911   BILITOT 0.54 06/28/2015 0911    INo results found for: SPEP, UPEP  Lab Results  Component Value Date   WBC 10.8* 06/28/2015   NEUTROABS 9.2* 06/28/2015   HGB 14.8 06/28/2015   HCT 44.3 06/28/2015   MCV 89.9 06/28/2015   PLT 270 06/28/2015      Chemistry      Component Value Date/Time   NA 141 06/28/2015 0911   K 4.0 06/28/2015 0911   CO2 22 06/28/2015 0911   BUN 13.3 06/28/2015 0911   CREATININE 0.9 06/28/2015 0911      Component Value Date/Time   CALCIUM 9.5 06/28/2015 0911   ALKPHOS 120 06/28/2015 0911   AST 15 06/28/2015 0911   ALT 14 06/28/2015 0911   BILITOT 0.54 06/28/2015 0911       No results found for: LABCA2  No components found for: LABCA125  No results for input(s): INR in the last 168 hours.  Urinalysis No results found for: COLORURINE, APPEARANCEUR, LABSPEC, PHURINE, GLUCOSEU, HGBUR, BILIRUBINUR, KETONESUR, PROTEINUR, UROBILINOGEN, NITRITE, LEUKOCYTESUR  STUDIES: Dg Chest Portable 1 View  06/27/2015  CLINICAL DATA:  Status post port placement EXAM: PORTABLE CHEST - 1 VIEW COMPARISON:  None. FINDINGS: Left chest wall port is seen. Catheter tip is noted in the mid superior vena cava. No pneumothorax is seen. The cardiac shadow is stable. Postsurgical changes are noted in the right breast. IMPRESSION: No pneumothorax following port  placement. Electronically Signed   By: Inez Catalina M.D.   On: 06/27/2015 15:55   Dg Fluoro Guide Cv Line-no Report  06/27/2015  CLINICAL DATA:  FLOURO GUIDE CV LINE Fluoroscopy was utilized by the requesting physician.  No radiographic interpretation.    ASSESSMENT: 59 y.o. [place] woman status post right breast biopsy 02/02/2015 for a clinically multifocal T1c NX, stage IA invasive ductal carcinoma, grade 2 or 3, estrogen  and progesterone receptor positive, with HER-2 not amplified and the MIB-1 at 40%  (1) biopsy of a suspicious left breast mass 03/28/2015 showed only usual ductal hyperplasia, no malignancy identified.  (2) genetics testing 03/06/2015 through the Breast/Ovarian gene panel offered by [facility] found no deleterious mutations in ATM, BARD1, BRCA1, BRCA2, BRIP1, CDH1, CHEK2, EPCAM, FANCC, MLH1, MSH2, MSH6, NBN, PALB2, PMS2, PTEN, RAD51C, RAD51D, TP53, and XRCC2.  (a) there was a Variant of Unknown Significance in the BRCA2 gene called [id number]  (3) status post right lumpectomy and sentinel lymph node sampling 05/29/2015 for an mpT1b pN0, stage IA invasive ductal carcinoma, grade 2, with positive margins  (a) margins cleared with subsequent surgery 06/12/2015  (3) Oncotype score of 35 predicts a risk of outside the breast recurrence within 10 years of 24% if the patient's only systemic therapy is tamoxifen for 5 years. It also predicts significant benefit from chemotherapy.  (4) adjuvant chemotherapy to consist of cyclophosphamide and docetaxel 4, given 3 weeks apart, starting 06/27/2015.  (5) FISH panel returned HER-2 positive. Will begin trastuzumab every 3 weeks through 1 year, starting 07/18/14.   (6) adjuvant radiation to follow chemotherapy  (7) received anastrozole  02/08/2015 through December 2016, to resume at the completion of radiation  PLAN: [name] feels prepared to begin adjuvant chemotherapy today. We briefly reviewed the antiemetic schedule and she feels  comfortable with all of the drugs listed. The labs were reviewed in detail and were appropriate for treatment. She will proceed with cycle 1 of docetaxel and cyclophosphamide as planned today. I have asked her to stop the anastrozole if she has not already.  The prognostic panel from a smaller lesion sent last week returned HER-2 positive via [place] analysis. She will begin trastuzumab every 3 weeks to complete 1 year of anti-HER2 therapy, starting with the 2nd cycle of docetaxel and cyclophosphamide for ease of dosing schedule. Dr. Jana Hakim discussed this information with the patient personally. She will have an echocardiogram later this month.   [name] will return in 1 week for labs and a nadir visit. She understands and agrees with this plan. She knows the goal of treatment in her case is cure. She has been encouraged to call with any issues that might arise before her next visit here.   Laurie Panda, NP   06/28/2015 10:49 AM

## 2015-06-28 NOTE — Patient Instructions (Signed)
[facility] Discharge Instructions for Patients Receiving Chemotherapy  Today you received the following chemotherapy agents :  Taxotere, Cytoxan.  To help prevent nausea and vomiting after your treatment, we encourage you to take your nausea medication as prescribed.  Take Zofran 8 mg by mouth Twice daily for 3 days - start day after chemo - for nausea/vomiting, and as needed for nausea/vomiting thereafter.  Can also take Compazine 10mg  by mouth every 6 hours as needed for nausea/vomiting.  DO NOT Drive after taking Compazine as it can cause drowsiness. DO NOT Eat  Greasy  Nor   Spicy  Foods.   DO Drink  Lots  Of  Fluids  As tolerated.    If you develop nausea and vomiting that is not controlled by your nausea medication, call the clinic.   BELOW ARE SYMPTOMS THAT SHOULD BE REPORTED IMMEDIATELY:  *FEVER GREATER THAN 100.5 F  *CHILLS WITH OR WITHOUT FEVER  NAUSEA AND VOMITING THAT IS NOT CONTROLLED WITH YOUR NAUSEA MEDICATION  *UNUSUAL SHORTNESS OF BREATH  *UNUSUAL BRUISING OR BLEEDING  TENDERNESS IN MOUTH AND THROAT WITH OR WITHOUT PRESENCE OF ULCERS  *URINARY PROBLEMS  *BOWEL PROBLEMS  UNUSUAL RASH Items with * indicate a potential emergency and should be followed up as soon as possible.  Feel free to call the clinic you have any questions or concerns. The clinic phone number is [phone].  Please show the [facility] at check-in to the Emergency Department and triage nurse.

## 2015-06-29 ENCOUNTER — Encounter: Payer: Self-pay | Admitting: *Deleted

## 2015-06-29 ENCOUNTER — Ambulatory Visit: Payer: Managed Care, Other (non HMO)

## 2015-06-30 ENCOUNTER — Telehealth (HOSPITAL_COMMUNITY): Payer: Self-pay | Admitting: Vascular Surgery

## 2015-06-30 NOTE — Telephone Encounter (Signed)
Left message to make new pt appt 

## 2015-07-05 ENCOUNTER — Ambulatory Visit: Payer: Managed Care, Other (non HMO)

## 2015-07-05 ENCOUNTER — Ambulatory Visit (HOSPITAL_BASED_OUTPATIENT_CLINIC_OR_DEPARTMENT_OTHER): Payer: Managed Care, Other (non HMO) | Admitting: Nurse Practitioner

## 2015-07-05 ENCOUNTER — Encounter: Payer: Self-pay | Admitting: *Deleted

## 2015-07-05 ENCOUNTER — Encounter: Payer: Self-pay | Admitting: Nurse Practitioner

## 2015-07-05 ENCOUNTER — Other Ambulatory Visit (HOSPITAL_BASED_OUTPATIENT_CLINIC_OR_DEPARTMENT_OTHER): Payer: Managed Care, Other (non HMO)

## 2015-07-05 VITALS — BP 106/67 | HR 90 | Temp 99.1°F | Resp 18 | Ht 64.0 in | Wt 144.1 lb

## 2015-07-05 DIAGNOSIS — R002 Palpitations: Secondary | ICD-10-CM | POA: Diagnosis not present

## 2015-07-05 DIAGNOSIS — G4440 Drug-induced headache, not elsewhere classified, not intractable: Secondary | ICD-10-CM

## 2015-07-05 DIAGNOSIS — C50912 Malignant neoplasm of unspecified site of left female breast: Secondary | ICD-10-CM

## 2015-07-05 DIAGNOSIS — E875 Hyperkalemia: Secondary | ICD-10-CM

## 2015-07-05 DIAGNOSIS — C50411 Malignant neoplasm of upper-outer quadrant of right female breast: Secondary | ICD-10-CM

## 2015-07-05 LAB — CBC WITH DIFFERENTIAL/PLATELET
BASO%: 0.7 % (ref 0.0–2.0)
Basophils Absolute: 0.2 10*3/uL — ABNORMAL HIGH (ref 0.0–0.1)
EOS%: 0.4 % (ref 0.0–7.0)
Eosinophils Absolute: 0.1 10*3/uL (ref 0.0–0.5)
HEMATOCRIT: 44.5 % (ref 34.8–46.6)
HEMOGLOBIN: 14.6 g/dL (ref 11.6–15.9)
LYMPH#: 4 10*3/uL — AB (ref 0.9–3.3)
LYMPH%: 15.6 % (ref 14.0–49.7)
MCH: 29.3 pg (ref 25.1–34.0)
MCHC: 32.7 g/dL (ref 31.5–36.0)
MCV: 89.5 fL (ref 79.5–101.0)
MONO#: 1.7 10*3/uL — AB (ref 0.1–0.9)
MONO%: 6.8 % (ref 0.0–14.0)
NEUT#: 19.6 10*3/uL — ABNORMAL HIGH (ref 1.5–6.5)
NEUT%: 76.5 % (ref 38.4–76.8)
PLATELETS: 229 10*3/uL (ref 145–400)
RBC: 4.97 10*6/uL (ref 3.70–5.45)
RDW: 12.9 % (ref 11.2–14.5)
WBC: 25.6 10*3/uL — ABNORMAL HIGH (ref 3.9–10.3)

## 2015-07-05 LAB — COMPREHENSIVE METABOLIC PANEL
ALBUMIN: 3.8 g/dL (ref 3.5–5.0)
ALK PHOS: 133 U/L (ref 40–150)
ALT: 21 U/L (ref 0–55)
ANION GAP: 10 meq/L (ref 3–11)
AST: 26 U/L (ref 5–34)
BUN: 14.8 mg/dL (ref 7.0–26.0)
CALCIUM: 9.5 mg/dL (ref 8.4–10.4)
CO2: 24 mEq/L (ref 22–29)
CREATININE: 1 mg/dL (ref 0.6–1.1)
Chloride: 107 mEq/L (ref 98–109)
EGFR: 66 mL/min/{1.73_m2} — ABNORMAL LOW (ref >90)
Glucose: 76 mg/dl (ref 70–140)
POTASSIUM: 6.2 meq/L — AB (ref 3.5–5.1)
Sodium: 140 mEq/L (ref 136–145)
TOTAL PROTEIN: 6.9 g/dL (ref 6.4–8.3)
Total Bilirubin: 0.66 mg/dL (ref 0.20–1.20)

## 2015-07-05 LAB — POTASSIUM (CC13): POTASSIUM: 5.4 meq/L — AB (ref 3.5–5.1)

## 2015-07-05 NOTE — Progress Notes (Signed)
[place]  [phone]) [phone] [phone]) [phone]     ID: Catherine Lawson DOB: 07-17-1955  MR#: [id number]  KDT#:[id number]  Patient Care Team: Cari Caraway, MD as PCP - General (Family Medicine) Excell Seltzer, MD as Consulting Physician (General Surgery) Chauncey Cruel, MD as Consulting Physician (Oncology) Kyung Rudd, MD as Consulting Physician (Radiation Oncology) Mauro Kaufmann, RN as Registered Nurse Rockwell Germany, RN as Registered Nurse Sylvan Cheese, NP as Nurse Practitioner (Nurse Practitioner) Vania Rea, MD as Consulting Physician (Obstetrics and Gynecology) PCP: Cari Caraway, MD OTHER MD:  CHIEF COMPLAINT: Estrogen receptor positive breast cancer  CURRENT TREATMENT: Adjuvant chemotherapy  BREAST CANCER HISTORY: From the original intake note:     "Catherine Lawson" had routine screening mammography at [facility] 01/24/2015. This showed a 1.6 cm mass in the right breast at the 11:00 position, and on 0.8 cm irregular density also at the 11:00 position farther away from the nipple. On 02/01/2015 she underwent a unilateral right diagnostic mammography at [facility]. The breast density was category B. In addition to the 2 masses previously noted there was a cluster of calcifications at the 9:00 position. Ultrasonography the same day showed a 1.9 cm oval mass, which was hypoechoic with no vascularity and hard on L a Stogner if he, a 7 mm mass at the 10:00 position with no vascularity and intermediate L a Stogner 3, and a lymph node with uniform cortex thickening in the right axilla. The 2 masses in question were separated by 2 cm.  On 02/02/2015 the patient underwent biopsy of what appears to have been the larger of the 2 breast masses (I cannot locate the biopsy report). This documented invasive ductal carcinoma, grade 2 or 3, with micropapillary features, estrogen and progesterone receptor positive, with an MIB-1 of 40%, and HER-2 not amplified with a signals  ratio of 1.36 and a number per cell of 3.60. There was evidence of lymphovascular invasion.  Because of bleeding from the first biopsy, the second breast mass could not be performed. The lymph node was aspirated, not cord, and this appeared benign but the concordance is questionable.  The patient's subsequent history is as detailed below  INTERVAL HISTORY: Catherine Lawson returns today for follow-up of her breast cancer, alone. Today is day 8, cycle 1 of cyclophosphamide and docetaxel given every 21 days, with neulasta onpro given on day 2 for granulocyte support.   REVIEW OF SYSTEMS: Catherine Lawson denies fever or chills. She had minimal nausea with zofran and dexamethasone. She did not use much compazine. Unfortunately she did have palpitations throughout the weekend. Sometimes when her heart was "pounding" she felt a moderate amount of pressure to her head as well. Her appetite was down because she didn't feel well. She had bone pain despite claritin use. She was only taking 1 tablet of ibuprofen TID, and was afraid to take any more than this. She had constipation that was resolved with milk of magnesia. She denies mouth sores or rashes. She had tingling to her feet yesterday evening, only. A detailed review of systems is otherwise stable.    PAST MEDICAL HISTORY: Past Medical History  Diagnosis Date  . Breast cancer of upper-outer quadrant of right female breast ([place]) 02/06/2015  . Breast cancer ([place])   . Family history of breast cancer   . Family history of colon cancer   . Family history of ovarian cancer   . Family history of stomach cancer   . Arthritis     bil thumb joints  .  Dysrhythmia     fast HR at times due to stress, no chest pain or SOB    PAST SURGICAL HISTORY: Past Surgical History  Procedure Laterality Date  . Wisdom tooth extraction    . Radioactive seed guided mastectomy with axillary sentinel lymph node biopsy Right 05/29/2015    Procedure: RADIOACTIVE SEED RIGHT BREAST LUMPECTOMY  WITH AXILLARY SENTINEL LYMPH NODE BIOPSY;  Surgeon: Glenna Fellows, MD;  Location: [facility];  Service: General;  Laterality: Right;  . Re-excision of breast lumpectomy Right 06/12/2015    Procedure: RE-EXCISION OF RIGHT  BREAST LUMPECTOMY;  Surgeon: Glenna Fellows, MD;  Location: [facility];  Service: General;  Laterality: Right;  . Portacath placement Right 06/27/2015    Procedure: INSERTION PORT-A-CATH;  Surgeon: Glenna Fellows, MD;  Location: [facility];  Service: General;  Laterality: Right;    FAMILY HISTORY Family History  Problem Relation Age of Onset  . Breast cancer Sister 47  . Colon cancer Father 57  . Cancer Maternal Grandmother     probable ovarian cancer  . Stomach cancer Paternal Grandmother 80  . Brain cancer Other 10    Medulloblastoma  . Parkinson's disease Mother   . Lung cancer Paternal Uncle     smoker  . Stomach cancer Other     PGMs sister  . Stomach cancer Other     PMGs brother   the patient's father is still living, at age 77. The patient's mother died from Parkinson's disease complications at age 51. The patient had 5 brothers, 4 sisters. One sister was diagnosed with breast cancer at age 66 and died at age 13. The patient's father was diagnosed with colon cancer at the age of 54. A nephew was diagnosed with medulloblastoma at age 57 and died at age 73. The paternal grandmother was diagnosed with stomach cancer at age 58. The maternal grandmother was diagnosed with metastatic cancer of unknown type at age 67. (The description is suggestive of an ovarian cancer).  GYNECOLOGIC HISTORY:  No LMP recorded. Patient is postmenopausal. Menarche age 3. The patient is GX P0. She stopped having periods in 2014. She did not take hormone replacement. She took oral contraceptives for approximately 20 years with no complications.  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 Her husband Lorin Picket  works for a Technical sales engineer in [employer] "[employer] and [employer] credited Metallurgist". Lorin Picket has 2 daughters from an earlier marriage, Alphonsa Overall lives in [place] and works in recruiting, and Kristan Brummitt lives in [place] and works in Audiological scientist. They have 1 grandchild. The patient attends the [employer]    ADVANCED DIRECTIVES: In place   HEALTH MAINTENANCE: Social History  Substance Use Topics  . Smoking status: Never Smoker   . Smokeless tobacco: Not on file  . Alcohol Use: Yes     Comment: social     Colonoscopy: 2014/lobe are  PAP: 2015  Bone density: Remote  Lipid panel:  No Known Allergies  Current Outpatient Prescriptions  Medication Sig Dispense Refill  . dexamethasone (DECADRON) 4 MG tablet Take 2 tablets (8 mg total) by mouth 2 (two) times daily. Start the day before Taxotere. Then again the day after chemo for 3 days. 30 tablet 1  . lidocaine-prilocaine (EMLA) cream Apply to affected area once 30 g 3  . LORazepam (ATIVAN) 0.5 MG tablet Take 1 tablet (0.5 mg total) by mouth at bedtime as needed (Nausea or vomiting). 30 tablet  0  . ondansetron (ZOFRAN) 8 MG tablet Take 1 tablet (8 mg total) by mouth 2 (two) times daily. Start the day after chemo for 3 days. Then take as needed for nausea or vomiting. 30 tablet 1  . tobramycin-dexamethasone (TOBRADEX) ophthalmic solution Place 1 drop into both eyes 2 (two) times daily. 5 mL 0  . anastrozole (ARIMIDEX) 1 MG tablet Take 1 tablet (1 mg total) by mouth daily. (Patient not taking: Reported on 06/28/2015) 90 tablet 4  . cetirizine (ZYRTEC) 10 MG tablet Take 10 mg by mouth daily. Reported on 07/05/2015    . halobetasol (ULTRAVATE) 0.05 % ointment Apply 1 application topically daily as needed (eczema). Reported on 07/05/2015    . prochlorperazine (COMPAZINE) 10 MG tablet Take 1 tablet (10 mg total) by mouth every 6 (six) hours as needed (Nausea or vomiting). (Patient not taking: Reported on 07/05/2015) 30 tablet 1   No  current facility-administered medications for this visit.    OBJECTIVE: Middle-aged white woman who appears well Filed Vitals:   07/05/15 1308  BP: 106/67  Pulse: 90  Temp: 99.1 F (37.3 C)  Resp: 18     Body mass index is 24.72 kg/(m^2).    ECOG FS:0 - Asymptomatic  Sclerae unicteric, pupils round and equal Oropharynx clear and moist-- no thrush or other lesions No cervical or supraclavicular adenopathy Lungs no rales or rhonchi Heart regular rate and rhythm Abd soft, nontender, positive bowel sounds MSK no focal spinal tenderness, no upper extremity lymphedema Neuro: nonfocal, well oriented, appropriate affect Breasts: deferred  LAB RESULTS:  CMP     Component Value Date/Time   NA 140 07/05/2015 1253   K 6.2* 07/05/2015 1253   CO2 24 07/05/2015 1253   GLUCOSE 76 07/05/2015 1253   BUN 14.8 07/05/2015 1253   CREATININE 1.0 07/05/2015 1253   CALCIUM 9.5 07/05/2015 1253   PROT 6.9 07/05/2015 1253   ALBUMIN 3.8 07/05/2015 1253   AST 26 07/05/2015 1253   ALT 21 07/05/2015 1253   ALKPHOS 133 07/05/2015 1253   BILITOT 0.66 07/05/2015 1253    INo results found for: SPEP, UPEP  Lab Results  Component Value Date   WBC 25.6* 07/05/2015   NEUTROABS 19.6* 07/05/2015   HGB 14.6 07/05/2015   HCT 44.5 07/05/2015   MCV 89.5 07/05/2015   PLT 229 07/05/2015      Chemistry      Component Value Date/Time   NA 140 07/05/2015 1253   K 6.2* 07/05/2015 1253   CO2 24 07/05/2015 1253   BUN 14.8 07/05/2015 1253   CREATININE 1.0 07/05/2015 1253      Component Value Date/Time   CALCIUM 9.5 07/05/2015 1253   ALKPHOS 133 07/05/2015 1253   AST 26 07/05/2015 1253   ALT 21 07/05/2015 1253   BILITOT 0.66 07/05/2015 1253       No results found for: LABCA2  No components found for: LABCA125  No results for input(s): INR in the last 168 hours.  Urinalysis No results found for: COLORURINE, APPEARANCEUR, LABSPEC, PHURINE, GLUCOSEU, HGBUR, BILIRUBINUR, KETONESUR, PROTEINUR,  UROBILINOGEN, NITRITE, LEUKOCYTESUR  STUDIES: Dg Chest Portable 1 View  06/27/2015  CLINICAL DATA:  Status post port placement EXAM: PORTABLE CHEST - 1 VIEW COMPARISON:  None. FINDINGS: Left chest wall port is seen. Catheter tip is noted in the mid superior vena cava. No pneumothorax is seen. The cardiac shadow is stable. Postsurgical changes are noted in the right breast. IMPRESSION: No pneumothorax following port placement. Electronically Signed  By: Inez Catalina M.D.   On: 06/27/2015 15:55   Dg Fluoro Guide Cv Line-no Report  06/27/2015  CLINICAL DATA:  FLOURO GUIDE CV LINE Fluoroscopy was utilized by the requesting physician.  No radiographic interpretation.    ASSESSMENT: 59 y.o. [place] woman status post right breast biopsy 02/02/2015 for a clinically multifocal T1c NX, stage IA invasive ductal carcinoma, grade 2 or 3, estrogen and progesterone receptor positive, with HER-2 not amplified and the MIB-1 at 40%  (1) biopsy of a suspicious left breast mass 03/28/2015 showed only usual ductal hyperplasia, no malignancy identified.  (2) genetics testing 03/06/2015 through the Breast/Ovarian gene panel offered by [facility] found no deleterious mutations in ATM, BARD1, BRCA1, BRCA2, BRIP1, CDH1, CHEK2, EPCAM, FANCC, MLH1, MSH2, MSH6, NBN, PALB2, PMS2, PTEN, RAD51C, RAD51D, TP53, and XRCC2.  (a) there was a Variant of Unknown Significance in the BRCA2 gene called [id number]  (3) status post right lumpectomy and sentinel lymph node sampling 05/29/2015 for an mpT1b pN0, stage IA invasive ductal carcinoma, grade 2, with positive margins  (a) margins cleared with subsequent surgery 06/12/2015  (3) Oncotype score of 35 predicts a risk of outside the breast recurrence within 10 years of 24% if the patient's only systemic therapy is tamoxifen for 5 years. It also predicts significant benefit from chemotherapy.  (4) adjuvant chemotherapy to consist of cyclophosphamide and docetaxel 4, given 3 weeks  apart, starting 06/27/2015.  (5) FISH panel returned HER-2 positive. Will begin trastuzumab every 3 weeks through 1 year, starting 07/18/14.   (6) adjuvant radiation to follow chemotherapy  (7) received anastrozole  02/08/2015 through December 2016, to resume at the completion of radiation  PLAN: Catherine Lawson performed remarkably well with her first cycle of chemo. With some small adjustments, I think she will be able to improve her experience. She will drop her dose of dexamethasone at home to just 19m BID. This should improve the palpitations. She will also experiment with using compazine over zofran, as this may have contributed to the pressure she felt to her head. Finally she will increase her use of ibuprofen to at least 405mevery 6-8 hours for her bone pain. Her original dose may have been too low.  The labs were reviewed in detail. The leukocytosis is going to be related to the use of neulasta. She also had a potassium of 6.2, which seemed out of place given the rest of the electrolytes and her kidney function were within normal range, however she did experience palpitations so there is appropriate concern. I am having her sent back to the lab to have this retested, in case the hyperkalemia is the result of hemolysis of the blood sample. If not, I will put her on lasix 1040mID for the next couple of days and allow the act of diuresis to bring the potassium down.  PatChong Sicilianll return in 2 weeks for cycle 2 of treatment along with her cycle of trastuzumab. She understands and agrees with this plan. She knows the goal of treatment in her case is cure. She has been encouraged to call with any issues that might arise before her next visit here.   HeaLaurie PandaP   07/05/2015 1:56 PM   ADDENDUM: potassium level recheck returned at 5.4. Will continue to monitor this value. Patient is already scheduled for an echocardiogram on 12/27.

## 2015-07-05 NOTE — Progress Notes (Signed)
Patient had a critical potassium today of 6.2  Gentry Fitz, NP aware and is seeing patient today.

## 2015-07-06 ENCOUNTER — Other Ambulatory Visit: Payer: Self-pay | Admitting: Oncology

## 2015-07-11 ENCOUNTER — Ambulatory Visit (HOSPITAL_COMMUNITY)
Admission: RE | Admit: 2015-07-11 | Discharge: 2015-07-11 | Disposition: A | Discharge status: Home / Self Care | Payer: Managed Care, Other (non HMO) | Source: Ambulatory Visit | Attending: Oncology | Admitting: Oncology

## 2015-07-11 DIAGNOSIS — Z09 Encounter for follow-up examination after completed treatment for conditions other than malignant neoplasm: Secondary | ICD-10-CM | POA: Insufficient documentation

## 2015-07-11 DIAGNOSIS — C50411 Malignant neoplasm of upper-outer quadrant of right female breast: Secondary | ICD-10-CM | POA: Insufficient documentation

## 2015-07-11 NOTE — Progress Notes (Signed)
Echocardiogram 2D Echocardiogram has been performed.  Tresa Res 07/11/2015, 11:48 AM

## 2015-07-12 ENCOUNTER — Encounter: Payer: Self-pay | Admitting: *Deleted

## 2015-07-19 ENCOUNTER — Encounter: Payer: Self-pay | Admitting: Nurse Practitioner

## 2015-07-19 ENCOUNTER — Other Ambulatory Visit: Payer: Self-pay | Admitting: Oncology

## 2015-07-19 ENCOUNTER — Other Ambulatory Visit: Payer: Managed Care, Other (non HMO)

## 2015-07-19 ENCOUNTER — Ambulatory Visit (HOSPITAL_BASED_OUTPATIENT_CLINIC_OR_DEPARTMENT_OTHER): Payer: Managed Care, Other (non HMO)

## 2015-07-19 ENCOUNTER — Other Ambulatory Visit (HOSPITAL_BASED_OUTPATIENT_CLINIC_OR_DEPARTMENT_OTHER): Payer: Managed Care, Other (non HMO)

## 2015-07-19 ENCOUNTER — Encounter: Payer: Self-pay | Admitting: *Deleted

## 2015-07-19 ENCOUNTER — Telehealth: Payer: Self-pay | Admitting: Nurse Practitioner

## 2015-07-19 ENCOUNTER — Ambulatory Visit (HOSPITAL_BASED_OUTPATIENT_CLINIC_OR_DEPARTMENT_OTHER): Payer: Managed Care, Other (non HMO) | Admitting: Nurse Practitioner

## 2015-07-19 VITALS — BP 118/66 | HR 80 | Temp 98.4°F | Resp 20 | Ht 64.0 in | Wt 147.3 lb

## 2015-07-19 DIAGNOSIS — C50411 Malignant neoplasm of upper-outer quadrant of right female breast: Secondary | ICD-10-CM

## 2015-07-19 DIAGNOSIS — Z5111 Encounter for antineoplastic chemotherapy: Secondary | ICD-10-CM | POA: Diagnosis not present

## 2015-07-19 DIAGNOSIS — Z95828 Presence of other vascular implants and grafts: Secondary | ICD-10-CM

## 2015-07-19 DIAGNOSIS — Z5189 Encounter for other specified aftercare: Secondary | ICD-10-CM

## 2015-07-19 DIAGNOSIS — Z170 Estrogen receptor positive status [ER+]: Secondary | ICD-10-CM | POA: Diagnosis not present

## 2015-07-19 DIAGNOSIS — C50912 Malignant neoplasm of unspecified site of left female breast: Secondary | ICD-10-CM

## 2015-07-19 LAB — COMPREHENSIVE METABOLIC PANEL
ALBUMIN: 3.7 g/dL (ref 3.5–5.0)
ALK PHOS: 128 U/L (ref 40–150)
ALT: 19 U/L (ref 0–55)
AST: 14 U/L (ref 5–34)
Anion Gap: 10 mEq/L (ref 3–11)
BILIRUBIN TOTAL: 0.38 mg/dL (ref 0.20–1.20)
BUN: 12.2 mg/dL (ref 7.0–26.0)
CO2: 22 meq/L (ref 22–29)
CREATININE: 0.8 mg/dL (ref 0.6–1.1)
Calcium: 9 mg/dL (ref 8.4–10.4)
Chloride: 111 mEq/L — ABNORMAL HIGH (ref 98–109)
EGFR: 85 mL/min/{1.73_m2} — AB (ref >90)
GLUCOSE: 140 mg/dL (ref 70–140)
Potassium: 4 mEq/L (ref 3.5–5.1)
SODIUM: 143 meq/L (ref 136–145)
TOTAL PROTEIN: 6.8 g/dL (ref 6.4–8.3)

## 2015-07-19 LAB — CBC WITH DIFFERENTIAL/PLATELET
BASO%: 0.6 % (ref 0.0–2.0)
BASOS ABS: 0.1 10*3/uL (ref 0.0–0.1)
EOS ABS: 0 10*3/uL (ref 0.0–0.5)
EOS%: 0 % (ref 0.0–7.0)
HCT: 37.7 % (ref 34.8–46.6)
HEMOGLOBIN: 12.9 g/dL (ref 11.6–15.9)
LYMPH%: 11.2 % — ABNORMAL LOW (ref 14.0–49.7)
MCH: 30.4 pg (ref 25.1–34.0)
MCHC: 34.2 g/dL (ref 31.5–36.0)
MCV: 89.1 fL (ref 79.5–101.0)
MONO#: 0.4 10*3/uL (ref 0.1–0.9)
MONO%: 3.4 % (ref 0.0–14.0)
NEUT#: 9.3 10*3/uL — ABNORMAL HIGH (ref 1.5–6.5)
NEUT%: 84.8 % — ABNORMAL HIGH (ref 38.4–76.8)
PLATELETS: 478 10*3/uL — AB (ref 145–400)
RBC: 4.23 10*6/uL (ref 3.70–5.45)
RDW: 13.4 % (ref 11.2–14.5)
WBC: 11 10*3/uL — ABNORMAL HIGH (ref 3.9–10.3)
lymph#: 1.2 10*3/uL (ref 0.9–3.3)

## 2015-07-19 MED ORDER — SODIUM CHLORIDE 0.9 % IJ SOLN
10.0000 mL | INTRAMUSCULAR | Status: DC | PRN
  Administered 2015-07-19: 10 mL
  Filled 2015-07-19: qty 10

## 2015-07-19 MED ORDER — SODIUM CHLORIDE 0.9 % IV SOLN
600.0000 mg/m2 | Freq: Once | INTRAVENOUS | Status: AC
  Administered 2015-07-19: 1040 mg via INTRAVENOUS
  Filled 2015-07-19: qty 52

## 2015-07-19 MED ORDER — DEXAMETHASONE SODIUM PHOSPHATE 100 MG/10ML IJ SOLN
Freq: Once | INTRAMUSCULAR | Status: AC
  Administered 2015-07-19: 10:00:00 via INTRAVENOUS
  Filled 2015-07-19: qty 8

## 2015-07-19 MED ORDER — HEPARIN SOD (PORK) LOCK FLUSH 100 UNIT/ML IV SOLN
500.0000 [IU] | Freq: Once | INTRAVENOUS | Status: AC | PRN
  Administered 2015-07-19: 500 [IU]
  Filled 2015-07-19: qty 5

## 2015-07-19 MED ORDER — PEGFILGRASTIM 6 MG/0.6ML ~~LOC~~ PSKT
6.0000 mg | PREFILLED_SYRINGE | Freq: Once | SUBCUTANEOUS | Status: AC
  Administered 2015-07-19: 6 mg via SUBCUTANEOUS
  Filled 2015-07-19: qty 0.6

## 2015-07-19 MED ORDER — SODIUM CHLORIDE 0.9 % IJ SOLN
10.0000 mL | INTRAMUSCULAR | Status: DC | PRN
  Administered 2015-07-19: 10 mL via INTRAVENOUS
  Filled 2015-07-19: qty 10

## 2015-07-19 MED ORDER — DOCETAXEL CHEMO INJECTION 160 MG/16ML
75.0000 mg/m2 | Freq: Once | INTRAVENOUS | Status: AC
  Administered 2015-07-19: 130 mg via INTRAVENOUS
  Filled 2015-07-19: qty 13

## 2015-07-19 MED ORDER — SODIUM CHLORIDE 0.9 % IV SOLN
Freq: Once | INTRAVENOUS | Status: AC
  Administered 2015-07-19: 10:00:00 via INTRAVENOUS

## 2015-07-19 NOTE — Progress Notes (Addendum)
[place]  [phone]) [phone] [phone]     ID: Catherine Lawson DOB: April 23, 1956  MR#: [id number]  GYF#:[id number]  Patient Care Team: Cari Caraway, MD as PCP - General (Family Medicine) Excell Seltzer, MD as Consulting Physician (General Surgery) Chauncey Cruel, MD as Consulting Physician (Oncology) Kyung Rudd, MD as Consulting Physician (Radiation Oncology) Mauro Kaufmann, RN as Registered Nurse Rockwell Germany, RN as Registered Nurse Sylvan Cheese, NP as Nurse Practitioner (Nurse Practitioner) Vania Rea, MD as Consulting Physician (Obstetrics and Gynecology) PCP: Cari Caraway, MD OTHER MD:  CHIEF COMPLAINT: Estrogen receptor positive breast cancer  CURRENT TREATMENT: Adjuvant chemotherapy  BREAST CANCER HISTORY: From the original intake note:     "Catherine Lawson" had routine screening mammography at [facility] 01/24/2015. This showed a 1.6 cm mass in the right breast at the 11:00 position, and on 0.8 cm irregular density also at the 11:00 position farther away from the nipple. On 02/01/2015 she underwent a unilateral right diagnostic mammography at [facility]. The breast density was category B. In addition to the 2 masses previously noted there was a cluster of calcifications at the 9:00 position. Ultrasonography the same day showed a 1.9 cm oval mass, which was hypoechoic with no vascularity and hard on L a Stogner if he, a 7 mm mass at the 10:00 position with no vascularity and intermediate L a Stogner 3, and a lymph node with uniform cortex thickening in the right axilla. The 2 masses in question were separated by 2 cm.  On 02/02/2015 the patient underwent biopsy of what appears to have been the larger of the 2 breast masses (I cannot locate the biopsy report). This documented invasive ductal carcinoma, grade 2 or 3, with micropapillary features, estrogen and progesterone receptor positive, with an MIB-1 of 40%, and HER-2 not amplified with a signals  ratio of 1.36 and a number per cell of 3.60. There was evidence of lymphovascular invasion.  Because of bleeding from the first biopsy, the second breast mass could not be performed. The lymph node was aspirated, not cord, and this appeared benign but the concordance is questionable.  The patient's subsequent history is as detailed below  INTERVAL HISTORY: Catherine Lawson returns today for follow-up of her breast cancer, alone. Today is day 1, cycle 2 of cyclophosphamide and docetaxel given every 21 days, with neulasta onpro given on day 2 for granulocyte support. She will also receive trastuzumab every 3 weeks, starting 1/25.  REVIEW OF SYSTEMS: Catherine Lawson has recovered well since her last visit. She is having no more palpitations or headaches. Her bone pain has subsided. She denies fevers, chills, nausea, vomiting, or changes in bowel or bladder habits. Her hair is shedding, but she still has a fair amount with few patches. Her appetite is back up and she continues to exercise regularly. She denies mouth sores or rashes. She has experienced no more tingling to her feet since the week of treatment. A detailed review of systems is otherwise stable.  PAST MEDICAL HISTORY: Past Medical History  Diagnosis Date  . Breast cancer of upper-outer quadrant of right female breast ([place]) 02/06/2015  . Breast cancer ([place])   . Family history of breast cancer   . Family history of colon cancer   . Family history of ovarian cancer   . Family history of stomach cancer   . Arthritis     bil thumb joints  . Dysrhythmia     fast HR at times due to stress, no chest pain or  SOB    PAST SURGICAL HISTORY: Past Surgical History  Procedure Laterality Date  . Wisdom tooth extraction    . Radioactive seed guided mastectomy with axillary sentinel lymph node biopsy Right 05/29/2015    Procedure: RADIOACTIVE SEED RIGHT BREAST LUMPECTOMY WITH AXILLARY SENTINEL LYMPH NODE BIOPSY;  Surgeon: Excell Seltzer, MD;  Location: [facility];  Service: General;  Laterality: Right;  . Re-excision of breast lumpectomy Right 06/12/2015    Procedure: RE-EXCISION OF RIGHT  BREAST LUMPECTOMY;  Surgeon: Excell Seltzer, MD;  Location: [facility];  Service: General;  Laterality: Right;  . Portacath placement Right 06/27/2015    Procedure: INSERTION PORT-A-CATH;  Surgeon: Excell Seltzer, MD;  Location: [facility];  Service: General;  Laterality: Right;    FAMILY HISTORY Family History  Problem Relation Age of Onset  . Breast cancer Sister 72  . Colon cancer Father 71  . Cancer Maternal Grandmother     probable ovarian cancer  . Stomach cancer Paternal Grandmother 28  . Brain cancer Other 10    Medulloblastoma  . Parkinson's disease Mother   . Lung cancer Paternal Uncle     smoker  . Stomach cancer Other     PGMs sister  . Stomach cancer Other     PMGs brother   the patient's father is still living, at age 32. The patient's mother died from Parkinson's disease complications at age 38. The patient had 5 brothers, 4 sisters. One sister was diagnosed with breast cancer at age 3 and died at age 32. The patient's father was diagnosed with colon cancer at the age of 20. A nephew was diagnosed with medulloblastoma at age 68 and died at age 10. The paternal grandmother was diagnosed with stomach cancer at age 37. The maternal grandmother was diagnosed with metastatic cancer of unknown type at age 42. (The description is suggestive of an ovarian cancer).  GYNECOLOGIC HISTORY:  No LMP recorded. Patient is postmenopausal. Menarche age 89. The patient is GX P0. She stopped having periods in 2014. She did not take hormone replacement. She took oral contraceptives for approximately 20 years with no complications.  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 Her husband [name] works for a Merchandiser, retail in [employer] [employer] credited Warehouse manager". [name] has 2 daughters  from an earlier marriage, Ronalee Belts lives in [place] and works in recruiting, and Jamile Rekowski lives in [place] and works in Press photographer. They have 1 grandchild. The patient attends the [place]   HEALTH MAINTENANCE: Social History  Substance Use Topics  . Smoking status: Never Smoker   . Smokeless tobacco: Not on file  . Alcohol Use: Yes     Comment: social     Colonoscopy: 2014/lobe are  PAP: 2015  Bone density: Remote  Lipid panel:  No Known Allergies  Current Outpatient Prescriptions  Medication Sig Dispense Refill  . dexamethasone (DECADRON) 4 MG tablet Take 2 tablets (8 mg total) by mouth 2 (two) times daily. Start the day before Taxotere. Then again the day after chemo for 3 days. (Patient taking differently: Take 4 mg by mouth 2 (two) times daily. Start the day before Taxotere. Then again the day after chemo for 3 days.) 30 tablet 1  . ibuprofen (ADVIL,MOTRIN) 200 MG tablet Take 200 mg by mouth every 6 (six) hours as needed. Pt takes med days 5 - 7  after  chemo; 2 tablets every 6-8 hours as needed    . lidocaine-prilocaine (EMLA) cream Apply to affected area once 30 g 3  . LORazepam (ATIVAN) 0.5 MG tablet Take 1 tablet (0.5 mg total) by mouth at bedtime as needed (Nausea or vomiting). 30 tablet 0  . ondansetron (ZOFRAN) 8 MG tablet Take 1 tablet (8 mg total) by mouth 2 (two) times daily. Start the day after chemo for 3 days. Then take as needed for nausea or vomiting. 30 tablet 1  . anastrozole (ARIMIDEX) 1 MG tablet Take 1 tablet (1 mg total) by mouth daily. (Patient not taking: Reported on 06/28/2015) 90 tablet 4  . cetirizine (ZYRTEC) 10 MG tablet Take 10 mg by mouth daily. Reported on 07/19/2015    . halobetasol (ULTRAVATE) 0.05 % ointment Apply 1 application topically daily as needed (eczema). Reported on 07/19/2015    . prochlorperazine (COMPAZINE) 10 MG tablet Take 1 tablet (10 mg total) by mouth every 6 (six) hours as  needed (Nausea or vomiting). (Patient not taking: Reported on 07/05/2015) 30 tablet 1  . tobramycin-dexamethasone (TOBRADEX) ophthalmic solution Place 1 drop into both eyes 2 (two) times daily. (Patient not taking: Reported on 07/19/2015) 5 mL 0   No current facility-administered medications for this visit.    OBJECTIVE: Middle-aged white woman who appears well Filed Vitals:   07/19/15 0854  BP: 118/66  Pulse: 80  Temp: 98.4 F (36.9 C)  Resp: 20     Body mass index is 25.27 kg/(m^2).    ECOG FS:0 - Asymptomatic  Skin: warm, dry  HEENT: sclerae anicteric, conjunctivae pink, oropharynx clear. No thrush or mucositis.  Lymph Nodes: No cervical or supraclavicular lymphadenopathy  Lungs: clear to auscultation bilaterally, no rales, wheezes, or rhonci  Heart: regular rate and rhythm  Abdomen: round, soft, non tender, positive bowel sounds  Musculoskeletal: No focal spinal tenderness, no peripheral edema  Neuro: non focal, well oriented, positive affect  Breasts: deferred  LAB RESULTS:  CMP     Component Value Date/Time   NA 143 07/19/2015 0826   K 4.0 07/19/2015 0826   CO2 22 07/19/2015 0826   GLUCOSE 140 07/19/2015 0826   BUN 12.2 07/19/2015 0826   CREATININE 0.8 07/19/2015 0826   CALCIUM 9.0 07/19/2015 0826   PROT 6.8 07/19/2015 0826   ALBUMIN 3.7 07/19/2015 0826   AST 14 07/19/2015 0826   ALT 19 07/19/2015 0826   ALKPHOS 128 07/19/2015 0826   BILITOT 0.38 07/19/2015 0826    INo results found for: SPEP, UPEP  Lab Results  Component Value Date   WBC 11.0* 07/19/2015   NEUTROABS 9.3* 07/19/2015   HGB 12.9 07/19/2015   HCT 37.7 07/19/2015   MCV 89.1 07/19/2015   PLT 478* 07/19/2015      Chemistry      Component Value Date/Time   NA 143 07/19/2015 0826   K 4.0 07/19/2015 0826   CO2 22 07/19/2015 0826   BUN 12.2 07/19/2015 0826   CREATININE 0.8 07/19/2015 0826      Component Value Date/Time   CALCIUM 9.0 07/19/2015 0826   ALKPHOS 128 07/19/2015 0826   AST 14  07/19/2015 0826   ALT 19 07/19/2015 0826   BILITOT 0.38 07/19/2015 0826       No results found for: LABCA2  No components found for: LABCA125  No results for input(s): INR in the last 168 hours.  Urinalysis No results found for: COLORURINE, APPEARANCEUR, [place], [place], [place], [place], [place], [place], [place], [place], NITRITE, LEUKOCYTESUR  STUDIES: Dg Chest Portable 1 View  06/27/2015  CLINICAL DATA:  Status post port placement EXAM: PORTABLE CHEST - 1 VIEW COMPARISON:  None. FINDINGS: Left chest wall port is seen. Catheter tip is noted in the mid superior vena cava. No pneumothorax is seen. The cardiac shadow is stable. Postsurgical changes are noted in the right breast. IMPRESSION: No pneumothorax following port placement. Electronically Signed   By: Inez Catalina M.D.   On: 06/27/2015 15:55   Dg Fluoro Guide Cv Line-no Report  06/27/2015  CLINICAL DATA:  FLOURO GUIDE CV LINE Fluoroscopy was utilized by the requesting physician.  No radiographic interpretation.    ASSESSMENT: 61 y.o. [place] woman status post right breast biopsy 02/02/2015 for a clinically multifocal T1c NX, stage IA invasive ductal carcinoma, grade 2 or 3, estrogen and progesterone receptor positive, with HER-2 not amplified and the MIB-1 at 40%  (1) biopsy of a suspicious left breast mass 03/28/2015 showed only usual ductal hyperplasia, no malignancy identified.  (2) genetics testing 03/06/2015 through the Breast/Ovarian gene panel offered by [facility] found no deleterious mutations in ATM, BARD1, BRCA1, BRCA2, BRIP1, CDH1, CHEK2, EPCAM, FANCC, MLH1, MSH2, MSH6, NBN, PALB2, PMS2, PTEN, RAD51C, RAD51D, TP53, and XRCC2.  (a) there was a Variant of Unknown Significance in the BRCA2 gene called [id number]  (3) status post right lumpectomy and sentinel lymph node sampling 05/29/2015 for an mpT1b pN0, stage IA invasive ductal carcinoma, grade 2, with positive margins  (a) margins cleared with  subsequent surgery 06/12/2015  (3) Oncotype score of 35 predicts a risk of outside the breast recurrence within 10 years of 24% if the patient's only systemic therapy is tamoxifen for 5 years. It also predicts significant benefit from chemotherapy.  (4) adjuvant chemotherapy to consist of cyclophosphamide and docetaxel 4, given 3 weeks apart, starting 06/27/2015.  (5) FISH panel returned HER-2 positive. Will begin trastuzumab every 3 weeks through 1 year, starting 07/18/14.   (a) echocardiogram on 07/11/15 showed an EF of 60-65%  (6) adjuvant radiation to follow chemotherapy  (7) received anastrozole 02/08/2015 through December 2016, to resume at the completion of radiation  PLAN: Catherine Lawson looks and feels well today. She is ready to tackle her next cycle of chemotherapy, keeping the small changes in mind from her first cycle. We discussed dropping her dexamethasone dose to 33m BID, and avoiding zofran if she notices more headaches. The labs were reviewed in detail and were stable. Her potassium is normal today. She had an echocardiogram on 12/27 that shows an EF of 60-65%. We will hold off on starting the trastuzumab until 1/25, which will be after she has had time to visit her cardiologist. She has had a history of dysrhythmias in the past.   PChong Sicilianwill return in 1 week for labs and a nadir visit. She understands and agrees with this plan. She knows the goal of treatment in her case is cure. She has been encouraged to call with any issues that might arise before her next visit here.   HLaurie Panda NP 07/19/2015

## 2015-07-19 NOTE — Progress Notes (Signed)
Per pt she does "not want to start Herceptin until after seeing heart specialist on 07/31/14" Clarified orders with Gentry Fitz NP and per Nira Conn it is okay to hold starting Herceptin per pt request. Pt to receive Taxotere and Cytoxan today.

## 2015-07-19 NOTE — Telephone Encounter (Signed)
Appointments made and adjusted and she will get a new avs in chemo

## 2015-07-19 NOTE — Patient Instructions (Signed)
[facility] Discharge Instructions for Patients Receiving Chemotherapy  Today you received the following chemotherapy agents;  Taxotere and Cytoxan.    To help prevent nausea and vomiting after your treatment, we encourage you to take your nausea medication as directed.     If you develop nausea and vomiting that is not controlled by your nausea medication, call the clinic.   BELOW ARE SYMPTOMS THAT SHOULD BE REPORTED IMMEDIATELY:  *FEVER GREATER THAN 100.5 F  *CHILLS WITH OR WITHOUT FEVER  NAUSEA AND VOMITING THAT IS NOT CONTROLLED WITH YOUR NAUSEA MEDICATION  *UNUSUAL SHORTNESS OF BREATH  *UNUSUAL BRUISING OR BLEEDING  TENDERNESS IN MOUTH AND THROAT WITH OR WITHOUT PRESENCE OF ULCERS  *URINARY PROBLEMS  *BOWEL PROBLEMS  UNUSUAL RASH Items with * indicate a potential emergency and should be followed up as soon as possible.  Feel free to call the clinic you have any questions or concerns. The clinic phone number is [phone].  Please show the CHEMO ALERT CARD at check-in to the Emergency Department and triage nurse.   

## 2015-07-19 NOTE — Patient Instructions (Signed)

## 2015-07-19 NOTE — Progress Notes (Signed)
Addendum from previous not pt heart specialist appointment is 08/01/15 not 07/31/14.

## 2015-07-26 ENCOUNTER — Telehealth: Payer: Self-pay

## 2015-07-26 ENCOUNTER — Other Ambulatory Visit (HOSPITAL_BASED_OUTPATIENT_CLINIC_OR_DEPARTMENT_OTHER): Payer: Managed Care, Other (non HMO)

## 2015-07-26 ENCOUNTER — Other Ambulatory Visit: Payer: Managed Care, Other (non HMO)

## 2015-07-26 ENCOUNTER — Ambulatory Visit (HOSPITAL_BASED_OUTPATIENT_CLINIC_OR_DEPARTMENT_OTHER): Payer: Managed Care, Other (non HMO) | Admitting: Nurse Practitioner

## 2015-07-26 ENCOUNTER — Encounter: Payer: Self-pay | Admitting: Nurse Practitioner

## 2015-07-26 ENCOUNTER — Other Ambulatory Visit: Payer: Self-pay | Admitting: *Deleted

## 2015-07-26 VITALS — BP 145/75 | HR 99 | Temp 98.1°F | Resp 17 | Ht 64.0 in | Wt 145.1 lb

## 2015-07-26 DIAGNOSIS — Z170 Estrogen receptor positive status [ER+]: Secondary | ICD-10-CM | POA: Diagnosis not present

## 2015-07-26 DIAGNOSIS — C50411 Malignant neoplasm of upper-outer quadrant of right female breast: Secondary | ICD-10-CM | POA: Diagnosis not present

## 2015-07-26 LAB — CBC WITH DIFFERENTIAL/PLATELET
BASO%: 0.7 % (ref 0.0–2.0)
Basophils Absolute: 0.2 10*3/uL — ABNORMAL HIGH (ref 0.0–0.1)
EOS ABS: 0.1 10*3/uL (ref 0.0–0.5)
EOS%: 0.4 % (ref 0.0–7.0)
HCT: 41.4 % (ref 34.8–46.6)
HEMOGLOBIN: 13.5 g/dL (ref 11.6–15.9)
LYMPH%: 10.9 % — ABNORMAL LOW (ref 14.0–49.7)
MCH: 29.6 pg (ref 25.1–34.0)
MCHC: 32.7 g/dL (ref 31.5–36.0)
MCV: 90.5 fL (ref 79.5–101.0)
MONO#: 3.6 10*3/uL — ABNORMAL HIGH (ref 0.1–0.9)
MONO%: 11.2 % (ref 0.0–14.0)
NEUT%: 76.8 % (ref 38.4–76.8)
NEUTROS ABS: 24.3 10*3/uL — AB (ref 1.5–6.5)
Platelets: 316 10*3/uL (ref 145–400)
RBC: 4.57 10*6/uL (ref 3.70–5.45)
RDW: 13.8 % (ref 11.2–14.5)
WBC: 31.7 10*3/uL — AB (ref 3.9–10.3)
lymph#: 3.5 10*3/uL — ABNORMAL HIGH (ref 0.9–3.3)

## 2015-07-26 LAB — COMPREHENSIVE METABOLIC PANEL
ALK PHOS: 143 U/L (ref 40–150)
ALT: 18 U/L (ref 0–55)
ANION GAP: 9 meq/L (ref 3–11)
AST: 30 U/L (ref 5–34)
Albumin: 3.9 g/dL (ref 3.5–5.0)
BUN: 16.4 mg/dL (ref 7.0–26.0)
CALCIUM: 9.4 mg/dL (ref 8.4–10.4)
CHLORIDE: 107 meq/L (ref 98–109)
CO2: 25 mEq/L (ref 22–29)
CREATININE: 1.1 mg/dL (ref 0.6–1.1)
EGFR: 56 mL/min/{1.73_m2} — ABNORMAL LOW (ref >90)
Glucose: 93 mg/dl (ref 70–140)
POTASSIUM: 5.6 meq/L — AB (ref 3.5–5.1)
Sodium: 141 mEq/L (ref 136–145)
Total Bilirubin: 0.44 mg/dL (ref 0.20–1.20)
Total Protein: 6.7 g/dL (ref 6.4–8.3)

## 2015-07-26 MED ORDER — DEXAMETHASONE 4 MG PO TABS: 4.0000 mg | ORAL_TABLET | Freq: Two times a day (BID) | ORAL | Status: DC

## 2015-07-26 NOTE — Progress Notes (Signed)
[place]  [phone]) [phone] [phone]) [phone]     ID: Catherine Lawson DOB: May 31, 1956  MR#: [id number]  YQM#:[id number]  Patient Care Team: Cari Caraway, MD as PCP - General (Family Medicine) Excell Seltzer, MD as Consulting Physician (General Surgery) Chauncey Cruel, MD as Consulting Physician (Oncology) Kyung Rudd, MD as Consulting Physician (Radiation Oncology) Mauro Kaufmann, RN as Registered Nurse Rockwell Germany, RN as Registered Nurse Sylvan Cheese, NP as Nurse Practitioner (Nurse Practitioner) Vania Rea, MD as Consulting Physician (Obstetrics and Gynecology) PCP: Cari Caraway, MD OTHER MD:  CHIEF COMPLAINT: Estrogen receptor positive breast cancer  CURRENT TREATMENT: Adjuvant chemotherapy  BREAST CANCER HISTORY: From the original intake note:     "Catherine Lawson" had routine screening mammography at [facility] 01/24/2015. This showed a 1.6 cm mass in the right breast at the 11:00 position, and on 0.8 cm irregular density also at the 11:00 position farther away from the nipple. On 02/01/2015 she underwent a unilateral right diagnostic mammography at [facility]. The breast density was category B. In addition to the 2 masses previously noted there was a cluster of calcifications at the 9:00 position. Ultrasonography the same day showed a 1.9 cm oval mass, which was hypoechoic with no vascularity and hard on L a Stogner if he, a 7 mm mass at the 10:00 position with no vascularity and intermediate L a Stogner 3, and a lymph node with uniform cortex thickening in the right axilla. The 2 masses in question were separated by 2 cm.  On 02/02/2015 the patient underwent biopsy of what appears to have been the larger of the 2 breast masses (I cannot locate the biopsy report). This documented invasive ductal carcinoma, grade 2 or 3, with micropapillary features, estrogen and progesterone receptor positive, with an MIB-1 of 40%, and HER-2 not amplified with a signals  ratio of 1.36 and a number per cell of 3.60. There was evidence of lymphovascular invasion.  Because of bleeding from the first biopsy, the second breast mass could not be performed. The lymph node was aspirated, not cord, and this appeared benign but the concordance is questionable.  The patient's subsequent history is as detailed below  INTERVAL HISTORY: Catherine Lawson returns today for follow-up of her breast cancer, alone. Today is day 8, cycle 2 of cyclophosphamide and docetaxel given every 21 days, with neulasta onpro given on day 2 for granulocyte support. She will also receive trastuzumab every 3 weeks, starting 1/25.  REVIEW OF SYSTEMS: This round went much better than the first. Dropping the steroid dose minimized the palpitations to almost none. She had a slight headache for a day or two, but this quickly resolved. She went ahead and buzzed her hair off, but she still has a fair amount of thick strands left. She denies fevers, chills, nausea, vomiting, or changes in bowel or bladder habits. Her appetite is good and she is getting in 64 oz of water every day, and making the gym near daily as well. She denies mouth sores or rashes. She had tingling to her left toes for a few hours, then this resolved on its own. Her bone pain was minimal when compared to cycle 1 a detailed review of systems is otherwise stable.  PAST MEDICAL HISTORY: Past Medical History  Diagnosis Date  . Breast cancer of upper-outer quadrant of right female breast ([place]) 02/06/2015  . Breast cancer ([place])   . Family history of breast cancer   . Family history of colon cancer   . Family  history of ovarian cancer   . Family history of stomach cancer   . Arthritis     bil thumb joints  . Dysrhythmia     fast HR at times due to stress, no chest pain or SOB    PAST SURGICAL HISTORY: Past Surgical History  Procedure Laterality Date  . Wisdom tooth extraction    . Radioactive seed guided mastectomy with axillary sentinel lymph  node biopsy Right 05/29/2015    Procedure: RADIOACTIVE SEED RIGHT BREAST LUMPECTOMY WITH AXILLARY SENTINEL LYMPH NODE BIOPSY;  Surgeon: Excell Seltzer, MD;  Location: [facility];  Service: General;  Laterality: Right;  . Re-excision of breast lumpectomy Right 06/12/2015    Procedure: RE-EXCISION OF RIGHT  BREAST LUMPECTOMY;  Surgeon: Excell Seltzer, MD;  Location: [facility];  Service: General;  Laterality: Right;  . Portacath placement Right 06/27/2015    Procedure: INSERTION PORT-A-CATH;  Surgeon: Excell Seltzer, MD;  Location: [facility];  Service: General;  Laterality: Right;    FAMILY HISTORY Family History  Problem Relation Age of Onset  . Breast cancer Sister 23  . Colon cancer Father 44  . Cancer Maternal Grandmother     probable ovarian cancer  . Stomach cancer Paternal Grandmother 32  . Brain cancer Other 10    Medulloblastoma  . Parkinson's disease Mother   . Lung cancer Paternal Uncle     smoker  . Stomach cancer Other     PGMs sister  . Stomach cancer Other     PMGs brother   the patient's father is still living, at age 82. The patient's mother died from Parkinson's disease complications at age 68. The patient had 5 brothers, 4 sisters. One sister was diagnosed with breast cancer at age 67 and died at age 2. The patient's father was diagnosed with colon cancer at the age of 60. A nephew was diagnosed with medulloblastoma at age 61 and died at age 27. The paternal grandmother was diagnosed with stomach cancer at age 55. The maternal grandmother was diagnosed with metastatic cancer of unknown type at age 53. (The description is suggestive of an ovarian cancer).  GYNECOLOGIC HISTORY:  No LMP recorded. Patient is postmenopausal. Menarche age 26. The patient is GX P0. She stopped having periods in 2014. She did not take hormone replacement. She took oral contraceptives for approximately 20 years with no complications.  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 Her husband [name] works for a Merchandiser, retail in [employer] "[place] [employer] credited Warehouse manager". [name] has 2 daughters from an earlier marriage, Ronalee Belts lives in [place] and works in recruiting, and Jaziah Kwasnik lives in [place] and works in Press photographer. They have 1 grandchild. The patient attends the [employer]   HEALTH MAINTENANCE: Social History  Substance Use Topics  . Smoking status: Never Smoker   . Smokeless tobacco: Not on file  . Alcohol Use: Yes     Comment: social     Colonoscopy: 2014/lobe are  PAP: 2015  Bone density: Remote  Lipid panel:  No Known Allergies  Current Outpatient Prescriptions  Medication Sig Dispense Refill  . cetirizine (ZYRTEC) 10 MG tablet Take 10 mg by mouth daily. Reported on 07/19/2015    . ibuprofen (ADVIL,MOTRIN) 200 MG tablet Take 200 mg by mouth. Pt takes med days 5 - 7  after chemo; 2 tablets every 6-8 hours as needed    .  lidocaine-prilocaine (EMLA) cream Apply to affected area once 30 g 3  . LORazepam (ATIVAN) 0.5 MG tablet Take 1 tablet (0.5 mg total) by mouth at bedtime as needed (Nausea or vomiting). 30 tablet 0  . ondansetron (ZOFRAN) 8 MG tablet Take 1 tablet (8 mg total) by mouth 2 (two) times daily. Start the day after chemo for 3 days. Then take as needed for nausea or vomiting. 30 tablet 1  . anastrozole (ARIMIDEX) 1 MG tablet Take 1 tablet (1 mg total) by mouth daily. (Patient not taking: Reported on 06/28/2015) 90 tablet 4  . dexamethasone (DECADRON) 4 MG tablet Take 1 tablet (4 mg total) by mouth 2 (two) times daily. Start the day before Taxotere. Then again the day after chemo for 3 days. 30 tablet 1  . halobetasol (ULTRAVATE) 0.05 % ointment Apply 1 application topically daily as needed (eczema). Reported on 07/26/2015    . prochlorperazine (COMPAZINE) 10 MG tablet Take 1 tablet (10 mg total) by mouth every 6 (six) hours  as needed (Nausea or vomiting). (Patient not taking: Reported on 07/05/2015) 30 tablet 1  . tobramycin-dexamethasone (TOBRADEX) ophthalmic solution Place 1 drop into both eyes 2 (two) times daily. (Patient not taking: Reported on 07/19/2015) 5 mL 0   No current facility-administered medications for this visit.    OBJECTIVE: Middle-aged white woman who appears well Filed Vitals:   07/26/15 1427  BP: 145/75  Pulse: 99  Temp: 98.1 F (36.7 C)  Resp: 17     Body mass index is 24.89 kg/(m^2).    ECOG FS:0 - Asymptomatic  Sclerae unicteric, pupils round and equal Oropharynx clear and moist-- no thrush or other lesions No cervical or supraclavicular adenopathy Lungs no rales or rhonchi Heart regular rate and rhythm Abd soft, nontender, positive bowel sounds MSK no focal spinal tenderness, no upper extremity lymphedema Neuro: nonfocal, well oriented, appropriate affect Breasts: deferred  LAB RESULTS:  CMP     Component Value Date/Time   NA 141 07/26/2015 1419   K 5.6* 07/26/2015 1419   CO2 25 07/26/2015 1419   GLUCOSE 93 07/26/2015 1419   BUN 16.4 07/26/2015 1419   CREATININE 1.1 07/26/2015 1419   CALCIUM 9.4 07/26/2015 1419   PROT 6.7 07/26/2015 1419   ALBUMIN 3.9 07/26/2015 1419   AST 30 07/26/2015 1419   ALT 18 07/26/2015 1419   ALKPHOS 143 07/26/2015 1419   BILITOT 0.44 07/26/2015 1419    INo results found for: SPEP, UPEP  Lab Results  Component Value Date   WBC 31.7* 07/26/2015   NEUTROABS 24.3* 07/26/2015   HGB 13.5 07/26/2015   HCT 41.4 07/26/2015   MCV 90.5 07/26/2015   PLT 316 07/26/2015      Chemistry      Component Value Date/Time   NA 141 07/26/2015 1419   K 5.6* 07/26/2015 1419   CO2 25 07/26/2015 1419   BUN 16.4 07/26/2015 1419   CREATININE 1.1 07/26/2015 1419      Component Value Date/Time   CALCIUM 9.4 07/26/2015 1419   ALKPHOS 143 07/26/2015 1419   AST 30 07/26/2015 1419   ALT 18 07/26/2015 1419   BILITOT 0.44 07/26/2015 1419        No results found for: LABCA2  No components found for: LABCA125  No results for input(s): INR in the last 168 hours.  Urinalysis No results found for: COLORURINE, APPEARANCEUR, LABSPEC, PHURINE, GLUCOSEU, HGBUR, BILIRUBINUR, KETONESUR, PROTEINUR, UROBILINOGEN, NITRITE, LEUKOCYTESUR  STUDIES: Dg Chest Portable 1 View  06/27/2015  CLINICAL DATA:  Status post port placement EXAM: PORTABLE CHEST - 1 VIEW COMPARISON:  None. FINDINGS: Left chest wall port is seen. Catheter tip is noted in the mid superior vena cava. No pneumothorax is seen. The cardiac shadow is stable. Postsurgical changes are noted in the right breast. IMPRESSION: No pneumothorax following port placement. Electronically Signed   By: Inez Catalina M.D.   On: 06/27/2015 15:55   Dg Fluoro Guide Cv Line-no Report  06/27/2015  CLINICAL DATA:  FLOURO GUIDE CV LINE Fluoroscopy was utilized by the requesting physician.  No radiographic interpretation.    ASSESSMENT: 60 y.o. [place] woman status post right breast biopsy 02/02/2015 for a clinically multifocal T1c NX, stage IA invasive ductal carcinoma, grade 2 or 3, estrogen and progesterone receptor positive, with HER-2 not amplified and the MIB-1 at 40%  (1) biopsy of a suspicious left breast mass 03/28/2015 showed only usual ductal hyperplasia, no malignancy identified.  (2) genetics testing 03/06/2015 through the Breast/Ovarian gene panel offered by [facility] found no deleterious mutations in ATM, BARD1, BRCA1, BRCA2, BRIP1, CDH1, CHEK2, EPCAM, FANCC, MLH1, MSH2, MSH6, NBN, PALB2, PMS2, PTEN, RAD51C, RAD51D, TP53, and XRCC2.  (a) there was a Variant of Unknown Significance in the BRCA2 gene called c.[id number]  (3) status post right lumpectomy and sentinel lymph node sampling 05/29/2015 for an mpT1b pN0, stage IA invasive ductal carcinoma, grade 2, with positive margins  (a) margins cleared with subsequent surgery 06/12/2015  (3) Oncotype score of 35 predicts a risk of  outside the breast recurrence within 10 years of 24% if the patient's only systemic therapy is tamoxifen for 5 years. It also predicts significant benefit from chemotherapy.  (4) adjuvant chemotherapy to consist of cyclophosphamide and docetaxel 4, given 3 weeks apart, starting 06/27/2015.  (5) FISH panel returned HER-2 positive. Will begin trastuzumab every 3 weeks through 1 year, starting 07/18/14.   (a) echocardiogram on 07/11/15 showed an EF of 60-65%  (6) adjuvant radiation to follow chemotherapy  (7) received anastrozole 02/08/2015 through December 2016, to resume at the completion of radiation  PLAN: Catherine Lawson is having achieving a remarkable performance level while on treatment. Her side effects are minimal and she is still finding the energy to workout nearly daily. The labs were reviewed in detail and were stable.   She meets with her cardiologist next week prior to the start of trastuzumab with the next cycle.  Catherine Lawson will return in 2 weeks for cycle 3 of treatment. She understands and agrees with this plan. She knows the goal of treatment in her case is cure. She has been encouraged to call with any issues that might arise before her next visit here.   Laurie Panda, NP 07/26/2015

## 2015-07-26 NOTE — Telephone Encounter (Signed)
Pam a case Freight forwarder at [facility] was unable to reach pt. I gave Pam a corrected pt phone number.

## 2015-08-01 ENCOUNTER — Encounter (HOSPITAL_COMMUNITY): Payer: Self-pay | Admitting: Internal Medicine

## 2015-08-01 ENCOUNTER — Ambulatory Visit (HOSPITAL_COMMUNITY)
Admission: RE | Admit: 2015-08-01 | Discharge: 2015-08-01 | Disposition: A | Discharge status: Home / Self Care | Payer: Managed Care, Other (non HMO) | Source: Ambulatory Visit | Attending: Internal Medicine | Admitting: Internal Medicine

## 2015-08-01 VITALS — BP 118/62 | HR 92 | Wt 146.0 lb

## 2015-08-01 DIAGNOSIS — I471 Supraventricular tachycardia: Secondary | ICD-10-CM | POA: Diagnosis not present

## 2015-08-01 DIAGNOSIS — I517 Cardiomegaly: Secondary | ICD-10-CM | POA: Diagnosis not present

## 2015-08-01 DIAGNOSIS — Z79899 Other long term (current) drug therapy: Secondary | ICD-10-CM | POA: Diagnosis not present

## 2015-08-01 DIAGNOSIS — C50411 Malignant neoplasm of upper-outer quadrant of right female breast: Secondary | ICD-10-CM | POA: Diagnosis not present

## 2015-08-01 MED ORDER — CARVEDILOL 3.125 MG PO TABS: 3.1250 mg | ORAL_TABLET | Freq: Two times a day (BID) | ORAL | Status: DC

## 2015-08-01 NOTE — Progress Notes (Signed)
Patient ID: [name], female   DOB: [birth year], 60 y.o.   MRN: [id number]   ADVANCED HF CLINIC CONSULT NOTE  Referring Physician: Magrinat Primary Care: Theadore Nan Primary Cardiologist:  HPI:  Catherine Lawson is a 60 y.o. woman with no previous significant medical history who was diagnosed with  right breast cancer 02/02/2015 for a clinically multifocal T1c NX, stage IA invasive ductal carcinoma, grade 2 or 3, estrogen and progesterone receptor positive, with HER-2 not amplified and the MIB-1 at 40%. She is referred for enrollment into the cardio-oncology clinic for surveillance while getting Herceptin.   Breast CA history as follows:   (1) biopsy of a suspicious left breast mass 03/28/2015 showed only usual ductal hyperplasia, no malignancy identified.  (2) genetics testing 03/06/2015 through the Breast/Ovarian gene panel offered by [facility] found no deleterious mutations in ATM, BARD1, BRCA1, BRCA2, BRIP1, CDH1, CHEK2, EPCAM, FANCC, MLH1, MSH2, MSH6, NBN, PALB2, PMS2, PTEN, RAD51C, RAD51D, TP53, and XRCC2. (a) there was a Variant of Unknown Significance in the BRCA2 gene called c.6613G>A  (3) status post right lumpectomy and sentinel lymph node sampling 05/29/2015 for an mpT1b pN0, stage IA invasive ductal carcinoma, grade 2, with positive margins (a) margins cleared with subsequent surgery 06/12/2015  (4) adjuvant chemotherapy to consist of cyclophosphamide and docetaxel 4, given 3 weeks apart, starting 06/27/2015.  (5) FISH panel returned HER-2 positive. Will begin trastuzumab every 3 weeks through 1 year, starting 08/08/14.  (a) echocardiogram on 07/11/15 showed an EF of 60-65%  (6) adjuvant radiation to follow chemotherapy  (7) received anastrozole 02/08/2015 through December 2016, to resume at the completion of radiation  She is doing well. Denies SOB or edema. Has h/o tachypalpitations and was evaluated 20 years ago with stress test, echo and  heart monitor. Did not have any episodes while wearing monitor. Last episode was 12/26 had HR into 190s. Lasted 3 hours. Mild dizziness. Can often break with valsalva. Tends to have them 1x/month with occasional shorter episodes.  No edema. Cut back on coffee without much benefit. Feels that stress is a trigger. Has been through 2 rounds of chemo but has not started Herceptin yet. Had significant bone pain from Neulasta. Otherwise tolerating chemo ok.   Echo 07/11/15: EF 60-65% LATERAL S' 12.8 gls -19.0     Review of Systems: [y] = yes, _0  = no   General: Weight gain _1 ; Weight loss _2 ; Anorexia _3 ; Fatigue _4 ; Fever _5 ; Chills _6 ; Weakness _7   Cardiac: Chest pain/pressure _8 ; Resting SOB _9 ; Exertional SOB _10 ; Orthopnea _11 ; Pedal Edema _12 ; Palpitations Blue.Reese ]; Syncope _13 ; Presyncope _14 ; Paroxysmal nocturnal dyspnea_15   Pulmonary: Cough _16 ; Wheezing_17 ; Hemoptysis_18 ; Sputum _19 ; Snoring _20   GI: Vomiting_21 ; Dysphagia_22 ; Melena_23 ; Hematochezia _24 ; Heartburn_25 ; Abdominal pain _26 ; Constipation _27 ; Diarrhea _28 ; BRBPR _29   GU: Hematuria_30 ; Dysuria _31 ; Nocturia_32   Vascular: Pain in legs with walking _33 ; Pain in feet with lying flat _34 ; Non-healing sores _35 ; Stroke _36 ; TIA _37 ; Slurred speech _38 ;  Neuro: Headaches_39 ; Vertigo_40 ; Seizures_41 ; Paresthesias_42 ;Blurred vision _43 ; Diplopia _44 ; Vision changes _45   Ortho/Skin: Arthritis _46 ; Joint pain _47 ; Muscle pain _48 ; Joint swelling _49 ; Back Pain _50 ; Rash _51   Psych: Depression_52 ; Anxiety_53   Heme: Bleeding problems _54 ;  Clotting disorders _0 ; Anemia _1   Endocrine: Diabetes _2 ; Thyroid dysfunction_3    Past Medical History  Diagnosis Date  . Breast cancer of upper-outer quadrant of right female breast ([place]) 02/06/2015  . Breast cancer ([place])   . Family history of breast cancer   . Family history of colon cancer   . Family history of ovarian cancer   . Family history of stomach cancer   . Arthritis     bil  thumb joints  . Dysrhythmia     fast HR at times due to stress, no chest pain or SOB    Current Outpatient Prescriptions  Medication Sig Dispense Refill  . dexamethasone (DECADRON) 4 MG tablet Take 1 tablet (4 mg total) by mouth 2 (two) times daily. Start the day before Taxotere. Then again the day after chemo for 3 days. 30 tablet 1  . halobetasol (ULTRAVATE) 0.05 % ointment Apply 1 application topically daily as needed (eczema). Reported on 07/26/2015    . ibuprofen (ADVIL,MOTRIN) 200 MG tablet Take 200 mg by mouth. Pt takes med days 5 - 7  after chemo; 2 tablets every 6-8 hours as needed    . lidocaine-prilocaine (EMLA) cream Apply to affected area once 30 g 3  . loratadine (CLARITIN) 10 MG tablet Take 10 mg by mouth daily.    Marland Kitchen LORazepam (ATIVAN) 0.5 MG tablet Take 1 tablet (0.5 mg total) by mouth at bedtime as needed (Nausea or vomiting). 30 tablet 0  . ondansetron (ZOFRAN) 8 MG tablet Take 1 tablet (8 mg total) by mouth 2 (two) times daily. Start the day after chemo for 3 days. Then take as needed for nausea or vomiting. 30 tablet 1  . prochlorperazine (COMPAZINE) 10 MG tablet Take 1 tablet (10 mg total) by mouth every 6 (six) hours as needed (Nausea or vomiting). 30 tablet 1  . tobramycin-dexamethasone (TOBRADEX) ophthalmic solution Place 1 drop into both eyes 2 (two) times daily. 5 mL 0   No current facility-administered medications for this encounter.    No Known Allergies    Social History   Social History  . Marital Status: Married    Spouse Name: Event organiser  . Number of Children: 0  . Years of Education: N/A   Occupational History  . Not on file.   Social History Main Topics  . Smoking status: Never Smoker   . Smokeless tobacco: Not on file  . Alcohol Use: Yes     Comment: social  . Drug Use: No  . Sexual Activity: Yes   Other Topics Concern  . Not on file   Social History Narrative      Family History  Problem Relation Age of Onset  . Breast cancer Sister 33    . Colon cancer Father 62  . Cancer Maternal Grandmother     probable ovarian cancer  . Stomach cancer Paternal Grandmother 47  . Brain cancer Other 10    Medulloblastoma  . Parkinson's disease Mother   . Lung cancer Paternal Uncle     smoker  . Stomach cancer Other     PGMs sister  . Stomach cancer Other     PMGs brother    Filed Vitals:   08/01/15 1220  BP: 118/62  Pulse: 92  Weight: 146 lb (66.225 kg)  SpO2: 98%    PHYSICAL EXAM: General:  Well appearing. No respiratory difficulty HEENT: normal Neck: supple. no JVD. Carotids 2+ bilat; no bruits. No lymphadenopathy or thryomegaly appreciated. Cor: PMI  nondisplaced. Regular rate & rhythm. No rubs, gallops or murmurs. Lungs: clear Abdomen: soft, nontender, nondistended. No hepatosplenomegaly. No bruits or masses. Good bowel sounds. Extremities: no cyanosis, clubbing, rash, edema Neuro: alert & oriented x 3, cranial nerves grossly intact. moves all 4 extremities w/o difficulty. Affect pleasant.  ECG: NSR 72. LAE No ST-T wave abnormalities. No delta wave   ASSESSMENT & PLAN: 1. Breast cancer    -Explained incidence of Herceptin cardiotoxicity and role of Cardio-oncology clinic at length. Echo images reviewed personally. All parameters stable. Reviewed signs and symptoms of HF to look for. Ok to start Herceptin. Follow-up with echo in 3 months. 2. Tachypalpitations    --likely PSVT (benign but aggravating)    --start carvedilol 3.125 bid    --30-day event monitor. Also discussed AliveCor device.    --discussed vagal maneuvers as well as role of ablation if needed  Bensimhon, Daniel,MD 5:28 PM

## 2015-08-01 NOTE — Patient Instructions (Signed)
Start Carvedilol 3.125 mg Twice daily   Your physician has recommended that you wear an event monitor. Event monitors are medical devices that record the heart's electrical activity. Doctors most often [place] these monitors to diagnose arrhythmias. Arrhythmias are problems with the speed or rhythm of the heartbeat. The monitor is a small, portable device. You can wear one while you do your normal daily activities. This is usually used to diagnose what is causing palpitations/syncope (passing out).  We will contact you in 3 months to schedule your next appointment and echocardiogram

## 2015-08-08 ENCOUNTER — Ambulatory Visit (INDEPENDENT_AMBULATORY_CARE_PROVIDER_SITE_OTHER): Payer: Managed Care, Other (non HMO)

## 2015-08-08 DIAGNOSIS — I471 Supraventricular tachycardia: Secondary | ICD-10-CM | POA: Diagnosis not present

## 2015-08-09 ENCOUNTER — Other Ambulatory Visit (HOSPITAL_BASED_OUTPATIENT_CLINIC_OR_DEPARTMENT_OTHER): Payer: Managed Care, Other (non HMO)

## 2015-08-09 ENCOUNTER — Other Ambulatory Visit: Payer: Managed Care, Other (non HMO)

## 2015-08-09 ENCOUNTER — Other Ambulatory Visit: Payer: Self-pay | Admitting: Oncology

## 2015-08-09 ENCOUNTER — Telehealth: Payer: Self-pay | Admitting: Oncology

## 2015-08-09 ENCOUNTER — Ambulatory Visit: Payer: Managed Care, Other (non HMO)

## 2015-08-09 ENCOUNTER — Ambulatory Visit (HOSPITAL_BASED_OUTPATIENT_CLINIC_OR_DEPARTMENT_OTHER): Payer: Managed Care, Other (non HMO)

## 2015-08-09 ENCOUNTER — Ambulatory Visit (HOSPITAL_BASED_OUTPATIENT_CLINIC_OR_DEPARTMENT_OTHER): Payer: Managed Care, Other (non HMO) | Admitting: Nurse Practitioner

## 2015-08-09 ENCOUNTER — Encounter: Payer: Self-pay | Admitting: *Deleted

## 2015-08-09 ENCOUNTER — Encounter: Payer: Self-pay | Admitting: Nurse Practitioner

## 2015-08-09 VITALS — BP 116/52 | HR 78 | Temp 98.3°F | Resp 18 | Ht 64.0 in | Wt 144.0 lb

## 2015-08-09 DIAGNOSIS — Z5189 Encounter for other specified aftercare: Secondary | ICD-10-CM

## 2015-08-09 DIAGNOSIS — Z95828 Presence of other vascular implants and grafts: Secondary | ICD-10-CM

## 2015-08-09 DIAGNOSIS — C50411 Malignant neoplasm of upper-outer quadrant of right female breast: Secondary | ICD-10-CM

## 2015-08-09 DIAGNOSIS — Z5111 Encounter for antineoplastic chemotherapy: Secondary | ICD-10-CM | POA: Diagnosis not present

## 2015-08-09 DIAGNOSIS — Z170 Estrogen receptor positive status [ER+]: Secondary | ICD-10-CM | POA: Diagnosis not present

## 2015-08-09 DIAGNOSIS — Z5112 Encounter for antineoplastic immunotherapy: Secondary | ICD-10-CM | POA: Diagnosis not present

## 2015-08-09 LAB — COMPREHENSIVE METABOLIC PANEL
ALBUMIN: 3.8 g/dL (ref 3.5–5.0)
ALK PHOS: 101 U/L (ref 40–150)
ALT: 22 U/L (ref 0–55)
ANION GAP: 7 meq/L (ref 3–11)
AST: 15 U/L (ref 5–34)
BILIRUBIN TOTAL: 0.48 mg/dL (ref 0.20–1.20)
BUN: 15.5 mg/dL (ref 7.0–26.0)
CO2: 24 meq/L (ref 22–29)
CREATININE: 0.8 mg/dL (ref 0.6–1.1)
Calcium: 8.8 mg/dL (ref 8.4–10.4)
Chloride: 111 mEq/L — ABNORMAL HIGH (ref 98–109)
EGFR: 85 mL/min/{1.73_m2} — AB (ref >90)
Glucose: 103 mg/dl (ref 70–140)
Potassium: 4.1 mEq/L (ref 3.5–5.1)
Sodium: 141 mEq/L (ref 136–145)
TOTAL PROTEIN: 6.5 g/dL (ref 6.4–8.3)

## 2015-08-09 LAB — CBC WITH DIFFERENTIAL/PLATELET
BASO%: 0.2 % (ref 0.0–2.0)
Basophils Absolute: 0 10*3/uL (ref 0.0–0.1)
EOS ABS: 0 10*3/uL (ref 0.0–0.5)
EOS%: 0 % (ref 0.0–7.0)
HEMATOCRIT: 36.6 % (ref 34.8–46.6)
HEMOGLOBIN: 12.5 g/dL (ref 11.6–15.9)
LYMPH#: 1.4 10*3/uL (ref 0.9–3.3)
LYMPH%: 16.3 % (ref 14.0–49.7)
MCH: 30.9 pg (ref 25.1–34.0)
MCHC: 34.2 g/dL (ref 31.5–36.0)
MCV: 90.4 fL (ref 79.5–101.0)
MONO#: 0.6 10*3/uL (ref 0.1–0.9)
MONO%: 7.1 % (ref 0.0–14.0)
NEUT%: 76.4 % (ref 38.4–76.8)
NEUTROS ABS: 6.8 10*3/uL — AB (ref 1.5–6.5)
PLATELETS: 339 10*3/uL (ref 145–400)
RBC: 4.05 10*6/uL (ref 3.70–5.45)
RDW: 14.3 % (ref 11.2–14.5)
WBC: 8.9 10*3/uL (ref 3.9–10.3)

## 2015-08-09 MED ORDER — DIPHENHYDRAMINE HCL 25 MG PO CAPS
ORAL_CAPSULE | ORAL | Status: AC
  Filled 2015-08-09: qty 2

## 2015-08-09 MED ORDER — SODIUM CHLORIDE 0.9 % IJ SOLN
10.0000 mL | INTRAMUSCULAR | Status: DC | PRN
  Administered 2015-08-09: 10 mL
  Filled 2015-08-09: qty 10

## 2015-08-09 MED ORDER — DOCETAXEL CHEMO INJECTION 160 MG/16ML
75.0000 mg/m2 | Freq: Once | INTRAVENOUS | Status: AC
  Administered 2015-08-09: 130 mg via INTRAVENOUS
  Filled 2015-08-09: qty 13

## 2015-08-09 MED ORDER — SODIUM CHLORIDE 0.9 % IV SOLN
Freq: Once | INTRAVENOUS | Status: AC
  Administered 2015-08-09: 10:00:00 via INTRAVENOUS
  Filled 2015-08-09: qty 8

## 2015-08-09 MED ORDER — HEPARIN SOD (PORK) LOCK FLUSH 100 UNIT/ML IV SOLN
500.0000 [IU] | Freq: Once | INTRAVENOUS | Status: AC | PRN
  Administered 2015-08-09: 500 [IU]
  Filled 2015-08-09: qty 5

## 2015-08-09 MED ORDER — ACETAMINOPHEN 325 MG PO TABS
ORAL_TABLET | ORAL | Status: AC
  Filled 2015-08-09: qty 2

## 2015-08-09 MED ORDER — SODIUM CHLORIDE 0.9% FLUSH
10.0000 mL | INTRAVENOUS | Status: DC | PRN
  Administered 2015-08-09: 10 mL via INTRAVENOUS
  Filled 2015-08-09: qty 10

## 2015-08-09 MED ORDER — SODIUM CHLORIDE 0.9 % IV SOLN
600.0000 mg/m2 | Freq: Once | INTRAVENOUS | Status: AC
  Administered 2015-08-09: 1040 mg via INTRAVENOUS
  Filled 2015-08-09: qty 52

## 2015-08-09 MED ORDER — PEGFILGRASTIM 6 MG/0.6ML ~~LOC~~ PSKT
6.0000 mg | PREFILLED_SYRINGE | Freq: Once | SUBCUTANEOUS | Status: AC
  Administered 2015-08-09: 6 mg via SUBCUTANEOUS
  Filled 2015-08-09: qty 0.6

## 2015-08-09 MED ORDER — SODIUM CHLORIDE 0.9 % IV SOLN
Freq: Once | INTRAVENOUS | Status: AC
  Administered 2015-08-09: 10:00:00 via INTRAVENOUS

## 2015-08-09 MED ORDER — ACETAMINOPHEN 325 MG PO TABS
650.0000 mg | ORAL_TABLET | Freq: Once | ORAL | Status: AC
  Administered 2015-08-09: 650 mg via ORAL

## 2015-08-09 MED ORDER — DIPHENHYDRAMINE HCL 25 MG PO CAPS
25.0000 mg | ORAL_CAPSULE | Freq: Once | ORAL | Status: AC
  Administered 2015-08-09: 25 mg via ORAL

## 2015-08-09 MED ORDER — TRASTUZUMAB CHEMO INJECTION 440 MG
8.0000 mg/kg | Freq: Once | INTRAVENOUS | Status: AC
  Administered 2015-08-09: 546 mg via INTRAVENOUS
  Filled 2015-08-09: qty 26

## 2015-08-09 NOTE — Telephone Encounter (Signed)
Appointments made and avs printed for patient °

## 2015-08-09 NOTE — Patient Instructions (Signed)
[facility] Discharge Instructions for Patients Receiving Chemotherapy  Today you received the following chemotherapy agents Herceptin, Cytoxan, Taxotere  To help prevent nausea and vomiting after your treatment, we encourage you to take your nausea medication    If you develop nausea and vomiting that is not controlled by your nausea medication, call the clinic.   BELOW ARE SYMPTOMS THAT SHOULD BE REPORTED IMMEDIATELY:  *FEVER GREATER THAN 100.5 F  *CHILLS WITH OR WITHOUT FEVER  NAUSEA AND VOMITING THAT IS NOT CONTROLLED WITH YOUR NAUSEA MEDICATION  *UNUSUAL SHORTNESS OF BREATH  *UNUSUAL BRUISING OR BLEEDING  TENDERNESS IN MOUTH AND THROAT WITH OR WITHOUT PRESENCE OF ULCERS  *URINARY PROBLEMS  *BOWEL PROBLEMS  UNUSUAL RASH Items with * indicate a potential emergency and should be followed up as soon as possible.  Feel free to call the clinic you have any questions or concerns. The clinic phone number is [phone].  Please show the [facility] at check-in to the Emergency Department and triage nurse.  Trastuzumab injection for infusion What is this medicine? TRASTUZUMAB (tras TOO zoo mab) is a monoclonal antibody. It is used to treat breast cancer and stomach cancer. This medicine may be used for other purposes; ask your health care provider or pharmacist if you have questions. What should I tell my health care provider before I take this medicine? They need to know if you have any of these conditions: -heart disease -heart failure -infection (especially a virus infection such as chickenpox, cold sores, or herpes) -lung or breathing disease, like asthma -recent or ongoing radiation therapy -an unusual or allergic reaction to trastuzumab, benzyl alcohol, or other medications, foods, dyes, or preservatives -pregnant or trying to get pregnant -breast-feeding How should I use this medicine? This drug is given as an infusion into a vein. It is  administered in a hospital or clinic by a specially trained health care professional. Talk to your pediatrician regarding the use of this medicine in children. This medicine is not approved for use in children. Overdosage: If you think you have taken too much of this medicine contact a poison control center or emergency room at once. NOTE: This medicine is only for you. Do not share this medicine with others. What if I miss a dose? It is important not to miss a dose. Call your doctor or health care professional if you are unable to keep an appointment. What may interact with this medicine? -doxorubicin -warfarin This list may not describe all possible interactions. Give your health care provider a list of all the medicines, herbs, non-prescription drugs, or dietary supplements you use. Also tell them if you smoke, drink alcohol, or use illegal drugs. Some items may interact with your medicine. What should I watch for while using this medicine? Visit your doctor for checks on your progress. Report any side effects. Continue your course of treatment even though you feel ill unless your doctor tells you to stop. Call your doctor or health care professional for advice if you get a fever, chills or sore throat, or other symptoms of a cold or flu. Do not treat yourself. Try to avoid being around people who are sick. You may experience fever, chills and shaking during your first infusion. These effects are usually mild and can be treated with other medicines. Report any side effects during the infusion to your health care professional. Fever and chills usually do not happen with later infusions. Do not become pregnant while taking this medicine or  for 7 months after stopping it. Women should inform their doctor if they wish to become pregnant or think they might be pregnant. Women of child-bearing potential will need to have a negative pregnancy test before starting this medicine. There is a potential for  serious side effects to an unborn child. Talk to your health care professional or pharmacist for more information. Do not breast-feed an infant while taking this medicine or for 7 months after stopping it. Women must use effective birth control with this medicine. What side effects may I notice from receiving this medicine? Side effects that you should report to your doctor or other health care professional as soon as possible: -breathing difficulties -chest pain or palpitations -cough -dizziness or fainting -fever or chills, sore throat -skin rash, itching or hives -swelling of the legs or ankles -unusually weak or tired Side effects that usually do not require medical attention (report to your doctor or other health care professional if they continue or are bothersome): -loss of appetite -headache -muscle aches -nausea This list may not describe all possible side effects. Call your doctor for medical advice about side effects. You may report side effects to FDA at 1-800-FDA-1088. Where should I keep my medicine? This drug is given in a hospital or clinic and will not be stored at home. NOTE: This sheet is a summary. It may not cover all possible information. If you have questions about this medicine, talk to your doctor, pharmacist, or health care provider.    2016, Elsevier/Gold Standard. (2014-10-07 11:49:32)

## 2015-08-09 NOTE — Patient Instructions (Signed)

## 2015-08-09 NOTE — Progress Notes (Signed)
[facility]  [phone] [phone]) [phone]   ID: KARENE BRACKEN DOB: [birth year]  MR#: [id number]  TDD#:[id number]  Patient Care Team: Cari Caraway, MD as PCP - General (Family Medicine) Excell Seltzer, MD as Consulting Physician (General Surgery) Chauncey Cruel, MD as Consulting Physician (Oncology) Kyung Rudd, MD as Consulting Physician (Radiation Oncology) Mauro Kaufmann, RN as Registered Nurse Rockwell Germany, RN as Registered Nurse Sylvan Cheese, NP as Nurse Practitioner (Nurse Practitioner) Vania Rea, MD as Consulting Physician (Obstetrics and Gynecology) PCP: Cari Caraway, MD OTHER MD:  CHIEF COMPLAINT: Estrogen receptor positive breast cancer  CURRENT TREATMENT: Adjuvant chemotherapy  BREAST CANCER HISTORY: From the original intake note:     "[name]" had routine screening mammography at [facility] 01/24/2015. This showed a 1.6 cm mass in the right breast at the 11:00 position, and on 0.8 cm irregular density also at the 11:00 position farther away from the nipple. On 02/01/2015 she underwent a unilateral right diagnostic mammography at [facility]. The breast density was category B. In addition to the 2 masses previously noted there was a cluster of calcifications at the 9:00 position. Ultrasonography the same day showed a 1.9 cm oval mass, which was hypoechoic with no vascularity and hard on L a Stogner if he, a 7 mm mass at the 10:00 position with no vascularity and intermediate L a Stogner 3, and a lymph node with uniform cortex thickening in the right axilla. The 2 masses in question were separated by 2 cm.  On 02/02/2015 the patient underwent biopsy of what appears to have been the larger of the 2 breast masses (I cannot locate the biopsy report). This documented invasive ductal carcinoma, grade 2 or 3, with micropapillary features, estrogen and progesterone receptor positive, with an MIB-1 of 40%, and HER-2 not amplified with a signals ratio  of 1.36 and a number per cell of 3.60. There was evidence of lymphovascular invasion.  Because of bleeding from the first biopsy, the second breast mass could not be performed. The lymph node was aspirated, not cord, and this appeared benign but the concordance is questionable.  The patient's subsequent history is as detailed below  INTERVAL HISTORY: [name] returns today for follow-up of her breast cancer, alone. Today is day 1, cycle 3 of cyclophosphamide and docetaxel given every 21 days, with neulasta onpro given on day 2 for granulocyte support. She will also begin trastuzumab (given every 3 weeks) today. She was cleared by Dr. Haroldine Laws who saw no obvious issues with her heart. She was given an AliveCor device that she is to wear for the next 30 days to track any tachycardic episodes and palpitations. She was also started on coreg 3.169m daily. She will have a repeat echocardiogram performed in 3 months.  REVIEW OF SYSTEMS:  PChong Sicilianhas no complaints today. She worked out twice ([employer] She stays hydrated and is eating well with no nausea. She is moving her bowel well. She denies mouth sores, rashes, or neuropathy symptoms. She is in no pain. A detailed review of systems is otherwise stable.   PAST MEDICAL HISTORY: Past Medical History  Diagnosis Date  . Breast cancer of upper-outer quadrant of right female breast ([facility] 02/06/2015  . Breast cancer ([facility]   . Family history of breast cancer   . Family history of colon cancer   . Family history of ovarian cancer   . Family history of stomach cancer   . Arthritis     bil thumb joints  .  Dysrhythmia     fast HR at times due to stress, no chest pain or SOB    PAST SURGICAL HISTORY: Past Surgical History  Procedure Laterality Date  . Wisdom tooth extraction    . Radioactive seed guided mastectomy with axillary sentinel lymph node biopsy Right 05/29/2015    Procedure: RADIOACTIVE SEED RIGHT BREAST LUMPECTOMY WITH AXILLARY SENTINEL  LYMPH NODE BIOPSY;  Surgeon: Excell Seltzer, MD;  Location: [facility];  Service: General;  Laterality: Right;  . Re-excision of breast lumpectomy Right 06/12/2015    Procedure: RE-EXCISION OF RIGHT  BREAST LUMPECTOMY;  Surgeon: Excell Seltzer, MD;  Location: [facility];  Service: General;  Laterality: Right;  . Portacath placement Right 06/27/2015    Procedure: INSERTION PORT-A-CATH;  Surgeon: Excell Seltzer, MD;  Location: [facility];  Service: General;  Laterality: Right;    FAMILY HISTORY Family History  Problem Relation Age of Onset  . Breast cancer Sister 60  . Colon cancer Father 12  . Cancer Maternal Grandmother     probable ovarian cancer  . Stomach cancer Paternal Grandmother 46  . Brain cancer Other 10    Medulloblastoma  . Parkinson's disease Mother   . Lung cancer Paternal Uncle     smoker  . Stomach cancer Other     PGMs sister  . Stomach cancer Other     PMGs brother   the patient's father is still living, at age 17. The patient's mother died from Parkinson's disease complications at age 21. The patient had 5 brothers, 4 sisters. One sister was diagnosed with breast cancer at age 53 and died at age 39. The patient's father was diagnosed with colon cancer at the age of 1. A nephew was diagnosed with medulloblastoma at age 33 and died at age 52. The paternal grandmother was diagnosed with stomach cancer at age 26. The maternal grandmother was diagnosed with metastatic cancer of unknown type at age 56. (The description is suggestive of an ovarian cancer).  GYNECOLOGIC HISTORY:  No LMP recorded. Patient is postmenopausal. Menarche age 28. The patient is GX P0. She stopped having periods in 2014. She did not take hormone replacement. She took oral contraceptives for approximately 20 years with no complications.  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 Her husband [name] works for a Merchandiser, retail in [employer] "[employer] credited Warehouse manager". [name] has 2 daughters from an earlier marriage, Ronalee Belts lives in [place] and works in recruiting, and Larraine Argo lives in [place] and works in Press photographer. They have 1 grandchild. The patient attends the [employer]   HEALTH MAINTENANCE: Social History  Substance Use Topics  . Smoking status: Never Smoker   . Smokeless tobacco: Not on file  . Alcohol Use: Yes     Comment: social     Colonoscopy: 2014/lobe are  PAP: 2015  Bone density: Remote  Lipid panel:  No Known Allergies  Current Outpatient Prescriptions  Medication Sig Dispense Refill  . carvedilol (COREG) 3.125 MG tablet Take 1 tablet (3.125 mg total) by mouth 2 (two) times daily. 60 tablet 4  . dexamethasone (DECADRON) 4 MG tablet Take 1 tablet (4 mg total) by mouth 2 (two) times daily. Start the day before Taxotere. Then again the day after chemo for 3 days. 30 tablet 1  . halobetasol (ULTRAVATE) 0.05 % ointment Apply 1 application topically daily as needed (eczema).  Reported on 07/26/2015    . ibuprofen (ADVIL,MOTRIN) 200 MG tablet Take 200 mg by mouth. Pt takes med days 5 - 7  after chemo; 2 tablets every 6-8 hours as needed    . lidocaine-prilocaine (EMLA) cream Apply to affected area once 30 g 3  . loratadine (CLARITIN) 10 MG tablet Take 10 mg by mouth daily.    Marland Kitchen LORazepam (ATIVAN) 0.5 MG tablet Take 1 tablet (0.5 mg total) by mouth at bedtime as needed (Nausea or vomiting). 30 tablet 0  . ondansetron (ZOFRAN) 8 MG tablet Take 1 tablet (8 mg total) by mouth 2 (two) times daily. Start the day after chemo for 3 days. Then take as needed for nausea or vomiting. 30 tablet 1  . prochlorperazine (COMPAZINE) 10 MG tablet Take 1 tablet (10 mg total) by mouth every 6 (six) hours as needed (Nausea or vomiting). 30 tablet 1  . tobramycin-dexamethasone (TOBRADEX) ophthalmic solution Place 1 drop into both eyes 2 (two) times daily.  5 mL 0   No current facility-administered medications for this visit.    OBJECTIVE: Middle-aged white woman who appears well Filed Vitals:   08/09/15 0824  BP: 116/52  Pulse: 78  Temp: 98.3 F (36.8 C)  Resp: 18     Body mass index is 24.71 kg/(m^2).    ECOG FS:0 - Asymptomatic  Skin: warm, dry  HEENT: sclerae anicteric, conjunctivae pink, oropharynx clear. No thrush or mucositis.  Lymph Nodes: No cervical or supraclavicular lymphadenopathy  Lungs: clear to auscultation bilaterally, no rales, wheezes, or rhonci  Heart: regular rate and rhythm  Abdomen: round, soft, non tender, positive bowel sounds  Musculoskeletal: No focal spinal tenderness, no peripheral edema  Neuro: non focal, well oriented, positive affect  Breasts: deferred  LAB RESULTS:  CMP     Component Value Date/Time   NA 141 08/09/2015 0752   K 4.1 08/09/2015 0752   CO2 24 08/09/2015 0752   GLUCOSE 103 08/09/2015 0752   BUN 15.5 08/09/2015 0752   CREATININE 0.8 08/09/2015 0752   CALCIUM 8.8 08/09/2015 0752   PROT 6.5 08/09/2015 0752   ALBUMIN 3.8 08/09/2015 0752   AST 15 08/09/2015 0752   ALT 22 08/09/2015 0752   ALKPHOS 101 08/09/2015 0752   BILITOT 0.48 08/09/2015 0752    INo results found for: SPEP, UPEP  Lab Results  Component Value Date   WBC 8.9 08/09/2015   NEUTROABS 6.8* 08/09/2015   HGB 12.5 08/09/2015   HCT 36.6 08/09/2015   MCV 90.4 08/09/2015   PLT 339 08/09/2015      Chemistry      Component Value Date/Time   NA 141 08/09/2015 0752   K 4.1 08/09/2015 0752   CO2 24 08/09/2015 0752   BUN 15.5 08/09/2015 0752   CREATININE 0.8 08/09/2015 0752      Component Value Date/Time   CALCIUM 8.8 08/09/2015 0752   ALKPHOS 101 08/09/2015 0752   AST 15 08/09/2015 0752   ALT 22 08/09/2015 0752   BILITOT 0.48 08/09/2015 0752       No results found for: LABCA2  No components found for: LABCA125  No results for input(s): INR in the last 168 hours.  Urinalysis No results found  for: COLORURINE, APPEARANCEUR, LABSPEC, PHURINE, GLUCOSEU, HGBUR, BILIRUBINUR, KETONESUR, PROTEINUR, UROBILINOGEN, NITRITE, LEUKOCYTESUR  STUDIES: No results found.  ASSESSMENT: 60 y.o. [place] woman status post right breast biopsy 02/02/2015 for a clinically multifocal T1c NX, stage IA invasive ductal carcinoma, grade 2 or 3, estrogen and  progesterone receptor positive, with HER-2 not amplified and the MIB-1 at 40%  (1) biopsy of a suspicious left breast mass 03/28/2015 showed only usual ductal hyperplasia, no malignancy identified.  (2) genetics testing 03/06/2015 through the Breast/Ovarian gene panel offered by [facility] found no deleterious mutations in ATM, BARD1, BRCA1, BRCA2, BRIP1, CDH1, CHEK2, EPCAM, FANCC, MLH1, MSH2, MSH6, NBN, PALB2, PMS2, PTEN, RAD51C, RAD51D, TP53, and XRCC2.  (a) there was a Variant of Unknown Significance in the BRCA2 gene called [id number]  (3) status post right lumpectomy and sentinel lymph node sampling 05/29/2015 for an mpT1b pN0, stage IA invasive ductal carcinoma, grade 2, with positive margins  (a) margins cleared with subsequent surgery 06/12/2015  (3) Oncotype score of 35 predicts a risk of outside the breast recurrence within 10 years of 24% if the patient's only systemic therapy is tamoxifen for 5 years. It also predicts significant benefit from chemotherapy.  (4) adjuvant chemotherapy to consist of cyclophosphamide and docetaxel 4, given 3 weeks apart, starting 06/27/2015.  (5) FISH panel returned HER-2 positive. Will begin trastuzumab every 3 weeks through 1 year, starting 08/08/14.   (a) echocardiogram on 07/11/15 showed an EF of 60-65%  (6) adjuvant radiation to follow chemotherapy  (7) received anastrozole 02/08/2015 through December 2016, to resume at the completion of radiation  PLAN: [name] looks and feels well today. We briefly reviewed trastuzumab and the potential side effects of this drug. She is being well monitored by Dr.  Clayborne Dana office. The labs were reviewed in detail ane were stable. She will proceed with cycle 3 of cyclophosphamide and docetaxel and her first cycle of trastuzumab as planned.   [name] will return in 1 week for labs and a nadir visit. She understands and agrees with this plan. She knows the goal of treatment in her case is cure. She has been encouraged to call with any issues that might arise before her next visit here.   Laurie Panda, NP 08/09/2015

## 2015-08-10 NOTE — Progress Notes (Addendum)
Location of Breast Cancer: Right Breast  1100 position Upper Outer position   Histology per Pathology Report: Initial dx 02/02/15   Receptor Status: ER(+), PR (+), Her2-neu (  )  Did patient present with symptoms (if so, please note symptoms) or was this found on screening mammography?: Routine mammogram screening  Past/Anticipated interventions by surgeon, if HWT:[id number] 06/12/15: 1. Breast, excision, right tissue posterior BREAST TISSUE WITH REACTIVE GIANT CELLS AND FOAMY HISTOCYTESNEGATIVE FOR ATYPIA OR MALIGNANCY 2. Breast, excision, right breast tissue superior margin BREAST TISSUE WITH REACTIVE GIANT CELLS AND FOAMY HISTOCYTESNEGATIVE FOR ATYPIA OR MALIGNANCY 3. Breast, excision, right breast tissue medial ATYPICAL DUCTAL HYPERPLASIARESECTION MARGINS ARE NEGATIVE FOR ATYPIA OR MALIGNANCY Diagnosis 05/28/16: 1. Breast, lumpectomy, right - MULTIFOCAL INVASIVE DUCTAL CARCINOMA, SEE COMMENT.- INVASIVE TUMOR IS FOCALLY PRESENT AT SUPERIOR, MEDIAL AND POSTERIOR MARGINS.- DUCTAL CARCINOMA IN SITU.- PREVIOUS BIOPSY SITE CHANGE- SEE TUMOR SYNOPTIC TEMPLATE BELOW. 2. Breast, excision, right additional inferior margin - ATYPICAL DUCTAL HYPERPLASIA, SEE COMMENT.- SURGICAL MARGIN, NEGATIVE FOR ATYPIA OR MALIGNANCY. 3. Lymph node, sentinel, biopsy, right axillary #1 - ONE LYMPH NODE, NEGATIVE FOR TUMOR (0/1) 4. Lymph node, sentinel, biopsy, right axillary #2 - ONE LYMPH NODE, NEGATIVE FOR TUMOR (0/1) 5. Lymph node, sentinel, biopsy, right axillary #3 - ONE LYMPH NODE, NEGATIVE FOR TUMOR (0/1) 6. Lymph node, biopsy, right axillary - ONE LYMPH NODE, NEGATIVE FOR TUMOR (0/1) 7. Lymph node, sentinel, biopsy, right axillary #4 - ONE LYMPH NODE, NEGATIVE FOR TUMOR (0/1) 8. Lymph node, sentinel, biopsy, right axillary #5 - ONE LYMPH NODE, NEGATIVE FOR TUMOR (0/1)   Past/Anticipated interventions by medical oncology, if any: Chemotherapy : Oncotype score 35, Genetic testing, adjuvant chemeoterapy   Cyclophosphamide and docetaxel x 4 given every 3 weeks apart starting 06/27/15, FISH panel returned HER-2 Positive will begin trastuzumab every 3 weeks through 1 years starting 1/25/117,  Received anastrozole 02/08/15-06/2015 to resume after radiation completed  Lymphedema issues, if any:  NONE  Pain issues, if any: NONE  SAFETY ISSUES:  Prior radiation? NO  Pacemaker/ICD? NO  Possible current pregnancy? NO  Is the patient on methotrexate?  NO  Current Complaints / other details:  Menarche age 76, GXPO, no HRT, oral contraceptives  20 years ,non cigarette smoker,no smokeless tobacco, yes to alcohol use, no illicit drug use, Father living age 42, dx colon cancer age  57,,Mother deceased parkinson's age 66, 1 sister with breast cancer, age 63,deceased age 59,  48 dx medulloblastoma age 40,deceased age 22, maternal grandmoher dx metastatic cancer unknown type age 71,Paternal Uncle lung cancer, Paternal grandmother sister  Stomach cancer, Paternal grandmothers brother stomach cancer Rebecca Eaton, RN 08/10/2015,11:25 AM  Allergies: NKA BP 110/53 mmHg  Pulse 95  Temp(Src) 98.3 F (36.8 C) (Oral)  Resp 16  Ht 5' 4"  (1.626 m)  Wt 145 lb 9.6 oz (66.044 kg)  BMI 24.98 kg/m2  SpO2 99%  Wt Readings from Last 3 Encounters:  08/14/15 145 lb 9.6 oz (66.044 kg)  08/09/15 144 lb (65.318 kg)  08/01/15 146 lb (66.225 kg)

## 2015-08-14 ENCOUNTER — Encounter: Payer: Self-pay | Admitting: Radiation Oncology

## 2015-08-14 ENCOUNTER — Ambulatory Visit
Admission: RE | Admit: 2015-08-14 | Discharge: 2015-08-14 | Disposition: A | Discharge status: Home / Self Care | Payer: Managed Care, Other (non HMO) | Source: Ambulatory Visit | Attending: Radiation Oncology | Admitting: Radiation Oncology

## 2015-08-14 VITALS — BP 110/53 | HR 95 | Temp 98.3°F | Resp 16 | Ht 64.0 in | Wt 145.6 lb

## 2015-08-14 DIAGNOSIS — Z170 Estrogen receptor positive status [ER+]: Secondary | ICD-10-CM | POA: Diagnosis not present

## 2015-08-14 DIAGNOSIS — C50411 Malignant neoplasm of upper-outer quadrant of right female breast: Secondary | ICD-10-CM | POA: Diagnosis not present

## 2015-08-14 DIAGNOSIS — Z803 Family history of malignant neoplasm of breast: Secondary | ICD-10-CM | POA: Diagnosis not present

## 2015-08-14 DIAGNOSIS — Z800 Family history of malignant neoplasm of digestive organs: Secondary | ICD-10-CM | POA: Diagnosis not present

## 2015-08-14 NOTE — Progress Notes (Signed)
Radiation Oncology         [phone] ________________________________  Name: Catherine Lawson MRN: [id number]  Date: 08/14/2015  DOB: 09-19-1955  VH:QIONGEX,BMWUX, MD  Excell Seltzer, MD     REFERRING PHYSICIAN: Excell Seltzer, MD   DIAGNOSIS: The encounter diagnosis was Breast cancer of upper-outer quadrant of right female breast ([place]).   HISTORY OF PRESENT ILLNESS:Catherine Lawson is a 60 y.o. female seen at the request of Dr. Jana Hakim for a new diagnosis of right invasive ductal carcinoma. The patient underwent screening mammogram in July 2016 revealing an abnormality in the right breast with a 1.6 cm mass with multiple K calcifications and 2 additional areas of increased density. As ultimately led to a biopsy of the largest lesion on 02/02/2015 revealing invasive ductal carcinoma grade 2-3 with micropapillary features, and lymphovascular invasion identified. ER and PR negative and HER-2/neu negative, Ki-67 was 40%. On ultrasound, the tumors measured 1.9 cm and 7 mm (2 cm apart) and was present in the upper-outer quadrant. Axillary lymph nodes were negative. The patient had a postbiopsy bleeding issues, and did not undergo secondary biopsy but was taken to the operating room on 05/29/2015 where she underwent a right breast lumpectomy with sentinel node mapping. The lumpectomy specimen revealed multifocal invasive ductal carcinoma with tumor focally present in the superior, medial and posterior margins with associated ductal carcinoma in situ. An inferior margin specimen revealed atypical ductal hyperplasia with its margin negative for atypia or malignancy. Of the 5 lymph nodes assessed, none contained any metastatic disease.  Her tumor was noted to be estrogen receptor positive at 10%, 0% for PR , and HER-2/neu positiveShe underwent reexcision on 06/12/2015 revealing atypical atypical ductal hyperplasia in the right medial margin, and no evidence of atypia or malignancy in the right  posterior or superior margins. Oncotype testing revealed a recurrence risk score of 35, and she has gone on to receive adjuvant chemotherapy in the form of Cytoxan and Adriamycin. She began cycle #3 on 08/09/2015 comes today for consideration of radiation to the breast. Also the patient has undergone [facility] testing was negative for deleterious mutations, though there was a BRCA2 mutation of undetermined significance identified. She also has plans to begin Herceptin for 1 year following her chemotherapy.   PREVIOUS RADIATION THERAPY: No   PAST MEDICAL HISTORY:  has a past medical history of Breast cancer of upper-outer quadrant of right female breast ([place]) (02/06/2015); Breast cancer ([place]); Family history of breast cancer; Family history of colon cancer; Family history of ovarian cancer; Family history of stomach cancer; Arthritis; and Dysrhythmia.     PAST SURGICAL HISTORY: Past Surgical History  Procedure Laterality Date  . Wisdom tooth extraction    . Radioactive seed guided mastectomy with axillary sentinel lymph node biopsy Right 05/29/2015    Procedure: RADIOACTIVE SEED RIGHT BREAST LUMPECTOMY WITH AXILLARY SENTINEL LYMPH NODE BIOPSY;  Surgeon: Excell Seltzer, MD;  Location: [facility];  Service: General;  Laterality: Right;  . Re-excision of breast lumpectomy Right 06/12/2015    Procedure: RE-EXCISION OF RIGHT  BREAST LUMPECTOMY;  Surgeon: Excell Seltzer, MD;  Location[facility];  Service: General;  Laterality: Right;  . Portacath placement Right 06/27/2015    Procedure: INSERTION PORT-A-CATH;  Surgeon: Excell Seltzer, MD;  Location: [facility];  Service: General;  Laterality: Right;     FAMILY HISTORY: family history includes Brain cancer (age of onset: 63) in her other; Breast cancer (age of onset: 35) in her sister; Cancer in  her maternal grandmother; Colon cancer (age of onset: 55) in her father; Lung cancer in her paternal uncle; Parkinson's  disease in her mother; Stomach cancer in her other and other; Stomach cancer (age of onset: 41) in her paternal grandmother.   SOCIAL HISTORY:  reports that she has never smoked. She does not have any smokeless tobacco history on file. She reports that she drinks alcohol. She reports that she does not use illicit drugs.   ALLERGIES: Review of patient's allergies indicates no known allergies.   MEDICATIONS:  Current Outpatient Prescriptions  Medication Sig Dispense Refill  . halobetasol (ULTRAVATE) 0.05 % ointment Apply 1 application topically daily as needed (eczema). Reported on 07/26/2015    . lidocaine-prilocaine (EMLA) cream Apply to affected area once 30 g 3  . loratadine (CLARITIN) 10 MG tablet Take 10 mg by mouth daily.    Marland Kitchen LORazepam (ATIVAN) 0.5 MG tablet Take 1 tablet (0.5 mg total) by mouth at bedtime as needed (Nausea or vomiting). 30 tablet 0  . tobramycin-dexamethasone (TOBRADEX) ophthalmic solution Place 1 drop into both eyes 2 (two) times daily. 5 mL 0  . carvedilol (COREG) 3.125 MG tablet Take 1 tablet (3.125 mg total) by mouth 2 (two) times daily. (Patient not taking: Reported on 08/14/2015) 60 tablet 4  . dexamethasone (DECADRON) 4 MG tablet Take 1 tablet (4 mg total) by mouth 2 (two) times daily. Start the day before Taxotere. Then again the day after chemo for 3 days. (Patient not taking: Reported on 08/14/2015) 30 tablet 1  . ibuprofen (ADVIL,MOTRIN) 200 MG tablet Take 200 mg by mouth. Pt takes med days 5 - 7  after chemo; 2 tablets every 6-8 hours as needed    . ondansetron (ZOFRAN) 8 MG tablet Take 1 tablet (8 mg total) by mouth 2 (two) times daily. Start the day after chemo for 3 days. Then take as needed for nausea or vomiting. (Patient not taking: Reported on 08/14/2015) 30 tablet 1  . prochlorperazine (COMPAZINE) 10 MG tablet Take 1 tablet (10 mg total) by mouth every 6 (six) hours as needed (Nausea or vomiting). (Patient not taking: Reported on 08/14/2015) 30 tablet 1    No current facility-administered medications for this encounter.     REVIEW OF SYSTEMS: On review of systems, the patient states  That she is doing quite well overall. She exercises regularly and attributes this to her good energy. She denies any trouble with nausea or vomiting, fevers or chills. She has not been experiencing any difficulty with chest pain but has had several episodes of with sounds to be PVCs. She is wearing a Holter monitor currently for this. She denies any shortness of breath. She is not experiencing any neuropathic pain in finger tips her toes. She has noticed some firm indurated areas underneath her lumpectomy site but denies any pain in her right breast. She is not  experiencing upper extremity edema. A complete review of systems is otherwise negative    PHYSICAL EXAM:  height is _0  (1.626 m) and weight is 145 lb 9.6 oz (66.044 kg). Her oral temperature is 98.3 F (36.8 C). Her blood pressure is 110/53 and her pulse is 95. Her respiration is 16 and oxygen saturation is 99%.   Pain scale 0/10 In general this is a well-appearing Caucasian female in no acute distress. She is alert and oriented 4 in appropriate throughout the examination. Cardiovascular exam reveals a regular rate and rhythm , no clicks rubs or murmurs are auscultated. Chest is  clear to auscultation bilaterally. No palpable adenopathy is noted of the supraclavicular, cervical or axillary regions bilaterally. No lymphedema is identified of the right upper extremity. Breast tissue is assessed bilaterally and postsurgical changes are noted of the right breast consistent with previous lumpectomy and sentinel mapping. Mild induration inferior to the incision is noted. No palpable masses are otherwise appreciated. On the left, no evidence of any palpable abnormalities in either breast, there is no evidence of any nipple discharge or bleeding. Her skin is otherwise intact without excoriation or lesion.  ECOG = 0  0  - Asymptomatic (Fully active, able to carry on all predisease activities without restriction)  1 - Symptomatic but completely ambulatory (Restricted in physically strenuous activity but ambulatory and able to carry out work of a light or sedentary nature. For example, light housework, office work)  2 - Symptomatic, <50% in bed during the day (Ambulatory and capable of all self care but unable to carry out any work activities. Up and about more than 50% of waking hours)  3 - Symptomatic, >50% in bed, but not bedbound (Capable of only limited self-care, confined to bed or chair 50% or more of waking hours)  4 - Bedbound (Completely disabled. Cannot carry on any self-care. Totally confined to bed or chair)  5 - Death   Eustace Pen MM, Creech RH, Tormey DC, et al. [phone]). "Toxicity and response criteria of the [facility] Group". [place] Oncol. 5 (6): 649-55    LABORATORY DATA:  Lab Results  Component Value Date   WBC 8.9 08/09/2015   HGB 12.5 08/09/2015   HCT 36.6 08/09/2015   MCV 90.4 08/09/2015   PLT 339 08/09/2015   Lab Results  Component Value Date   NA 141 08/09/2015   K 4.1 08/09/2015   CO2 24 08/09/2015   Lab Results  Component Value Date   ALT 22 08/09/2015   AST 15 08/09/2015   ALKPHOS 101 08/09/2015   BILITOT 0.48 08/09/2015      RADIOGRAPHY: No results found.     IMPRESSION:  Stage IA, T1c, ductal carcinoma of the right breast   PLAN: Dr. Lisbeth Renshaw spent time discussing with the patient the rationale for proceeding with radiotherapy in patients who undergo breast conservation. He discusses with her the scheme of 33 treatments versus hyperfractionation, and recommend proceeding with 33 fractions over the course of 6-1/2 weeks. We discussed the low likelihood of secondary malignancies. We discussed the possible side effects including but not limited to skin redness, fatigue, permanent skin darkening, and breast swelling. The patient signed a consent form  and this was placed in her medical chart.she is given a appointment for simulation on 09/18/2015 at 2 pm. We will plan to begin radiotherapy treatment the following week. She states agreement and understanding of this. She was given contact information should she have questions or concerns arise to her next visit  The above documentation reflects my direct findings during this shared patient visit. Please see the separate note by Dr. Lisbeth Renshaw on this date for the remainder of the patient's plan of care.  Carola Rhine, PAC

## 2015-08-14 NOTE — Addendum Note (Signed)
Encounter addended by: Kyung Rudd, MD on: 08/14/2015  6:07 PM<BR>     Documentation filed: Follow-up Section

## 2015-08-14 NOTE — Progress Notes (Signed)
Please see the Nurse Progress Note in the MD Initial Consult Encounter for this patient. 

## 2015-08-14 NOTE — Patient Instructions (Signed)
Contact our office if you have any questions following today's appointment: [phone].  

## 2015-08-15 ENCOUNTER — Telehealth: Payer: Self-pay | Admitting: Internal Medicine

## 2015-08-15 ENCOUNTER — Telehealth (HOSPITAL_COMMUNITY): Payer: Self-pay | Admitting: *Deleted

## 2015-08-15 DIAGNOSIS — I471 Supraventricular tachycardia: Secondary | ICD-10-CM

## 2015-08-15 NOTE — Telephone Encounter (Signed)
Received alert from pt's event monitor she is wearing from last night.  Per Dr Haroldine Laws "SVT at 195-200 bpm, refer to EP.  Pt aware and agreeable, she also reports she has not started on the Carvedilol yet, advised she should start, we discussed possible SE, she is agreeable to start.  Referral placed for EP

## 2015-08-15 NOTE — Telephone Encounter (Signed)
Pt is a 59 yo F w/ a PMH significant for breast Ca participating in the cardio-oncology program at [facility] with Dr. Glori Bickers. Recently seen in clinic and reported palpitation episodes occuring monthly concerning for paroxysmal SVT. Started on low-dose and beta-blocker and event monitor ordered. Had episode transmitted around 3 AM 1/31 showing regular narrow complex tachycardia ~160 bpm. Repeat 190 bpm. Pt having palpitations but entirely asymptomatic otherwise. Stable BP. Discussed Vagal maneuvers which have not worked so far. Pt wishes to stay home and wait out episode. Agreed if does not self-terminate should go to closest ER (i.e. [facility]) first thing in AM. She is agreeable and will have husband drive her. Otherwise, will follow-up with Dr. Haroldine Laws whom note was routed to.  Jay Schlichter, MD

## 2015-08-16 ENCOUNTER — Ambulatory Visit (HOSPITAL_BASED_OUTPATIENT_CLINIC_OR_DEPARTMENT_OTHER): Payer: Managed Care, Other (non HMO) | Admitting: Nurse Practitioner

## 2015-08-16 ENCOUNTER — Other Ambulatory Visit (HOSPITAL_BASED_OUTPATIENT_CLINIC_OR_DEPARTMENT_OTHER): Payer: Managed Care, Other (non HMO)

## 2015-08-16 ENCOUNTER — Other Ambulatory Visit: Payer: Managed Care, Other (non HMO)

## 2015-08-16 ENCOUNTER — Encounter: Payer: Self-pay | Admitting: Nurse Practitioner

## 2015-08-16 VITALS — BP 125/66 | HR 108 | Temp 99.2°F | Resp 18 | Ht 64.0 in | Wt 144.6 lb

## 2015-08-16 DIAGNOSIS — E875 Hyperkalemia: Secondary | ICD-10-CM | POA: Diagnosis not present

## 2015-08-16 DIAGNOSIS — C50912 Malignant neoplasm of unspecified site of left female breast: Secondary | ICD-10-CM

## 2015-08-16 DIAGNOSIS — C50411 Malignant neoplasm of upper-outer quadrant of right female breast: Secondary | ICD-10-CM

## 2015-08-16 DIAGNOSIS — D72829 Elevated white blood cell count, unspecified: Secondary | ICD-10-CM | POA: Diagnosis not present

## 2015-08-16 LAB — COMPREHENSIVE METABOLIC PANEL
ALBUMIN: 3.7 g/dL (ref 3.5–5.0)
ALK PHOS: 147 U/L (ref 40–150)
ALT: 46 U/L (ref 0–55)
ANION GAP: 10 meq/L (ref 3–11)
AST: 32 U/L (ref 5–34)
BILIRUBIN TOTAL: 0.58 mg/dL (ref 0.20–1.20)
BUN: 15.1 mg/dL (ref 7.0–26.0)
CO2: 23 mEq/L (ref 22–29)
Calcium: 9.3 mg/dL (ref 8.4–10.4)
Chloride: 105 mEq/L (ref 98–109)
Creatinine: 1 mg/dL (ref 0.6–1.1)
EGFR: 63 mL/min/{1.73_m2} — AB (ref >90)
GLUCOSE: 117 mg/dL (ref 70–140)
POTASSIUM: 5.4 meq/L — AB (ref 3.5–5.1)
SODIUM: 138 meq/L (ref 136–145)
TOTAL PROTEIN: 6.5 g/dL (ref 6.4–8.3)

## 2015-08-16 LAB — CBC WITH DIFFERENTIAL/PLATELET
BASO%: 1.1 % (ref 0.0–2.0)
BASOS ABS: 0.3 10*3/uL — AB (ref 0.0–0.1)
EOS ABS: 0.2 10*3/uL (ref 0.0–0.5)
EOS%: 0.9 % (ref 0.0–7.0)
HCT: 39.2 % (ref 34.8–46.6)
HEMOGLOBIN: 13 g/dL (ref 11.6–15.9)
LYMPH%: 9 % — AB (ref 14.0–49.7)
MCH: 30 pg (ref 25.1–34.0)
MCHC: 33.1 g/dL (ref 31.5–36.0)
MCV: 90.7 fL (ref 79.5–101.0)
MONO#: 1.9 10*3/uL — AB (ref 0.1–0.9)
MONO%: 7.6 % (ref 0.0–14.0)
NEUT#: 20.6 10*3/uL — ABNORMAL HIGH (ref 1.5–6.5)
NEUT%: 81.4 % — ABNORMAL HIGH (ref 38.4–76.8)
PLATELETS: 317 10*3/uL (ref 145–400)
RBC: 4.32 10*6/uL (ref 3.70–5.45)
RDW: 14.5 % (ref 11.2–14.5)
WBC: 25.3 10*3/uL — ABNORMAL HIGH (ref 3.9–10.3)
lymph#: 2.3 10*3/uL (ref 0.9–3.3)

## 2015-08-16 NOTE — Progress Notes (Signed)
[facility]  Telephone:(336) [phone] Fax:(336) [phone]   ID: [name] DOB: [birth year]  MR#: [id number]  PRF#:[id number]  Patient Care Team: Cari Caraway, MD as PCP - General (Family Medicine) Excell Seltzer, MD as Consulting Physician (General Surgery) Chauncey Cruel, MD as Consulting Physician (Oncology) Kyung Rudd, MD as Consulting Physician (Radiation Oncology) Mauro Kaufmann, RN as Registered Nurse Rockwell Germany, RN as Registered Nurse Sylvan Cheese, NP as Nurse Practitioner (Nurse Practitioner) Vania Rea, MD as Consulting Physician (Obstetrics and Gynecology) PCP: Cari Caraway, MD OTHER MD:  CHIEF COMPLAINT: Estrogen receptor positive breast cancer  CURRENT TREATMENT: Adjuvant chemotherapy  BREAST CANCER HISTORY: From the original intake note:     "Catherine Lawson" had routine screening mammography at [facility] 01/24/2015. This showed a 1.6 cm mass in the right breast at the 11:00 position, and on 0.8 cm irregular density also at the 11:00 position farther away from the nipple. On 02/01/2015 she underwent a unilateral right diagnostic mammography at [facility]. The breast density was category B. In addition to the 2 masses previously noted there was a cluster of calcifications at the 9:00 position. Ultrasonography the same day showed a 1.9 cm oval mass, which was hypoechoic with no vascularity and hard on L a Stogner if he, a 7 mm mass at the 10:00 position with no vascularity and intermediate L a Stogner 3, and a lymph node with uniform cortex thickening in the right axilla. The 2 masses in question were separated by 2 cm.  On 02/02/2015 the patient underwent biopsy of what appears to have been the larger of the 2 breast masses (I cannot locate the biopsy report). This documented invasive ductal carcinoma, grade 2 or 3, with micropapillary features, estrogen and progesterone receptor positive, with an MIB-1 of 40%, and HER-2 not amplified with a signals ratio  of 1.36 and a number per cell of 3.60. There was evidence of lymphovascular invasion.  Because of bleeding from the first biopsy, the second breast mass could not be performed. The lymph node was aspirated, not cord, and this appeared benign but the concordance is questionable.  The patient's subsequent history is as detailed below  INTERVAL HISTORY: Catherine Lawson returns today for follow-up of her breast cancer, alone. Today is day 8, cycle 3 of cyclophosphamide and docetaxel given every 21 days, with neulasta onpro given on day 2 for granulocyte support. She also receives trastuzumab every 3 weeks.   REVIEW OF SYSTEMS:  Catherine Lawson tolerated her first cycle of trastuzumab well. Her only complaint this week is taste changes. She is finding her appetite is down and she is having to force food and fluids at times. She continue to workout daily. She denies fevers, chills, nausea, vomiting, or changes in bowel or bladder habits. She did have a couple of episodes of palpitations over the weekend captured by her external heart monitor. A detailed review of systems is otherwise stable.  PAST MEDICAL HISTORY: Past Medical History  Diagnosis Date  . Breast cancer of upper-outer quadrant of right female breast ([place]) 02/06/2015  . Breast cancer ([place])   . Family history of breast cancer   . Family history of colon cancer   . Family history of ovarian cancer   . Family history of stomach cancer   . Arthritis     bil thumb joints  . Dysrhythmia     fast HR at times due to stress, no chest pain or SOB    PAST SURGICAL HISTORY: Past Surgical History  Procedure  Laterality Date  . Wisdom tooth extraction    . Radioactive seed guided mastectomy with axillary sentinel lymph node biopsy Right 05/29/2015    Procedure: RADIOACTIVE SEED RIGHT BREAST LUMPECTOMY WITH AXILLARY SENTINEL LYMPH NODE BIOPSY;  Surgeon: Excell Seltzer, MD;  Location: [facility];  Service: General;  Laterality: Right;  .  Re-excision of breast lumpectomy Right 06/12/2015    Procedure: RE-EXCISION OF RIGHT  BREAST LUMPECTOMY;  Surgeon: Excell Seltzer, MD;  [facility];  Service: General;  Laterality: Right;  . Portacath placement Right 06/27/2015    Procedure: INSERTION PORT-A-CATH;  Surgeon: Excell Seltzer, MD;  Location: [place];  Service: General;  Laterality: Right;    FAMILY HISTORY Family History  Problem Relation Age of Onset  . Breast cancer Sister 37  . Colon cancer Father 72  . Cancer Maternal Grandmother     probable ovarian cancer  . Stomach cancer Paternal Grandmother 36  . Brain cancer Other 10    Medulloblastoma  . Parkinson's disease Mother   . Lung cancer Paternal Uncle     smoker  . Stomach cancer Other     PGMs sister  . Stomach cancer Other     PMGs brother   the patient's father is still living, at age 20. The patient's mother died from Parkinson's disease complications at age 27. The patient had 5 brothers, 4 sisters. One sister was diagnosed with breast cancer at age 82 and died at age 10. The patient's father was diagnosed with colon cancer at the age of 38. A nephew was diagnosed with medulloblastoma at age 93 and died at age 85. The paternal grandmother was diagnosed with stomach cancer at age 41. The maternal grandmother was diagnosed with metastatic cancer of unknown type at age 27. (The description is suggestive of an ovarian cancer).  GYNECOLOGIC HISTORY:  No LMP recorded. Patient is postmenopausal. Menarche age 60. The patient is GX P0. She stopped having periods in 2014. She did not take hormone replacement. She took oral contraceptives for approximately 20 years with no complications.  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 Her husband Catherine Lawson works for a Merchandiser, retail in [employer] [employer] credited Warehouse manager". Catherine Lawson has 2 daughters from an earlier marriage, Catherine Lawson lives in [place] and works in recruiting, and Catherine Lawson lives in [place] and works in Press photographer. They have 1 grandchild. The patient attends the [employer]   HEALTH MAINTENANCE: Social History  Substance Use Topics  . Smoking status: Never Smoker   . Smokeless tobacco: Not on file  . Alcohol Use: Yes     Comment: social     Colonoscopy: 2014/lobe are  PAP: 2015  Bone density: Remote  Lipid panel:  No Known Allergies  Current Outpatient Prescriptions  Medication Sig Dispense Refill  . lidocaine-prilocaine (EMLA) cream Apply to affected area once 30 g 3  . loratadine (CLARITIN) 10 MG tablet Take 10 mg by mouth daily.    Marland Kitchen LORazepam (ATIVAN) 0.5 MG tablet Take 1 tablet (0.5 mg total) by mouth at bedtime as needed (Nausea or vomiting). 30 tablet 0  . tobramycin-dexamethasone (TOBRADEX) ophthalmic solution Place 1 drop into both eyes 2 (two) times daily. 5 mL 0  . carvedilol (COREG) 3.125 MG tablet Take 1 tablet (3.125 mg total) by mouth 2 (two) times daily. (Patient not taking: Reported on 08/14/2015) 60 tablet 4  .  dexamethasone (DECADRON) 4 MG tablet Take 1 tablet (4 mg total) by mouth 2 (two) times daily. Start the day before Taxotere. Then again the day after chemo for 3 days. (Patient not taking: Reported on 08/14/2015) 30 tablet 1  . halobetasol (ULTRAVATE) 0.05 % ointment Apply 1 application topically daily as needed (eczema). Reported on 08/16/2015    . ibuprofen (ADVIL,MOTRIN) 200 MG tablet Take 200 mg by mouth. Pt takes med days 5 - 7  after chemo; 2 tablets every 6-8 hours as needed    . ondansetron (ZOFRAN) 8 MG tablet Take 1 tablet (8 mg total) by mouth 2 (two) times daily. Start the day after chemo for 3 days. Then take as needed for nausea or vomiting. (Patient not taking: Reported on 08/14/2015) 30 tablet 1  . prochlorperazine (COMPAZINE) 10 MG tablet Take 1 tablet (10 mg total) by mouth every 6 (six) hours as needed (Nausea or vomiting).  (Patient not taking: Reported on 08/14/2015) 30 tablet 1   No current facility-administered medications for this visit.    OBJECTIVE: Middle-aged white woman who appears well Filed Vitals:   08/16/15 1321  BP: 125/66  Pulse: 108  Temp: 99.2 F (37.3 C)  Resp: 18     Body mass index is 24.81 kg/(m^2).    ECOG FS:0 - Asymptomatic  Sclerae unicteric, pupils round and equal Oropharynx clear and moist-- no thrush or other lesions No cervical or supraclavicular adenopathy Lungs no rales or rhonchi Heart regular rate and rhythm Abd soft, nontender, positive bowel sounds MSK no focal spinal tenderness, no upper extremity lymphedema Neuro: nonfocal, well oriented, appropriate affect Breasts: deferred  LAB RESULTS:  CMP     Component Value Date/Time   NA 141 08/09/2015 0752   K 4.1 08/09/2015 0752   CO2 24 08/09/2015 0752   GLUCOSE 103 08/09/2015 0752   BUN 15.5 08/09/2015 0752   CREATININE 0.8 08/09/2015 0752   CALCIUM 8.8 08/09/2015 0752   PROT 6.5 08/09/2015 0752   ALBUMIN 3.8 08/09/2015 0752   AST 15 08/09/2015 0752   ALT 22 08/09/2015 0752   ALKPHOS 101 08/09/2015 0752   BILITOT 0.48 08/09/2015 0752    INo results found for: SPEP, UPEP  Lab Results  Component Value Date   WBC 25.3* 08/16/2015   NEUTROABS 20.6* 08/16/2015   HGB 13.0 08/16/2015   HCT 39.2 08/16/2015   MCV 90.7 08/16/2015   PLT 317 08/16/2015      Chemistry      Component Value Date/Time   NA 141 08/09/2015 0752   K 4.1 08/09/2015 0752   CO2 24 08/09/2015 0752   BUN 15.5 08/09/2015 0752   CREATININE 0.8 08/09/2015 0752      Component Value Date/Time   CALCIUM 8.8 08/09/2015 0752   ALKPHOS 101 08/09/2015 0752   AST 15 08/09/2015 0752   ALT 22 08/09/2015 0752   BILITOT 0.48 08/09/2015 0752       No results found for: LABCA2  No components found for: LABCA125  No results for input(s): INR in the last 168 hours.  Urinalysis No results found for: COLORURINE, APPEARANCEUR, LABSPEC,  PHURINE, GLUCOSEU, HGBUR, BILIRUBINUR, KETONESUR, PROTEINUR, UROBILINOGEN, NITRITE, LEUKOCYTESUR  STUDIES: No results found.  ASSESSMENT: 60 y.o. [place] woman status post right breast biopsy 02/02/2015 for a clinically multifocal T1c NX, stage IA invasive ductal carcinoma, grade 2 or 3, estrogen and progesterone receptor positive, with HER-2 not amplified and the MIB-1 at 40%  (1) biopsy of a suspicious left breast mass  03/28/2015 showed only usual ductal hyperplasia, no malignancy identified.  (2) genetics testing 03/06/2015 through the Breast/Ovarian gene panel offered by [facility] found no deleterious mutations in ATM, BARD1, BRCA1, BRCA2, BRIP1, CDH1, CHEK2, EPCAM, FANCC, MLH1, MSH2, MSH6, NBN, PALB2, PMS2, PTEN, RAD51C, RAD51D, TP53, and XRCC2.  (a) there was a Variant of Unknown Significance in the BRCA2 gene called [id number]  (3) status post right lumpectomy and sentinel lymph node sampling 05/29/2015 for an mpT1b pN0, stage IA invasive ductal carcinoma, grade 2, with positive margins  (a) margins cleared with subsequent surgery 06/12/2015  (3) Oncotype score of 35 predicts a risk of outside the breast recurrence within 10 years of 24% if the patient's only systemic therapy is tamoxifen for 5 years. It also predicts significant benefit from chemotherapy.  (4) adjuvant chemotherapy to consist of cyclophosphamide and docetaxel 4, given 3 weeks apart, starting 06/27/2015.  (5) FISH panel returned HER-2 positive. Will begin trastuzumab every 3 weeks through 1 year, starting 08/08/14.   (a) echocardiogram on 07/11/15 showed an EF of 60-65%  (6) adjuvant radiation to follow chemotherapy  (7) received anastrozole 02/08/2015 through December 2016, to resume at the completion of radiation  PLAN: Catherine Lawson continues to tolerate treatment remarkably well. The labs were reviewed in detail. The leukocytosis is going to be related to neulasta use. Her potassium is up to 5.4 today, and she does  recall palpitations over the weekend. She is hesitant about using lasix at this time. She visits with Dr. Curt Bears tomorrow and she will let him know about this.  Catherine Lawson will return in 2 weeks for her 4th and final cycle of treatment. She understands and agrees with this plan. She knows the goal of treatment in her case is cure. She has been encouraged to call with any issues that might arise before her next visit here.   Laurie Panda, NP 08/16/2015

## 2015-08-17 ENCOUNTER — Ambulatory Visit (INDEPENDENT_AMBULATORY_CARE_PROVIDER_SITE_OTHER): Payer: Managed Care, Other (non HMO) | Admitting: Cardiology

## 2015-08-17 ENCOUNTER — Encounter: Payer: Self-pay | Admitting: Cardiology

## 2015-08-17 ENCOUNTER — Other Ambulatory Visit: Payer: Self-pay | Admitting: *Deleted

## 2015-08-17 VITALS — BP 130/74 | HR 80 | Ht 65.0 in | Wt 147.6 lb

## 2015-08-17 DIAGNOSIS — I471 Supraventricular tachycardia: Secondary | ICD-10-CM

## 2015-08-17 DIAGNOSIS — C50411 Malignant neoplasm of upper-outer quadrant of right female breast: Secondary | ICD-10-CM

## 2015-08-17 MED ORDER — FUROSEMIDE 20 MG PO TABS: 10.0000 mg | ORAL_TABLET | Freq: Every day | ORAL | Status: DC

## 2015-08-17 NOTE — Progress Notes (Signed)
Electrophysiology Office Note   Date:  08/17/2015   ID:  Catherine Lawson, DOB 27-Dec-1955, MRN [id number]  PCP:  Cari Caraway, MD  Cardiologist:  Tempie Hoist Primary Electrophysiologist: Constance Haw, MD    Chief Complaint  Patient presents with  . Advice Only     History of Present Illness: Catherine Lawson is a 60 y.o. female who presents today for electrophysiology evaluation.   She has a history of right-sided breast cancer diagnosed 02/02/15. She has a history of palpitations occurring for the last 20 years. She was evaluated 20 years ago with a stress test, echo, and heart monitor. She had not had episodes while wearing the monitor. Her last episode was 12/26, where she had a heart rate of 190. The episode lasted for 3 hours and was associated with mild dizziness. The episodes often break with Valsalva. She tends to have the monthly with occasional shorter episodes. She feels that stress is a trigger and has cut back on her coffee intake.  She is worn a cardiac monitor for the last week, and has had episodes of palpitations. She says that last Tuesday she had palpitations for most the day. On her monitor she does have episodes of narrow complex tachycardia with a heart rate of 190. She says that usually she can get the palpitations to stop when she bears down.   Today, she denies symptoms of palpitations, chest pain, shortness of breath, orthopnea, PND, lower extremity edema, claudication, dizziness, presyncope, syncope, bleeding, or neurologic sequela. The patient is tolerating medications without difficulties and is otherwise without complaint today.    Past Medical History  Diagnosis Date  . Breast cancer of upper-outer quadrant of right female breast ([facility]) 02/06/2015  . Breast cancer ([place])   . Family history of breast cancer   . Family history of colon cancer   . Family history of ovarian cancer   . Family history of stomach cancer   . Arthritis     bil thumb joints  .  Dysrhythmia     fast HR at times due to stress, no chest pain or SOB   Past Surgical History  Procedure Laterality Date  . Wisdom tooth extraction    . Radioactive seed guided mastectomy with axillary sentinel lymph node biopsy Right 05/29/2015    Procedure: RADIOACTIVE SEED RIGHT BREAST LUMPECTOMY WITH AXILLARY SENTINEL LYMPH NODE BIOPSY;  Surgeon: Excell Seltzer, MD;  Location: [facility];  Service: General;  Laterality: Right;  . Re-excision of breast lumpectomy Right 06/12/2015    Procedure: RE-EXCISION OF RIGHT  BREAST LUMPECTOMY;  Surgeon: Excell Seltzer, MD;  [facility];  Service: General;  Laterality: Right;  . Portacath placement Right 06/27/2015    Procedure: INSERTION PORT-A-CATH;  Surgeon: Excell Seltzer, MD;  Location: [facility];  Service: General;  Laterality: Right;     Current Outpatient Prescriptions  Medication Sig Dispense Refill  . carvedilol (COREG) 3.125 MG tablet Take 1 tablet (3.125 mg total) by mouth 2 (two) times daily. 60 tablet 4  . dexamethasone (DECADRON) 4 MG tablet Take 1 tablet (4 mg total) by mouth 2 (two) times daily. Start the day before Taxotere. Then again the day after chemo for 3 days. 30 tablet 1  . halobetasol (ULTRAVATE) 0.05 % ointment Apply 1 application topically daily as needed (eczema). Reported on 08/16/2015    . ibuprofen (ADVIL,MOTRIN) 200 MG tablet Take 200 mg by mouth. Pt takes med days 5 - 7  after chemo; 2  tablets every 6-8 hours as needed    . lidocaine-prilocaine (EMLA) cream Apply to affected area once 30 g 3  . loratadine (CLARITIN) 10 MG tablet Take 10 mg by mouth daily.    Marland Kitchen LORazepam (ATIVAN) 0.5 MG tablet Take 1 tablet (0.5 mg total) by mouth at bedtime as needed (Nausea or vomiting). 30 tablet 0  . ondansetron (ZOFRAN) 8 MG tablet Take 1 tablet (8 mg total) by mouth 2 (two) times daily. Start the day after chemo for 3 days. Then take as needed for nausea or vomiting. 30 tablet 1  .  prochlorperazine (COMPAZINE) 10 MG tablet Take 1 tablet (10 mg total) by mouth every 6 (six) hours as needed (Nausea or vomiting). 30 tablet 1  . tobramycin-dexamethasone (TOBRADEX) ophthalmic solution Place 1 drop into both eyes 2 (two) times daily. 5 mL 0   No current facility-administered medications for this visit.    Allergies:   Review of patient's allergies indicates no known allergies.   Social History:  The patient  reports that she has never smoked. She does not have any smokeless tobacco history on file. She reports that she drinks alcohol. She reports that she does not use illicit drugs.   Family History:  The patient's family history includes Brain cancer (age of onset: 73) in her other; Breast cancer (age of onset: 83) in her sister; Cancer in her maternal grandmother; Colon cancer (age of onset: 61) in her father; Lung cancer in her paternal uncle; Parkinson's disease in her mother; Stomach cancer in her other and other; Stomach cancer (age of onset: 33) in her paternal grandmother.    ROS:  Please see the history of present illness.   Otherwise, review of systems is positive for palpitations.   All other systems are reviewed and negative.    PHYSICAL EXAM: VS:  BP 130/74 mmHg  Pulse 80  Ht 5\' 5"  (1.651 m)  Wt 147 lb 9.6 oz (66.951 kg)  BMI 24.56 kg/m2 , BMI Body mass index is 24.56 kg/(m^2). GEN: Well nourished, well developed, in no acute distress HEENT: normal Neck: no JVD, carotid bruits, or masses Cardiac: RRR; no murmurs, rubs, or gallops,no edema  Respiratory:  clear to auscultation bilaterally, normal work of breathing GI: soft, nontender, nondistended, + BS MS: no deformity or atrophy Skin: warm and dry Neuro:  Strength and sensation are intact Psych: euthymic mood, full affect  EKG:  EKG is not ordered today. The ekg ordered 1/17shows sinus rhythm, rate 72  Recent Labs: 08/16/2015: ALT 46; BUN 15.1; Creatinine 1.0; HGB 13.0; Platelets 317; Potassium 5.4*;  Sodium 138    Lipid Panel  No results found for: CHOL, TRIG, HDL, CHOLHDL, VLDL, LDLCALC, LDLDIRECT   Wt Readings from Last 3 Encounters:  08/17/15 147 lb 9.6 oz (66.951 kg)  08/16/15 144 lb 9.6 oz (65.59 kg)  08/14/15 145 lb 9.6 oz (66.044 kg)      Other studies Reviewed: Additional studies/ records that were reviewed today include: TTE 07/11/15 Review of the above records today demonstrates:  - Left ventricle: The cavity size was normal. Wall thickness was normal. Systolic function was normal. The estimated ejection fraction was in the range of 60% to 65%. Wall motion was normal; there were no regional wall motion abnormalities. Left ventricular diastolic function parameters were normal.  Monitor: SVT with rate 190  ASSESSMENT AND PLAN:  1.  Palpitations: Had SVT on her monitor, which appears to be AVNRT. I discussed with her the possibilities of medical  management versus ablation. I discussed the risks of ablation which include bleeding, tamponade, stroke, and heart block. She understands these risks and wishes to proceed with ablation. She is currently on carvedilol and we'll continue that. She said that she would like to schedule ablation after her chemotherapy was done which is in April. We will have her follow-up in April and schedule ablation after that.  2. Hyperkalemia: Potassium of 5.4 at the last check. We'll give her 2 doses of Lasix and have it rechecked by her primary oncologist.  Current medicines are reviewed at length with the patient today.   The patient does not have concerns regarding her medicines.  The following changes were made today:  none  Labs/ tests ordered today include:  No orders of the defined types were placed in this encounter.     Disposition:   FU with Will Camnitz 2 months  Signed, Will Meredith Leeds, MD  08/17/2015 1:56 PM     [address] [place] [address] [phone]  (office) [phone] (fax) As she has associated

## 2015-08-17 NOTE — Patient Instructions (Addendum)
Medication Instructions:  Your physician has recommended you make the following change in your medication: 1) TAKE Lasix 10 mg once a day for two days.  Labwork: None ordered  Testing/Procedures: None ordered  Follow-Up: Your physician recommends that you schedule a follow-up appointment in: April with Dr. Curt Bears.  If you need a refill on your cardiac medications before your next appointment, please call your pharmacy.  Thank you for choosing [facility]!!   Trinidad Curet, RN [phone]

## 2015-08-21 ENCOUNTER — Other Ambulatory Visit: Payer: Self-pay | Admitting: *Deleted

## 2015-08-21 ENCOUNTER — Other Ambulatory Visit (HOSPITAL_BASED_OUTPATIENT_CLINIC_OR_DEPARTMENT_OTHER): Payer: Managed Care, Other (non HMO)

## 2015-08-21 ENCOUNTER — Telehealth: Payer: Self-pay | Admitting: Cardiology

## 2015-08-21 ENCOUNTER — Other Ambulatory Visit: Payer: Self-pay | Admitting: Nurse Practitioner

## 2015-08-21 DIAGNOSIS — C50411 Malignant neoplasm of upper-outer quadrant of right female breast: Secondary | ICD-10-CM

## 2015-08-21 LAB — CBC WITH DIFFERENTIAL/PLATELET
BASO%: 1.1 % (ref 0.0–2.0)
BASOS ABS: 0.2 10*3/uL — AB (ref 0.0–0.1)
EOS%: 0.4 % (ref 0.0–7.0)
Eosinophils Absolute: 0.1 10*3/uL (ref 0.0–0.5)
HEMATOCRIT: 38.1 % (ref 34.8–46.6)
HEMOGLOBIN: 12.4 g/dL (ref 11.6–15.9)
LYMPH#: 1.1 10*3/uL (ref 0.9–3.3)
LYMPH%: 5.3 % — ABNORMAL LOW (ref 14.0–49.7)
MCH: 29.9 pg (ref 25.1–34.0)
MCHC: 32.6 g/dL (ref 31.5–36.0)
MCV: 91.7 fL (ref 79.5–101.0)
MONO#: 0.7 10*3/uL (ref 0.1–0.9)
MONO%: 3.5 % (ref 0.0–14.0)
NEUT#: 18.4 10*3/uL — ABNORMAL HIGH (ref 1.5–6.5)
NEUT%: 89.7 % — ABNORMAL HIGH (ref 38.4–76.8)
Platelets: 214 10*3/uL (ref 145–400)
RBC: 4.15 10*6/uL (ref 3.70–5.45)
RDW: 15 % — AB (ref 11.2–14.5)
WBC: 20.5 10*3/uL — ABNORMAL HIGH (ref 3.9–10.3)

## 2015-08-21 LAB — COMPREHENSIVE METABOLIC PANEL
ALBUMIN: 3.5 g/dL (ref 3.5–5.0)
ALK PHOS: 200 U/L — AB (ref 40–150)
ALT: 54 U/L (ref 0–55)
ANION GAP: 8 meq/L (ref 3–11)
AST: 36 U/L — ABNORMAL HIGH (ref 5–34)
BILIRUBIN TOTAL: 0.41 mg/dL (ref 0.20–1.20)
BUN: 12.7 mg/dL (ref 7.0–26.0)
CALCIUM: 8.9 mg/dL (ref 8.4–10.4)
CO2: 21 meq/L — AB (ref 22–29)
CREATININE: 0.9 mg/dL (ref 0.6–1.1)
Chloride: 111 mEq/L — ABNORMAL HIGH (ref 98–109)
EGFR: 73 mL/min/{1.73_m2} — AB (ref >90)
Glucose: 104 mg/dl (ref 70–140)
Potassium: 5.9 mEq/L — ABNORMAL HIGH (ref 3.5–5.1)
Sodium: 140 mEq/L (ref 136–145)
TOTAL PROTEIN: 6.5 g/dL (ref 6.4–8.3)

## 2015-08-21 NOTE — Telephone Encounter (Signed)
New Message:  Pt called in stating that her Potassium levels are high and she would like to know if her Lasix dosage will need to increase to regulate her levels.

## 2015-08-21 NOTE — Telephone Encounter (Signed)
Informed patient that Dr. Curt Bears meant for primary oncologist be overseeing advisement on this matter. Patient states that they wanted to make sure Camnitz opinion of taking more Lasix with latest potassium level of 5.9. Explained that he is out of the office today and I will review with him tomorrow and let her know his recommendations. Patient verbalized understanding and agreeable to plan.

## 2015-08-22 NOTE — Telephone Encounter (Signed)
Explained that ok for primary oncologist to oversee this and make changes that are needed. Patient verbalized understanding and agreeable to plan.

## 2015-08-22 NOTE — Telephone Encounter (Signed)
Catherine Lawson is returning your call

## 2015-08-23 ENCOUNTER — Encounter: Payer: Self-pay | Admitting: Internal Medicine

## 2015-08-24 ENCOUNTER — Encounter: Payer: Self-pay | Admitting: *Deleted

## 2015-08-24 ENCOUNTER — Other Ambulatory Visit: Payer: Self-pay | Admitting: Nurse Practitioner

## 2015-08-28 ENCOUNTER — Other Ambulatory Visit: Payer: Self-pay | Admitting: *Deleted

## 2015-08-28 DIAGNOSIS — C50411 Malignant neoplasm of upper-outer quadrant of right female breast: Secondary | ICD-10-CM

## 2015-08-28 MED ORDER — DEXAMETHASONE 4 MG PO TABS: 4.0000 mg | ORAL_TABLET | Freq: Two times a day (BID) | ORAL | Status: DC

## 2015-08-30 ENCOUNTER — Ambulatory Visit: Payer: Managed Care, Other (non HMO)

## 2015-08-30 ENCOUNTER — Ambulatory Visit (HOSPITAL_BASED_OUTPATIENT_CLINIC_OR_DEPARTMENT_OTHER): Payer: Managed Care, Other (non HMO)

## 2015-08-30 ENCOUNTER — Encounter: Payer: Self-pay | Admitting: Nurse Practitioner

## 2015-08-30 ENCOUNTER — Other Ambulatory Visit: Payer: Self-pay | Admitting: *Deleted

## 2015-08-30 ENCOUNTER — Other Ambulatory Visit: Payer: Managed Care, Other (non HMO)

## 2015-08-30 ENCOUNTER — Other Ambulatory Visit (HOSPITAL_BASED_OUTPATIENT_CLINIC_OR_DEPARTMENT_OTHER): Payer: Managed Care, Other (non HMO)

## 2015-08-30 ENCOUNTER — Ambulatory Visit (HOSPITAL_BASED_OUTPATIENT_CLINIC_OR_DEPARTMENT_OTHER): Payer: Managed Care, Other (non HMO) | Admitting: Nurse Practitioner

## 2015-08-30 VITALS — BP 117/55 | HR 69 | Temp 97.7°F | Resp 18 | Ht 65.0 in | Wt 147.0 lb

## 2015-08-30 DIAGNOSIS — I471 Supraventricular tachycardia: Secondary | ICD-10-CM | POA: Diagnosis not present

## 2015-08-30 DIAGNOSIS — C50411 Malignant neoplasm of upper-outer quadrant of right female breast: Secondary | ICD-10-CM | POA: Diagnosis not present

## 2015-08-30 DIAGNOSIS — Z5112 Encounter for antineoplastic immunotherapy: Secondary | ICD-10-CM

## 2015-08-30 DIAGNOSIS — Z95828 Presence of other vascular implants and grafts: Secondary | ICD-10-CM

## 2015-08-30 DIAGNOSIS — C50911 Malignant neoplasm of unspecified site of right female breast: Secondary | ICD-10-CM

## 2015-08-30 DIAGNOSIS — C50912 Malignant neoplasm of unspecified site of left female breast: Secondary | ICD-10-CM

## 2015-08-30 DIAGNOSIS — Z5111 Encounter for antineoplastic chemotherapy: Secondary | ICD-10-CM | POA: Diagnosis not present

## 2015-08-30 LAB — COMPREHENSIVE METABOLIC PANEL
ALBUMIN: 3.3 g/dL — AB (ref 3.5–5.0)
ALK PHOS: 152 U/L — AB (ref 40–150)
ALT: 24 U/L (ref 0–55)
ANION GAP: 11 meq/L (ref 3–11)
AST: 16 U/L (ref 5–34)
BUN: 15.8 mg/dL (ref 7.0–26.0)
CALCIUM: 8.8 mg/dL (ref 8.4–10.4)
CO2: 20 mEq/L — ABNORMAL LOW (ref 22–29)
Chloride: 111 mEq/L — ABNORMAL HIGH (ref 98–109)
Creatinine: 0.8 mg/dL (ref 0.6–1.1)
EGFR: 81 mL/min/{1.73_m2} — AB (ref >90)
Glucose: 118 mg/dl (ref 70–140)
POTASSIUM: 4.4 meq/L (ref 3.5–5.1)
Sodium: 141 mEq/L (ref 136–145)
Total Bilirubin: 0.36 mg/dL (ref 0.20–1.20)
Total Protein: 6.3 g/dL — ABNORMAL LOW (ref 6.4–8.3)

## 2015-08-30 LAB — CBC WITH DIFFERENTIAL/PLATELET
BASO%: 0.6 % (ref 0.0–2.0)
BASOS ABS: 0 10*3/uL (ref 0.0–0.1)
EOS ABS: 0 10*3/uL (ref 0.0–0.5)
EOS%: 0.1 % (ref 0.0–7.0)
HEMATOCRIT: 33.1 % — AB (ref 34.8–46.6)
HEMOGLOBIN: 11.1 g/dL — AB (ref 11.6–15.9)
LYMPH#: 1.2 10*3/uL (ref 0.9–3.3)
LYMPH%: 15.9 % (ref 14.0–49.7)
MCH: 30.6 pg (ref 25.1–34.0)
MCHC: 33.5 g/dL (ref 31.5–36.0)
MCV: 91.4 fL (ref 79.5–101.0)
MONO#: 0.5 10*3/uL (ref 0.1–0.9)
MONO%: 6.8 % (ref 0.0–14.0)
NEUT#: 5.8 10*3/uL (ref 1.5–6.5)
NEUT%: 76.6 % (ref 38.4–76.8)
PLATELETS: 348 10*3/uL (ref 145–400)
RBC: 3.63 10*6/uL — ABNORMAL LOW (ref 3.70–5.45)
RDW: 15.4 % — AB (ref 11.2–14.5)
WBC: 7.5 10*3/uL (ref 3.9–10.3)

## 2015-08-30 LAB — LACTATE DEHYDROGENASE: LDH: 229 U/L (ref 125–245)

## 2015-08-30 MED ORDER — DEXTROSE 5 % IV SOLN
75.0000 mg/m2 | Freq: Once | INTRAVENOUS | Status: AC
  Administered 2015-08-30: 130 mg via INTRAVENOUS
  Filled 2015-08-30: qty 13

## 2015-08-30 MED ORDER — DIPHENHYDRAMINE HCL 25 MG PO CAPS
ORAL_CAPSULE | ORAL | Status: AC
Start: 2015-08-30 — End: 2015-08-30
  Filled 2015-08-30: qty 1

## 2015-08-30 MED ORDER — SODIUM CHLORIDE 0.9 % IV SOLN
Freq: Once | INTRAVENOUS | Status: AC
  Administered 2015-08-30: 10:00:00 via INTRAVENOUS

## 2015-08-30 MED ORDER — HEPARIN SOD (PORK) LOCK FLUSH 100 UNIT/ML IV SOLN
500.0000 [IU] | Freq: Once | INTRAVENOUS | Status: AC | PRN
  Administered 2015-08-30: 500 [IU]
  Filled 2015-08-30: qty 5

## 2015-08-30 MED ORDER — SODIUM CHLORIDE 0.9 % IV SOLN
Freq: Once | INTRAVENOUS | Status: AC
  Administered 2015-08-30: 10:00:00 via INTRAVENOUS
  Filled 2015-08-30: qty 8

## 2015-08-30 MED ORDER — DIPHENHYDRAMINE HCL 25 MG PO CAPS
25.0000 mg | ORAL_CAPSULE | Freq: Once | ORAL | Status: AC
  Administered 2015-08-30: 25 mg via ORAL

## 2015-08-30 MED ORDER — SODIUM CHLORIDE 0.9% FLUSH
10.0000 mL | INTRAVENOUS | Status: DC | PRN
  Administered 2015-08-30: 10 mL via INTRAVENOUS
  Filled 2015-08-30: qty 10

## 2015-08-30 MED ORDER — TRASTUZUMAB CHEMO INJECTION 440 MG
6.0000 mg/kg | Freq: Once | INTRAVENOUS | Status: AC
  Administered 2015-08-30: 399 mg via INTRAVENOUS
  Filled 2015-08-30: qty 19

## 2015-08-30 MED ORDER — ACETAMINOPHEN 325 MG PO TABS
650.0000 mg | ORAL_TABLET | Freq: Once | ORAL | Status: AC
  Administered 2015-08-30: 650 mg via ORAL

## 2015-08-30 MED ORDER — SODIUM CHLORIDE 0.9 % IJ SOLN
10.0000 mL | INTRAMUSCULAR | Status: DC | PRN
  Administered 2015-08-30: 10 mL
  Filled 2015-08-30: qty 10

## 2015-08-30 MED ORDER — SODIUM CHLORIDE 0.9 % IV SOLN
600.0000 mg/m2 | Freq: Once | INTRAVENOUS | Status: AC
  Administered 2015-08-30: 1040 mg via INTRAVENOUS
  Filled 2015-08-30: qty 52

## 2015-08-30 MED ORDER — ACETAMINOPHEN 325 MG PO TABS
ORAL_TABLET | ORAL | Status: AC
  Filled 2015-08-30: qty 2

## 2015-08-30 MED ORDER — LORAZEPAM 0.5 MG PO TABS
0.5000 mg | ORAL_TABLET | Freq: Every evening | ORAL | Status: DC | PRN
Start: 2015-08-30 — End: 2015-10-10

## 2015-08-30 MED ORDER — PEGFILGRASTIM 6 MG/0.6ML ~~LOC~~ PSKT
6.0000 mg | PREFILLED_SYRINGE | Freq: Once | SUBCUTANEOUS | Status: AC
  Administered 2015-08-30: 6 mg via SUBCUTANEOUS
  Filled 2015-08-30: qty 0.6

## 2015-08-30 NOTE — Progress Notes (Signed)
[facility]  Telephone:(336) [phone] Fax:(336) [phone]   ID: DAKARI STABLER DOB: 12-12-1955  MR#: [id number]  LNL#:[id number]  Patient Care Team: Cari Caraway, MD as PCP - General (Family Medicine) Excell Seltzer, MD as Consulting Physician (General Surgery) Chauncey Cruel, MD as Consulting Physician (Oncology) Kyung Rudd, MD as Consulting Physician (Radiation Oncology) Mauro Kaufmann, RN as Registered Nurse Rockwell Germany, RN as Registered Nurse Sylvan Cheese, NP as Nurse Practitioner (Nurse Practitioner) Vania Rea, MD as Consulting Physician (Obstetrics and Gynecology) PCP: Cari Caraway, MD OTHER MD:  CHIEF COMPLAINT: Estrogen receptor positive breast cancer  CURRENT TREATMENT: Adjuvant chemotherapy  BREAST CANCER HISTORY: From the original intake note:     "[name]" had routine screening mammography at [facility] 01/24/2015. This showed a 1.6 cm mass in the right breast at the 11:00 position, and on 0.8 cm irregular density also at the 11:00 position farther away from the nipple. On 02/01/2015 she underwent a unilateral right diagnostic mammography at [facility]. The breast density was category B. In addition to the 2 masses previously noted there was a cluster of calcifications at the 9:00 position. Ultrasonography the same day showed a 1.9 cm oval mass, which was hypoechoic with no vascularity and hard on L a Stogner if he, a 7 mm mass at the 10:00 position with no vascularity and intermediate L a Stogner 3, and a lymph node with uniform cortex thickening in the right axilla. The 2 masses in question were separated by 2 cm.  On 02/02/2015 the patient underwent biopsy of what appears to have been the larger of the 2 breast masses (I cannot locate the biopsy report). This documented invasive ductal carcinoma, grade 2 or 3, with micropapillary features, estrogen and progesterone receptor positive, with an MIB-1 of 40%, and HER-2 not amplified with a signals ratio  of 1.36 and a number per cell of 3.60. There was evidence of lymphovascular invasion.  Because of bleeding from the first biopsy, the second breast mass could not be performed. The lymph node was aspirated, not cord, and this appeared benign but the concordance is questionable.  The patient's subsequent history is as detailed below  INTERVAL HISTORY: Catherine Lawson returns today for follow-up of her breast cancer, alone. Today is day 1, cycle 4 of cyclophosphamide and docetaxel given every 21 days, with neulasta onpro given on day 2 for granulocyte support. She also receives trastuzumab every 3 weeks. Last Monday we redrew a potassium level, and it increased to 5.9 despite lasix use prescribed by Dr. Macky Lower office. I had her repeat lasix 54m daily x 2. She had more palpitations over the weekend.   REVIEW OF SYSTEMS:  [name] denies fevers ,chills, nausea, vomiting, or changes in bowel or bladder habits. Her taste buds are starting to return to normal, as they change acutely after treatment. She is fatigued but continues to workout daily. She denies mouth sores, rashes, or neuropathy symptoms. Over the weekend with her palpitations she felt like her head was pounding simultaneously. She denies shortness of breath, chest pain, or associated cough. A detailed review of systems is otherwise stable.  PAST MEDICAL HISTORY: Past Medical History  Diagnosis Date  . Breast cancer of [place] quadrant of right [place] breast [place] 02/06/2015  . Breast cancer ([place]   . Family history of breast cancer   . Family history of colon cancer   . Family history of ovarian cancer   . Family history of stomach cancer   . Arthritis  bil thumb joints  . Dysrhythmia     fast HR at times due to stress, no chest pain or SOB    PAST SURGICAL HISTORY: Past Surgical History  Procedure Laterality Date  . Wisdom tooth extraction    . Radioactive seed guided mastectomy with axillary sentinel lymph node biopsy Right  05/29/2015    Procedure: RADIOACTIVE SEED RIGHT BREAST LUMPECTOMY WITH AXILLARY SENTINEL LYMPH NODE BIOPSY;  Surgeon: Excell Seltzer, MD;  Location: [facility];  Service: General;  Laterality: Right;  . Re-excision of breast lumpectomy Right 06/12/2015    Procedure: RE-EXCISION OF RIGHT  BREAST LUMPECTOMY;  Surgeon: Excell Seltzer, MD;  [facility];  Service: General;  Laterality: Right;  . Portacath placement Right 06/27/2015    Procedure: INSERTION PORT-A-CATH;  Surgeon: Excell Seltzer, MD;  Location: [facility];  Service: General;  Laterality: Right;    FAMILY HISTORY Family History  Problem Relation Age of Onset  . Breast cancer Sister 80  . Colon cancer Father 80  . Cancer Maternal Grandmother     probable ovarian cancer  . Stomach cancer Paternal Grandmother 46  . Brain cancer Other 10    Medulloblastoma  . Parkinson's disease Mother   . Lung cancer Paternal Uncle     smoker  . Stomach cancer Other     PGMs sister  . Stomach cancer Other     PMGs brother   the patient's father is still living, at age 46. The patient's mother died from Parkinson's disease complications at age 32. The patient had 5 brothers, 4 sisters. One sister was diagnosed with breast cancer at age 55 and died at age 36. The patient's father was diagnosed with colon cancer at the age of 43. A nephew was diagnosed with medulloblastoma at age 39 and died at age 35. The paternal grandmother was diagnosed with stomach cancer at age 24. The maternal grandmother was diagnosed with metastatic cancer of unknown type at age 19. (The description is suggestive of an ovarian cancer).  GYNECOLOGIC HISTORY:  No LMP recorded. Patient is postmenopausal. Menarche age 71. The patient is GX P0. She stopped having periods in 2014. She did not take hormone replacement. She took oral contraceptives for approximately 20 years with no complications.  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 Her husband [name] works for a Merchandiser, retail in [employer] credited Warehouse manager". [name] has 2 daughters from an earlier marriage, Ronalee Belts lives in [place] and works in recruiting, and Kian Gamarra lives in [place] and works in Press photographer. They have 1 grandchild. The patient attends the [employer]   HEALTH MAINTENANCE: Social History  Substance Use Topics  . Smoking status: Never Smoker   . Smokeless tobacco: Not on file  . Alcohol Use: Yes     Comment: social     Colonoscopy: 2014/lobe are  PAP: 2015  Bone density: Remote  Lipid panel:  No Known Allergies  Current Outpatient Prescriptions  Medication Sig Dispense Refill  . carvedilol (COREG) 3.125 MG tablet Take 1 tablet (3.125 mg total) by mouth 2 (two) times daily. (Patient taking differently: Take 3.125 mg by mouth 3 (three) times daily. ) 60 tablet 4  . dexamethasone (DECADRON) 4 MG tablet Take 1 tablet (4 mg total) by mouth 2 (two) times daily. Start the day before Taxotere. Then again the day after chemo for 3 days.  30 tablet 1  . furosemide (LASIX) 20 MG tablet Take 0.5 tablets (10 mg total) by mouth daily. For 2 days 10 tablet 0  . lidocaine-prilocaine (EMLA) cream Apply to affected area once 30 g 3  . loratadine (CLARITIN) 10 MG tablet Take 10 mg by mouth daily.    Marland Kitchen LORazepam (ATIVAN) 0.5 MG tablet Take 1 tablet (0.5 mg total) by mouth at bedtime as needed (Nausea or vomiting). 30 tablet 0  . ondansetron (ZOFRAN) 8 MG tablet Take 1 tablet (8 mg total) by mouth 2 (two) times daily. Start the day after chemo for 3 days. Then take as needed for nausea or vomiting. 30 tablet 1  . tobramycin-dexamethasone (TOBRADEX) ophthalmic solution Place 1 drop into both eyes 2 (two) times daily. 5 mL 0  . halobetasol (ULTRAVATE) 0.05 % ointment Apply 1 application topically daily as needed (eczema). Reported on 08/30/2015      . ibuprofen (ADVIL,MOTRIN) 200 MG tablet Take 200 mg by mouth. Reported on 08/30/2015    . prochlorperazine (COMPAZINE) 10 MG tablet Take 1 tablet (10 mg total) by mouth every 6 (six) hours as needed (Nausea or vomiting). (Patient not taking: Reported on 08/30/2015) 30 tablet 1   No current facility-administered medications for this visit.    OBJECTIVE: Middle-aged white woman who appears well Filed Vitals:   08/30/15 0854  BP: 117/55  Pulse: 69  Temp: 97.7 F (36.5 C)  Resp: 18     Body mass index is 24.46 kg/(m^2).    ECOG FS:0 - Asymptomatic  Skin: warm, dry  HEENT: sclerae anicteric, conjunctivae pink, oropharynx clear. No thrush or mucositis.  Lymph Nodes: No cervical or supraclavicular lymphadenopathy  Lungs: clear to auscultation bilaterally, no rales, wheezes, or rhonci  Heart: regular rate and rhythm  Abdomen: round, soft, non tender, positive bowel sounds  Musculoskeletal: No focal spinal tenderness, no peripheral edema  Neuro: non focal, well oriented, positive affect  Breasts: deferred  LAB RESULTS:  CMP     Component Value Date/Time   NA 141 08/30/2015 0819   K 4.4 08/30/2015 0819   CO2 20* 08/30/2015 0819   GLUCOSE 118 08/30/2015 0819   BUN 15.8 08/30/2015 0819   CREATININE 0.8 08/30/2015 0819   CALCIUM 8.8 08/30/2015 0819   PROT 6.3* 08/30/2015 0819   ALBUMIN 3.3* 08/30/2015 0819   AST 16 08/30/2015 0819   ALT 24 08/30/2015 0819   ALKPHOS 152* 08/30/2015 0819   BILITOT 0.36 08/30/2015 0819    INo results found for: SPEP, UPEP  Lab Results  Component Value Date   WBC 7.5 08/30/2015   NEUTROABS 5.8 08/30/2015   HGB 11.1* 08/30/2015   HCT 33.1* 08/30/2015   MCV 91.4 08/30/2015   PLT 348 08/30/2015      Chemistry      Component Value Date/Time   NA 141 08/30/2015 0819   K 4.4 08/30/2015 0819   CO2 20* 08/30/2015 0819   BUN 15.8 08/30/2015 0819   CREATININE 0.8 08/30/2015 0819      Component Value Date/Time   CALCIUM 8.8 08/30/2015 0819    ALKPHOS 152* 08/30/2015 0819   AST 16 08/30/2015 0819   ALT 24 08/30/2015 0819   BILITOT 0.36 08/30/2015 0819       No results found for: LABCA2  No components found for: HWTUU828  No results for input(s): INR in the last 168 hours.  Urinalysis No results found for: COLORURINE, APPEARANCEUR, [place], [place], [place], [place], [place], [place], [place], [place], NITRITE, LEUKOCYTESUR  STUDIES: No results found.  ASSESSMENT: 60 y.o. [place] woman status post right breast biopsy 02/02/2015 for a clinically multifocal T1c NX, stage IA invasive ductal carcinoma, grade 2 or 3, estrogen and progesterone receptor positive, with HER-2 not amplified and the MIB-1 at 40%  (1) biopsy of a suspicious left breast mass 03/28/2015 showed only usual ductal hyperplasia, no malignancy identified.  (2) genetics testing 03/06/2015 through the Breast/Ovarian gene panel offered by [facility] found no deleterious mutations in ATM, BARD1, BRCA1, BRCA2, BRIP1, CDH1, CHEK2, EPCAM, FANCC, MLH1, MSH2, MSH6, NBN, PALB2, PMS2, PTEN, RAD51C, RAD51D, TP53, and XRCC2.  (a) there was a Variant of Unknown Significance in the BRCA2 gene called [id number]  (3) status post right lumpectomy and sentinel lymph node sampling 05/29/2015 for an mpT1b pN0, stage IA invasive ductal carcinoma, grade 2, with positive margins  (a) margins cleared with subsequent surgery 06/12/2015  (3) Oncotype score of 35 predicts a risk of outside the breast recurrence within 10 years of 24% if the patient's only systemic therapy is tamoxifen for 5 years. It also predicts significant benefit from chemotherapy.  (4) adjuvant chemotherapy to consist of cyclophosphamide and docetaxel 4, given 3 weeks apart, starting 06/27/2015.  (5) FISH panel returned HER-2 positive. Will begin trastuzumab every 3 weeks through 1 year, starting 08/08/14.   (a) echocardiogram on 07/11/15 showed an EF of 60-65%  (6) adjuvant radiation to follow  chemotherapy  (7) received anastrozole 02/08/2015 through December 2016, to resume at the completion of radiation  PLAN: Catherine Lawson is excited to be completing chemotherapy today. The labs were reviewed in detail and were stable. Her potassium is normal at 4.4 today, so we will continue to hold lasix. We will continue to monitor this value, it may contribute slightly to her history of SVT. She will proceed with her 4th and final cycle of cyclophosphamide and docetaxel as planned.  Catherine Lawson will return in 1 week for labs and a nadir visit. She understands and agrees with this plan. She knows the goal of treatment in her case is cure. She has been encouraged to call with any issues that might arise before her next visit here.   Laurie Panda, NP 08/30/2015

## 2015-08-30 NOTE — Patient Instructions (Signed)

## 2015-08-30 NOTE — Patient Instructions (Signed)
[facility] Discharge Instructions for Patients Receiving Chemotherapy  Today you received the following chemotherapy agents: Taxotere and Cytoxan.  To help prevent nausea and vomiting after your treatment, we encourage you to take your nausea medication: Zofran 8 mg every 12 hours as needed; compazine 10 mg every 6 hours as needed.   If you develop nausea and vomiting that is not controlled by your nausea medication, call the clinic.   BELOW ARE SYMPTOMS THAT SHOULD BE REPORTED IMMEDIATELY:  *FEVER GREATER THAN 100.5 F  *CHILLS WITH OR WITHOUT FEVER  NAUSEA AND VOMITING THAT IS NOT CONTROLLED WITH YOUR NAUSEA MEDICATION  *UNUSUAL SHORTNESS OF BREATH  *UNUSUAL BRUISING OR BLEEDING  TENDERNESS IN MOUTH AND THROAT WITH OR WITHOUT PRESENCE OF ULCERS  *URINARY PROBLEMS  *BOWEL PROBLEMS  UNUSUAL RASH Items with * indicate a potential emergency and should be followed up as soon as possible.  Feel free to call the clinic you have any questions or concerns. The clinic phone number is [phone].  Please show the [facility] at check-in to the Emergency Department and triage nurse.

## 2015-08-31 ENCOUNTER — Encounter: Payer: Self-pay | Admitting: *Deleted

## 2015-09-01 ENCOUNTER — Other Ambulatory Visit: Payer: Self-pay | Admitting: Nurse Practitioner

## 2015-09-01 ENCOUNTER — Telehealth (HOSPITAL_COMMUNITY): Payer: Self-pay | Admitting: Vascular Surgery

## 2015-09-01 NOTE — Telephone Encounter (Signed)
Left message to make f/u appt w/ echo

## 2015-09-06 ENCOUNTER — Encounter: Payer: Self-pay | Admitting: Nurse Practitioner

## 2015-09-06 ENCOUNTER — Other Ambulatory Visit (HOSPITAL_BASED_OUTPATIENT_CLINIC_OR_DEPARTMENT_OTHER): Payer: Managed Care, Other (non HMO)

## 2015-09-06 ENCOUNTER — Ambulatory Visit (HOSPITAL_BASED_OUTPATIENT_CLINIC_OR_DEPARTMENT_OTHER): Payer: Managed Care, Other (non HMO) | Admitting: Nurse Practitioner

## 2015-09-06 VITALS — BP 98/52 | HR 83 | Temp 97.9°F | Wt 142.4 lb

## 2015-09-06 DIAGNOSIS — C50411 Malignant neoplasm of upper-outer quadrant of right female breast: Secondary | ICD-10-CM

## 2015-09-06 DIAGNOSIS — I471 Supraventricular tachycardia: Secondary | ICD-10-CM

## 2015-09-06 DIAGNOSIS — C50912 Malignant neoplasm of unspecified site of left female breast: Secondary | ICD-10-CM

## 2015-09-06 DIAGNOSIS — C50911 Malignant neoplasm of unspecified site of right female breast: Secondary | ICD-10-CM

## 2015-09-06 DIAGNOSIS — N6092 Unspecified benign mammary dysplasia of left breast: Secondary | ICD-10-CM

## 2015-09-06 LAB — COMPREHENSIVE METABOLIC PANEL
ALT: 34 U/L (ref 0–55)
AST: 17 U/L (ref 5–34)
Albumin: 3.4 g/dL — ABNORMAL LOW (ref 3.5–5.0)
Alkaline Phosphatase: 149 U/L (ref 40–150)
Anion Gap: 10 mEq/L (ref 3–11)
BILIRUBIN TOTAL: 0.52 mg/dL (ref 0.20–1.20)
BUN: 11.3 mg/dL (ref 7.0–26.0)
CO2: 24 meq/L (ref 22–29)
CREATININE: 0.8 mg/dL (ref 0.6–1.1)
Calcium: 8.9 mg/dL (ref 8.4–10.4)
Chloride: 108 mEq/L (ref 98–109)
EGFR: 77 mL/min/{1.73_m2} — ABNORMAL LOW (ref >90)
GLUCOSE: 79 mg/dL (ref 70–140)
Potassium: 4.7 mEq/L (ref 3.5–5.1)
SODIUM: 142 meq/L (ref 136–145)
TOTAL PROTEIN: 6.3 g/dL — AB (ref 6.4–8.3)

## 2015-09-06 LAB — CBC WITH DIFFERENTIAL/PLATELET
BASO%: 1.2 % (ref 0.0–2.0)
Basophils Absolute: 0.1 10*3/uL (ref 0.0–0.1)
EOS%: 3 % (ref 0.0–7.0)
Eosinophils Absolute: 0.4 10*3/uL (ref 0.0–0.5)
HCT: 36 % (ref 34.8–46.6)
HEMOGLOBIN: 11.7 g/dL (ref 11.6–15.9)
LYMPH%: 13 % — AB (ref 14.0–49.7)
MCH: 30.5 pg (ref 25.1–34.0)
MCHC: 32.5 g/dL (ref 31.5–36.0)
MCV: 93.8 fL (ref 79.5–101.0)
MONO#: 2.8 10*3/uL — AB (ref 0.1–0.9)
MONO%: 23.7 % — AB (ref 0.0–14.0)
NEUT%: 59.1 % (ref 38.4–76.8)
NEUTROS ABS: 6.9 10*3/uL — AB (ref 1.5–6.5)
Platelets: 307 10*3/uL (ref 145–400)
RBC: 3.84 10*6/uL (ref 3.70–5.45)
RDW: 14.8 % — AB (ref 11.2–14.5)
WBC: 11.7 10*3/uL — AB (ref 3.9–10.3)
lymph#: 1.5 10*3/uL (ref 0.9–3.3)

## 2015-09-06 NOTE — Progress Notes (Signed)
[place]  [phone] [phone]   ID: SHAREESE MACHA DOB: Nov 10, 1955  MR#: [id number]  KGU#:[id number]  Patient Care Team: Cari Caraway, MD as PCP - General (Family Medicine) Excell Seltzer, MD as Consulting Physician (General Surgery) Chauncey Cruel, MD as Consulting Physician (Oncology) Kyung Rudd, MD as Consulting Physician (Radiation Oncology) Mauro Kaufmann, RN as Registered Nurse Rockwell Germany, RN as Registered Nurse Sylvan Cheese, NP as Nurse Practitioner (Nurse Practitioner) Vania Rea, MD as Consulting Physician (Obstetrics and Gynecology) PCP: Cari Caraway, MD OTHER MD:  CHIEF COMPLAINT: Estrogen receptor positive breast cancer  CURRENT TREATMENT: Adjuvant chemotherapy  BREAST CANCER HISTORY: From the original intake note:     "Catherine Lawson" had routine screening mammography at [facility] 01/24/2015. This showed a 1.6 cm mass in the right breast at the 11:00 position, and on 0.8 cm irregular density also at the 11:00 position farther away from the nipple. On 02/01/2015 she underwent a unilateral right diagnostic mammography at [facility]. The breast density was category B. In addition to the 2 masses previously noted there was a cluster of calcifications at the 9:00 position. Ultrasonography the same day showed a 1.9 cm oval mass, which was hypoechoic with no vascularity and hard on L a Stogner if he, a 7 mm mass at the 10:00 position with no vascularity and intermediate L a Stogner 3, and a lymph node with uniform cortex thickening in the right axilla. The 2 masses in question were separated by 2 cm.  On 02/02/2015 the patient underwent biopsy of what appears to have been the larger of the 2 breast masses (I cannot locate the biopsy report). This documented invasive ductal carcinoma, grade 2 or 3, with micropapillary features, estrogen and progesterone receptor positive, with an MIB-1 of 40%, and HER-2 not amplified with a signals ratio  of 1.36 and a number per cell of 3.60. There was evidence of lymphovascular invasion.  Because of bleeding from the first biopsy, the second breast mass could not be performed. The lymph node was aspirated, not cord, and this appeared benign but the concordance is questionable.  The patient's subsequent history is as detailed below  INTERVAL HISTORY: Catherine Lawson returns today for follow-up of her breast cancer, alone. Today is day 8, cycle 4 of cyclophosphamide and docetaxel given every 21 days, with neulasta onpro given on day 2 for granulocyte support. She also receives trastuzumab every 3 weeks.  REVIEW OF SYSTEMS:  Catherine Lawson was fatigued for most of the week last week. This began to improve on Monday of this week. Over the weekend she had more palpitations with her head "pounding" simultaneously. She has no shortness of breath, chest pain, cough, or palpitations. Her appetite is down secondary to taste changes and she has lost 5lb in the past week. She denies mouth sores, rashes, or neuropathy symptoms.  A detailed review of systems is otherwise stable.  PAST MEDICAL HISTORY: Past Medical History  Diagnosis Date  . Breast cancer of upper-outer quadrant of right female breast ([place]) 02/06/2015  . Breast cancer ([place])   . Family history of breast cancer   . Family history of colon cancer   . Family history of ovarian cancer   . Family history of stomach cancer   . Arthritis     bil thumb joints  . Dysrhythmia     fast HR at times due to stress, no chest pain or SOB    PAST SURGICAL HISTORY: Past Surgical History  Procedure Laterality Date  .  Wisdom tooth extraction    . Radioactive seed guided mastectomy with axillary sentinel lymph node biopsy Right 05/29/2015    Procedure: RADIOACTIVE SEED RIGHT BREAST LUMPECTOMY WITH AXILLARY SENTINEL LYMPH NODE BIOPSY;  Surgeon: Excell Seltzer, MD;  Location: [place];  Service: General;  Laterality: Right;  . Re-excision of breast  lumpectomy Right 06/12/2015    Procedure: RE-EXCISION OF RIGHT  BREAST LUMPECTOMY;  Surgeon: Excell Seltzer, MD;  Location: [facility];  Service: General;  Laterality: Right;  . Portacath placement Right 06/27/2015    Procedure: INSERTION PORT-A-CATH;  Surgeon: Excell Seltzer, MD;  Location: [place];  Service: General;  Laterality: Right;    FAMILY HISTORY Family History  Problem Relation Age of Onset  . Breast cancer Sister 79  . Colon cancer Father 82  . Cancer Maternal Grandmother     probable ovarian cancer  . Stomach cancer Paternal Grandmother 27  . Brain cancer Other 10    Medulloblastoma  . Parkinson's disease Mother   . Lung cancer Paternal Uncle     smoker  . Stomach cancer Other     PGMs sister  . Stomach cancer Other     PMGs brother   the patient's father is still living, at age 47. The patient's mother died from Parkinson's disease complications at age 13. The patient had 5 brothers, 4 sisters. One sister was diagnosed with breast cancer at age 60 and died at age 34. The patient's father was diagnosed with colon cancer at the age of 56. A nephew was diagnosed with medulloblastoma at age 19 and died at age 9. The paternal grandmother was diagnosed with stomach cancer at age 82. The maternal grandmother was diagnosed with metastatic cancer of unknown type at age 41. (The description is suggestive of an ovarian cancer).  GYNECOLOGIC HISTORY:  No LMP recorded. Patient is postmenopausal. Menarche age 50. The patient is GX P0. She stopped having periods in 2014. She did not take hormone replacement. She took oral contraceptives for approximately 20 years with no complications.  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 Her husband Catherine Lawson works for a Merchandiser, retail in [employer] "[employer] credited Warehouse manager". Catherine Lawson has 2 daughters from an earlier marriage, Ronalee Belts lives in [place] and works in  recruiting, and Antonea Gaut lives in [place] and works in Press photographer. They have 1 grandchild. The patient attends the [employer]   HEALTH MAINTENANCE: Social History  Substance Use Topics  . Smoking status: Never Smoker   . Smokeless tobacco: Not on file  . Alcohol Use: Yes     Comment: social     Colonoscopy: 2014/lobe are  PAP: 2015  Bone density: Remote  Lipid panel:  No Known Allergies  Current Outpatient Prescriptions  Medication Sig Dispense Refill  . carvedilol (COREG) 3.125 MG tablet Take 1 tablet (3.125 mg total) by mouth 2 (two) times daily. (Patient taking differently: Take 3.125 mg by mouth 3 (three) times daily. ) 60 tablet 4  . furosemide (LASIX) 20 MG tablet Take 0.5 tablets (10 mg total) by mouth daily. For 2 days 10 tablet 0  . lidocaine-prilocaine (EMLA) cream Apply to affected area once 30 g 3  . loratadine (CLARITIN) 10 MG tablet Take 10 mg by mouth daily.    Marland Kitchen LORazepam (ATIVAN) 0.5 MG tablet Take 1 tablet (0.5 mg total) by mouth at bedtime as needed (Nausea  or vomiting). 30 tablet 0  . ondansetron (ZOFRAN) 8 MG tablet Take 1 tablet (8 mg total) by mouth 2 (two) times daily. Start the day after chemo for 3 days. Then take as needed for nausea or vomiting. 30 tablet 1  . halobetasol (ULTRAVATE) 0.05 % ointment Apply 1 application topically daily as needed (eczema). Reported on 09/06/2015    . ibuprofen (ADVIL,MOTRIN) 200 MG tablet Take 200 mg by mouth. Reported on 09/06/2015     No current facility-administered medications for this visit.    OBJECTIVE: Middle-aged white woman who appears well Filed Vitals:   09/06/15 0922  BP: 98/52  Pulse: 83  Temp: 97.9 F (36.6 C)     Body mass index is 23.7 kg/(m^2).    ECOG FS:0 - Asymptomatic  Skin: warm, dry  HEENT: sclerae anicteric, conjunctivae pink, oropharynx clear. No thrush or mucositis.  Lymph Nodes: No cervical or supraclavicular lymphadenopathy  Lungs: clear to  auscultation bilaterally, no rales, wheezes, or rhonci  Heart: regular rate and rhythm  Abdomen: round, soft, non tender, positive bowel sounds  Musculoskeletal: No focal spinal tenderness, no peripheral edema  Neuro: non focal, well oriented, positive affect  Breasts: deferred  LAB RESULTS:  CMP     Component Value Date/Time   NA 142 09/06/2015 0910   K 4.7 09/06/2015 0910   CO2 24 09/06/2015 0910   GLUCOSE 79 09/06/2015 0910   BUN 11.3 09/06/2015 0910   CREATININE 0.8 09/06/2015 0910   CALCIUM 8.9 09/06/2015 0910   PROT 6.3* 09/06/2015 0910   ALBUMIN 3.4* 09/06/2015 0910   AST 17 09/06/2015 0910   ALT 34 09/06/2015 0910   ALKPHOS 149 09/06/2015 0910   BILITOT 0.52 09/06/2015 0910    INo results found for: SPEP, UPEP  Lab Results  Component Value Date   WBC 11.7* 09/06/2015   NEUTROABS 6.9* 09/06/2015   HGB 11.7 09/06/2015   HCT 36.0 09/06/2015   MCV 93.8 09/06/2015   PLT 307 09/06/2015      Chemistry      Component Value Date/Time   NA 142 09/06/2015 0910   K 4.7 09/06/2015 0910   CO2 24 09/06/2015 0910   BUN 11.3 09/06/2015 0910   CREATININE 0.8 09/06/2015 0910      Component Value Date/Time   CALCIUM 8.9 09/06/2015 0910   ALKPHOS 149 09/06/2015 0910   AST 17 09/06/2015 0910   ALT 34 09/06/2015 0910   BILITOT 0.52 09/06/2015 0910       No results found for: LABCA2  No components found for: [id number]  No results for input(s): INR in the last 168 hours.  Urinalysis No results found for: COLORURINE, APPEARANCEUR, LABSPEC, PHURINE, GLUCOSEU, HGBUR, BILIRUBINUR, KETONESUR, PROTEINUR, UROBILINOGEN, NITRITE, LEUKOCYTESUR  STUDIES: No results found.  ASSESSMENT: 60 y.o. Early woman status post right breast biopsy 02/02/2015 for a clinically multifocal T1c NX, stage IA invasive ductal carcinoma, grade 2 or 3, estrogen and progesterone receptor positive, with HER-2 not amplified and the MIB-1 at 40%  (1) biopsy of a suspicious left breast mass  03/28/2015 showed only usual ductal hyperplasia, no malignancy identified.  (2) genetics testing 03/06/2015 through the Breast/Ovarian gene panel offered by [facility] found no deleterious mutations in ATM, BARD1, BRCA1, BRCA2, BRIP1, CDH1, CHEK2, EPCAM, FANCC, MLH1, MSH2, MSH6, NBN, PALB2, PMS2, PTEN, RAD51C, RAD51D, TP53, and XRCC2.  (a) there was a Variant of Unknown Significance in the BRCA2 gene called [id number]  (3) status post right lumpectomy and sentinel lymph node sampling 05/29/2015  for an mpT1b pN0, stage IA invasive ductal carcinoma, grade 2, with positive margins  (a) margins cleared with subsequent surgery 06/12/2015  (3) Oncotype score of 35 predicts a risk of outside the breast recurrence within 10 years of 24% if the patient's only systemic therapy is tamoxifen for 5 years. It also predicts significant benefit from chemotherapy.  (4) adjuvant chemotherapy to consist of cyclophosphamide and docetaxel 4, given 3 weeks apart, starting 06/27/2015.  (5) FISH panel returned HER-2 positive. Will begin trastuzumab every 3 weeks through 1 year, starting 08/08/14.   (a) echocardiogram on 07/11/15 showed an EF of 60-65%  (6) adjuvant radiation to follow chemotherapy  (7) received anastrozole 02/08/2015 through December 2016, to resume at the completion of radiation  PLAN: Catherine Lawson and I reviewed the labs in detail. Her potassium is normal at 4.7, so we will hold on lasix use for now. She will continue to have labs performed every 3 weeks with trastuzumab in the future so she will hold on to this pill bottle just in case. She will likely have a cardiac ablation in the spring for her palpitations. She will continue on carvedilol per cardiology.   Catherine Lawson will return in late March for follow up with Dr. Jana Hakim. She will not restart the anastrozole until the conclusion of radiation. She understands and agrees with this plan. She knows the goal of treatment in her case is cure. She has been  encouraged to call with any issues that might arise before her next visit here.   Laurie Panda, NP 09/06/2015

## 2015-09-07 ENCOUNTER — Other Ambulatory Visit: Payer: Self-pay

## 2015-09-07 ENCOUNTER — Telehealth: Payer: Self-pay | Admitting: Cardiology

## 2015-09-07 ENCOUNTER — Encounter (HOSPITAL_COMMUNITY): Payer: Self-pay | Admitting: Emergency Medicine

## 2015-09-07 ENCOUNTER — Observation Stay (HOSPITAL_COMMUNITY)
Admission: EM | Admit: 2015-09-07 | Discharge: 2015-09-08 | Disposition: A | Discharge status: Home / Self Care | Payer: Managed Care, Other (non HMO) | Attending: Cardiology | Admitting: Cardiology

## 2015-09-07 DIAGNOSIS — Z79899 Other long term (current) drug therapy: Secondary | ICD-10-CM | POA: Insufficient documentation

## 2015-09-07 DIAGNOSIS — Z853 Personal history of malignant neoplasm of breast: Secondary | ICD-10-CM | POA: Diagnosis not present

## 2015-09-07 DIAGNOSIS — I471 Supraventricular tachycardia, unspecified: Secondary | ICD-10-CM | POA: Diagnosis present

## 2015-09-07 DIAGNOSIS — E860 Dehydration: Secondary | ICD-10-CM | POA: Diagnosis not present

## 2015-09-07 DIAGNOSIS — I499 Cardiac arrhythmia, unspecified: Secondary | ICD-10-CM | POA: Diagnosis not present

## 2015-09-07 DIAGNOSIS — R000 Tachycardia, unspecified: Secondary | ICD-10-CM | POA: Diagnosis present

## 2015-09-07 DIAGNOSIS — M1990 Unspecified osteoarthritis, unspecified site: Secondary | ICD-10-CM | POA: Insufficient documentation

## 2015-09-07 DIAGNOSIS — I248 Other forms of acute ischemic heart disease: Secondary | ICD-10-CM

## 2015-09-07 LAB — BASIC METABOLIC PANEL
Anion gap: 11 (ref 5–15)
BUN: 9 mg/dL (ref 6–20)
CALCIUM: 8.7 mg/dL — AB (ref 8.9–10.3)
CO2: 21 mmol/L — AB (ref 22–32)
CREATININE: 0.93 mg/dL (ref 0.44–1.00)
Chloride: 107 mmol/L (ref 101–111)
GFR calc Af Amer: >60 mL/min (ref >60)
GFR calc non Af Amer: >60 mL/min (ref >60)
GLUCOSE: 106 mg/dL — AB (ref 65–99)
Potassium: 4.2 mmol/L (ref 3.5–5.1)
Sodium: 139 mmol/L (ref 135–145)

## 2015-09-07 LAB — MAGNESIUM: MAGNESIUM: 2 mg/dL (ref 1.7–2.4)

## 2015-09-07 LAB — CBC
HCT: 36.4 % (ref 36.0–46.0)
HEMATOCRIT: 30 % — AB (ref 36.0–46.0)
Hemoglobin: 11.9 g/dL — ABNORMAL LOW (ref 12.0–15.0)
Hemoglobin: 9.6 g/dL — ABNORMAL LOW (ref 12.0–15.0)
MCH: 30.4 pg (ref 26.0–34.0)
MCH: 29.8 pg (ref 26.0–34.0)
MCHC: 32.7 g/dL (ref 30.0–36.0)
MCHC: 32 g/dL (ref 30.0–36.0)
MCV: 93.1 fL (ref 78.0–100.0)
MCV: 93.2 fL (ref 78.0–100.0)
PLATELETS: 361 10*3/uL (ref 150–400)
Platelets: 274 10*3/uL (ref 150–400)
RBC: 3.91 MIL/uL (ref 3.87–5.11)
RBC: 3.22 MIL/uL — ABNORMAL LOW (ref 3.87–5.11)
RDW: 15.3 % (ref 11.5–15.5)
RDW: 15.3 % (ref 11.5–15.5)
WBC: 22.5 10*3/uL — ABNORMAL HIGH (ref 4.0–10.5)
WBC: 20.4 10*3/uL — AB (ref 4.0–10.5)

## 2015-09-07 LAB — I-STAT TROPONIN, ED: Troponin i, poc: 0.43 ng/mL (ref 0.00–0.08)

## 2015-09-07 LAB — TSH: TSH: 1.279 u[IU]/mL (ref 0.350–4.500)

## 2015-09-07 LAB — TROPONIN I: Troponin I: 1.11 ng/mL (ref <0.031)

## 2015-09-07 LAB — CREATININE, SERUM
Creatinine, Ser: 0.88 mg/dL (ref 0.44–1.00)
GFR calc non Af Amer: >60 mL/min (ref >60)

## 2015-09-07 LAB — PROTIME-INR
INR: 1.23 (ref 0.00–1.49)
Prothrombin Time: 15.7 seconds — ABNORMAL HIGH (ref 11.6–15.2)

## 2015-09-07 MED ORDER — HEPARIN SODIUM (PORCINE) 5000 UNIT/ML IJ SOLN: 5000.0000 [IU] | Freq: Three times a day (TID) | INTRAMUSCULAR | Status: DC

## 2015-09-07 MED ORDER — ACETAMINOPHEN 325 MG PO TABS: 650.0000 mg | ORAL_TABLET | ORAL | Status: DC | PRN

## 2015-09-07 MED ORDER — ASPIRIN 81 MG PO CHEW
324.0000 mg | CHEWABLE_TABLET | ORAL | Status: AC
  Administered 2015-09-07: 324 mg via ORAL
  Filled 2015-09-07: qty 4

## 2015-09-07 MED ORDER — ONDANSETRON HCL 4 MG/2ML IJ SOLN: 4.0000 mg | Freq: Four times a day (QID) | INTRAMUSCULAR | Status: DC | PRN

## 2015-09-07 MED ORDER — SODIUM CHLORIDE 0.9 % IV SOLN: 250.0000 mL | INTRAVENOUS | Status: DC | PRN

## 2015-09-07 MED ORDER — HEPARIN (PORCINE) IN NACL 100-0.45 UNIT/ML-% IJ SOLN
750.0000 [IU]/h | INTRAMUSCULAR | Status: DC
Start: 2015-09-07 — End: 2015-09-08
  Administered 2015-09-08: 750 [IU]/h via INTRAVENOUS
  Filled 2015-09-07 (×2): qty 250

## 2015-09-07 MED ORDER — LORATADINE 10 MG PO TABS
10.0000 mg | ORAL_TABLET | Freq: Every day | ORAL | Status: DC
  Administered 2015-09-07: 10 mg via ORAL
  Filled 2015-09-07: qty 1

## 2015-09-07 MED ORDER — SODIUM CHLORIDE 0.9 % IV BOLUS (SEPSIS)
1000.0000 mL | Freq: Once | INTRAVENOUS | Status: AC
Start: 2015-09-07 — End: 2015-09-07
  Administered 2015-09-07: 1000 mL via INTRAVENOUS

## 2015-09-07 MED ORDER — SODIUM CHLORIDE 0.9% FLUSH: 3.0000 mL | INTRAVENOUS | Status: DC | PRN

## 2015-09-07 MED ORDER — SODIUM CHLORIDE 0.9 % IV SOLN
INTRAVENOUS | Status: DC
  Administered 2015-09-07 – 2015-09-08 (×2): via INTRAVENOUS

## 2015-09-07 MED ORDER — CARVEDILOL 3.125 MG PO TABS
3.1250 mg | ORAL_TABLET | Freq: Three times a day (TID) | ORAL | Status: DC
  Administered 2015-09-08: 3.125 mg via ORAL
  Filled 2015-09-07 (×3): qty 1

## 2015-09-07 MED ORDER — SODIUM CHLORIDE 0.9% FLUSH
3.0000 mL | Freq: Two times a day (BID) | INTRAVENOUS | Status: DC
  Administered 2015-09-08: 3 mL via INTRAVENOUS

## 2015-09-07 MED ORDER — ASPIRIN 300 MG RE SUPP: 300.0000 mg | RECTAL | Status: AC

## 2015-09-07 MED ORDER — SODIUM CHLORIDE 0.9 % IV BOLUS (SEPSIS)
1000.0000 mL | Freq: Once | INTRAVENOUS | Status: AC
  Administered 2015-09-07: 1000 mL via INTRAVENOUS

## 2015-09-07 MED ORDER — HEPARIN BOLUS VIA INFUSION
3000.0000 [IU] | Freq: Once | INTRAVENOUS | Status: AC
  Administered 2015-09-07: 3000 [IU] via INTRAVENOUS
  Filled 2015-09-07: qty 3000

## 2015-09-07 NOTE — Progress Notes (Signed)
ANTICOAGULATION CONSULT NOTE - Initial Consult  Pharmacy Consult for heparin Indication: chest pain/ACS  No Known Allergies  Patient Measurements: Height: 5\' 5"  (165.1 cm) Weight: 142 lb 6.7 oz (64.6 kg) IBW/kg (Calculated) : 57  Vital Signs: BP: 83/45 mmHg (02/23 2235) Pulse Rate: 58 (02/23 2235)  Labs:  Recent Labs  09/06/15 0909 09/06/15 0910 09/07/15 1650 09/07/15 2141  HGB 11.7  --  11.9* 9.6*  HCT 36.0  --  36.4 30.0*  PLT 307  --  361 274  LABPROT  --   --   --  15.7*  INR  --   --   --  1.23  CREATININE  --  0.8 0.93 0.88  TROPONINI  --   --   --  1.11*    Estimated Creatinine Clearance: 62.7 mL/min (by C-G formula based on Cr of 0.88).   Medical History: Past Medical History  Diagnosis Date  . Breast cancer of upper-outer quadrant of right female breast ([place]) 02/06/2015  . Breast cancer ([place])   . Family history of breast cancer   . Family history of colon cancer   . Family history of ovarian cancer   . Family history of stomach cancer   . Arthritis     bil thumb joints  . Dysrhythmia     fast HR at times due to stress, no chest pain or SOB     Assessment: 60yo female sent to ED for SVT, plan for possible ablation, troponin elevated likely 2/2 demand ischemia/SVT, to begin heparin.  Goal of Therapy:  Heparin level 0.3-0.7 units/ml Monitor platelets by anticoagulation protocol: Yes   Plan:  Will give heparin 3000 units IV bolus x1 followed by gtt at 750 units/hr and monitor heparin levels and CBC.  Wynona Neat, PharmD, BCPS  09/07/2015,10:58 PM

## 2015-09-07 NOTE — Telephone Encounter (Signed)
HR has been flipping back and forth all day.   HR going into 180s, but SBP continues to remain low 80-90s. Notified Dr. Curt Bears, who is in hospital today, and Trish (cardmaster).

## 2015-09-07 NOTE — ED Notes (Signed)
POV from home in SVT since last night with hx of same, BP soft, no CP or SOB,

## 2015-09-07 NOTE — Telephone Encounter (Signed)
Patient tells me that she has been experiencing SVT episodes on & off since 7 pm last night. Explains that it has been going up and down since then. She has been awake since 4am with increased HR. States that she doesn't normally experience it for this long. Reviewed with Camnitz - order to take extra doses of Carvedilol today if BP stable.Marland Kitchen Her BP cuff isn't working.  She needs to find/change batteries.  Patient understands I will call her back shortly to discuss BP.

## 2015-09-07 NOTE — Telephone Encounter (Signed)
Please call asap,heart racing between 160 and 190.

## 2015-09-07 NOTE — ED Provider Notes (Signed)
CSN: [id number]     Arrival date & time 09/07/15  1623 History   First MD Initiated Contact with Patient 09/07/15 1632     Chief Complaint  Patient presents with  . Tachycardia   (Consider location/radiation/quality/duration/timing/severity/associated sxs/prior Treatment) HPI  60 y.o. female with a hx of SVT right-sided breast cancer, presents to the Emergency Department today complaining of palpitations. Pt notes that every month or so she has palpitations with tachycardia ranging in the 180s. Has been going on for several years. Has had stress test, echo, and heart monitor in the past. She can usually valsalva at converts on her own. Has not been to the ED for this complaint before. Recently seen by Cardiology and decided on scheduling ablation. Today, patient unable to convert rhythm on her own with valsalva. Did not take Carvedilol due to hypotension. Having palpitations. No CP/SOB/ABD pain. No headaches. No dizziness. No other symptoms noted.        Past Medical History  Diagnosis Date  . Breast cancer of upper-outer quadrant of right female breast ([place]) 02/06/2015  . Breast cancer ([place])   . Family history of breast cancer   . Family history of colon cancer   . Family history of ovarian cancer   . Family history of stomach cancer   . Arthritis     bil thumb joints  . Dysrhythmia     fast HR at times due to stress, no chest pain or SOB   Past Surgical History  Procedure Laterality Date  . Wisdom tooth extraction    . Radioactive seed guided mastectomy with axillary sentinel lymph node biopsy Right 05/29/2015    Procedure: RADIOACTIVE SEED RIGHT BREAST LUMPECTOMY WITH AXILLARY SENTINEL LYMPH NODE BIOPSY;  Surgeon: Excell Seltzer, MD;  Location: [place];  Service: General;  Laterality: Right;  . Re-excision of breast lumpectomy Right 06/12/2015    Procedure: RE-EXCISION OF RIGHT  BREAST LUMPECTOMY;  Surgeon: Excell Seltzer, MD;  Location: [facility];  Service:  General;  Laterality: Right;  . Portacath placement Right 06/27/2015    Procedure: INSERTION PORT-A-CATH;  Surgeon: Excell Seltzer, MD;  Location: [place];  Service: General;  Laterality: Right;   Family History  Problem Relation Age of Onset  . Breast cancer Sister 51  . Colon cancer Father 64  . Cancer Maternal Grandmother     probable ovarian cancer  . Stomach cancer Paternal Grandmother 22  . Brain cancer Other 10    Medulloblastoma  . Parkinson's disease Mother   . Lung cancer Paternal Uncle     smoker  . Stomach cancer Other     PGMs sister  . Stomach cancer Other     PMGs brother   Social History  Substance Use Topics  . Smoking status: Never Smoker   . Smokeless tobacco: Not on file  . Alcohol Use: Yes     Comment: social   OB History    No data available     Review of Systems ROS reviewed and all are negative for acute change except as noted in the HPI.  Allergies  Review of patient's allergies indicates no known allergies.  Home Medications   Prior to Admission medications   Medication Sig Start Date End Date Taking? Authorizing Provider  carvedilol (COREG) 3.125 MG tablet Take 1 tablet (3.125 mg total) by mouth 2 (two) times daily. Patient taking differently: Take 3.125 mg by mouth 3 (three) times daily.  08/01/15   Jolaine Artist, MD  furosemide (LASIX) 20 MG tablet Take 0.5 tablets (10 mg total) by mouth daily. For 2 days 08/17/15   Will Meredith Leeds, MD  halobetasol (ULTRAVATE) 0.05 % ointment Apply 1 application topically daily as needed (eczema). Reported on 09/06/2015    Historical Provider, MD  ibuprofen (ADVIL,MOTRIN) 200 MG tablet Take 200 mg by mouth. Reported on 09/06/2015    Historical Provider, MD  lidocaine-prilocaine (EMLA) cream Apply to affected area once 06/20/15   Chauncey Cruel, MD  loratadine (CLARITIN) 10 MG tablet Take 10 mg by mouth daily.    Historical Provider, MD  LORazepam (ATIVAN) 0.5 MG tablet Take 1  tablet (0.5 mg total) by mouth at bedtime as needed (Nausea or vomiting). 08/30/15   Heather F Boelter, NP   BP 100/50 mmHg  Pulse 93  Resp 18  SpO2 97%   Physical Exam  Constitutional: She is oriented to person, place, and time. She appears well-developed and well-nourished.  HENT:  Head: Normocephalic and atraumatic.  Eyes: EOM are normal. Pupils are equal, round, and reactive to light.  Neck: Normal range of motion.  Cardiovascular: Regular rhythm, S1 normal, S2 normal, normal heart sounds and intact distal pulses.  Tachycardia present.   Pulmonary/Chest: Effort normal and breath sounds normal.  Abdominal: Soft.  Musculoskeletal: Normal range of motion.  Neurological: She is alert and oriented to person, place, and time.  Skin: Skin is warm and dry.  Psychiatric: She has a normal mood and affect. Her behavior is normal. Thought content normal.  Nursing note and vitals reviewed.  ED Course  Procedures (including critical care time) CRITICAL CARE Performed by: Ozella Rocks   Total critical care time: 40 minutes  Critical care time was exclusive of separately billable procedures and treating other patients.  Critical care was necessary to treat or prevent imminent or life-threatening deterioration.  Critical care was time spent personally by me on the following activities: development of treatment plan with patient and/or surrogate as well as nursing, discussions with consultants, evaluation of patient's response to treatment, examination of patient, obtaining history from patient or surrogate, ordering and performing treatments and interventions, ordering and review of laboratory studies, ordering and review of radiographic studies, pulse oximetry and re-evaluation of patient's condition.  Labs Review Labs Reviewed  CBC - Abnormal; Notable for the following:    WBC 22.5 (*)    Hemoglobin 11.9 (*)    All other components within normal limits  BASIC METABOLIC PANEL - Abnormal;  Notable for the following:    CO2 21 (*)    Glucose, Bld 106 (*)    Calcium 8.7 (*)    All other components within normal limits  I-STAT TROPOININ, ED - Abnormal; Notable for the following:    Troponin i, poc 0.43 (*)    All other components within normal limits   Imaging Review No results found. I have personally reviewed and evaluated these images and lab results as part of my medical decision-making.  EKG Interpretation Date/Time: Thursday September 07 2015 16:28 EST Ventricular Rate: 187 bpm PR Interval:  QRS Duration: 174 ms QTC Calculation: 441 R Axis: 90 Text: Narrow Complex Tachycardia consistent with SVT. Shary Decamp, PA-C   EKG Interpretation   Date/Time:  Thursday September 07 2015 17:55:27 EST Ventricular Rate:  96 PR Interval:  146 QRS Duration: 78 QT Interval:  346 QTC Calculation: 437 R Axis:   82 Text Interpretation:  Sinus rhythm Confirmed by BEATON  MD, ROBERT ([id number])  on 09/07/2015 5:58:37 PM  MDM  I have evaluated and reviewed relevant laboratory values.  I personally reviewed and interpreted the relevant EKG. I have reviewed the relevant previous healthcare records. I obtained HPI from historian. Patient discussed with supervising physician  ED Course:  Assessment: 41y F with hx breast cancer, SVT presents with Palpitations since last night. Initial presentation showed Narrow Complex Tachycardia with pulse in 180s. BP 80/60. Given fluid bolus. Performed Valsalva maneuver and converted to NSR. After conversion, Pulse- 80s. On exam, pt in NAD. Afebrile. Lungs CTA. Heart RRR. Labs show elevated troponin. No CP/SOB. Cardiology to see due to troponin elevation. No hx MI. No hx HTN, HLD, DM, Smoking. Will admit to Cardiology and cycle Troponin. At time of admission, Patient is in no acute distress. Vital Signs are stable.          Disposition/Plan:  Admit to Cardiology Pt acknowledges and agrees with plan  Supervising Physician Leonard Schwartz,  MD   Final diagnoses:  SVT (supraventricular tachycardia) [facility])      Shary Decamp, PA-C 09/07/15 1901  Leonard Schwartz, MD 09/07/15 [phone]

## 2015-09-07 NOTE — ED Notes (Signed)
This RN answered the phone and informed assigned RN for critical trop level 1.11

## 2015-09-07 NOTE — Telephone Encounter (Signed)
Patient advised to continue to monitor and call back if HR increases again for extended period of time. Will not make changes at this time to medication. Patient states that HR is jumping up and down still.  Would like to consider going ahead and switching to different medication She is aware I will follow up with her in the morning and get BP readings from rest of today.  Then discuss with Camnitz in morning. She is agreeable to this plan.

## 2015-09-07 NOTE — Telephone Encounter (Signed)
Reports BP 85-90/55-60. Explained that this is too low to increase medication. Reports HR currently 90. She understands I will re-review with Camnitz and call her back with updated recommendation/s.

## 2015-09-07 NOTE — Consult Note (Signed)
CARDIOLOGY CONSULT NOTE   Patient ID: Catherine Lawson MRN: [id number], DOB/AGE: Jun 12, 1956   Admit date: 09/07/2015 Date of Consult: 09/07/2015  Primary Physician: Cari Caraway, MD Primary Cardiologist: None  Reason for consult:  SVT, elevated troponin  Problem List  Past Medical History  Diagnosis Date  . Breast cancer of upper-outer quadrant of right female breast ([place]) 02/06/2015  . Breast cancer ([place])   . Family history of breast cancer   . Family history of colon cancer   . Family history of ovarian cancer   . Family history of stomach cancer   . Arthritis     bil thumb joints  . Dysrhythmia     fast HR at times due to stress, no chest pain or SOB    Past Surgical History  Procedure Laterality Date  . Wisdom tooth extraction    . Radioactive seed guided mastectomy with axillary sentinel lymph node biopsy Right 05/29/2015    Procedure: RADIOACTIVE SEED RIGHT BREAST LUMPECTOMY WITH AXILLARY SENTINEL LYMPH NODE BIOPSY;  Surgeon: Excell Seltzer, MD;  Location: [facility];  Service: General;  Laterality: Right;  . Re-excision of breast lumpectomy Right 06/12/2015    Procedure: RE-EXCISION OF RIGHT  BREAST LUMPECTOMY;  Surgeon: Excell Seltzer, MD;  Location: [facility];  Service: General;  Laterality: Right;  . Portacath placement Right 06/27/2015    Procedure: INSERTION PORT-A-CATH;  Surgeon: Excell Seltzer, MD;  Location: [facility];  Service: General;  Laterality: Right;     Social History: The patient  reports that she has never smoked. She does not have any smokeless tobacco history on file. She reports that she drinks alcohol. She reports that she does not use illicit drugs.   Family History: The patient's family history includes Brain cancer (age of onset: 60) in her other; Breast cancer (age of onset: 67) in her sister; Cancer in her maternal grandmother; Colon cancer (age of onset: 59) in her father; Lung cancer in her paternal  uncle; Parkinson's disease in her mother; Stomach cancer in her other and other; Stomach cancer (age of onset: 10) in her paternal grandmother.   Allergies  No Known Allergies  HPI   Catherine Lawson is a 60 y.o. female who is being followed by Dr. Curt Bears in the EP clinic for SVT most probably AVNRT. This is a very pleasant patient who has a history of right-sided breast cancer diagnosed 02/02/15. She has a history of palpitations occurring for the last 20 years. She was evaluated 20 years ago with a stress test, echo, and heart monitor. She had not had episodes while wearing the monitor. Her last episode was 12/26, where she had a heart rate of 190. The episode lasted for 3 hours and was associated with mild dizziness. The episodes often break with Valsalva. She tends to have the monthly with occasional shorter episodes. She feels that stress is a trigger and has cut back on her coffee intake. She had a discussion with Dr. Curt Bears, and decided to proceed with ablation once she is finished with her breast cancer treatment.  She presented today after almost 12 hour of being SVT with heart rate of 1 90 bpm there was associated with shortness of breath and dizziness. The patient underwent her last chemotherapy last Wednesday and was only able to eat and drink minimally in the last week.  On presentation her heart rate was 1 90 bpm, EKG showed SVT that resolved with Valsalva maneuvers. Her blood pressure was 85/50  up on presentation she was started on IV fluids. She currently denies any chest pain, shortness of breath or dizziness.  Inpatient Medications   Family History Family History  Problem Relation Age of Onset  . Breast cancer Sister 65  . Colon cancer Father 65  . Cancer Maternal Grandmother     probable ovarian cancer  . Stomach cancer Paternal Grandmother 21  . Brain cancer Other 10    Medulloblastoma  . Parkinson's disease Mother   . Lung cancer Paternal Uncle     smoker  .  Stomach cancer Other     PGMs sister  . Stomach cancer Other     PMGs brother    Social History Social History   Social History  . Marital Status: Married    Spouse Name: Event organiser  . Number of Children: 0  . Years of Education: N/A   Occupational History  . Not on file.   Social History Main Topics  . Smoking status: Never Smoker   . Smokeless tobacco: Not on file  . Alcohol Use: Yes     Comment: social  . Drug Use: No  . Sexual Activity: Yes   Other Topics Concern  . Not on file   Social History Narrative    Review of Systems  General:  No chills, fever, night sweats or weight changes.  Cardiovascular:  No chest pain, dyspnea on exertion, edema, orthopnea, palpitations, paroxysmal nocturnal dyspnea. Dermatological: No rash, lesions/masses Respiratory: No cough, dyspnea Urologic: No hematuria, dysuria Abdominal:   No nausea, vomiting, diarrhea, bright red blood per rectum, melena, or hematemesis Neurologic:  No visual changes, wkns, changes in mental status. All other systems reviewed and are otherwise negative except as noted above.  Physical Exam  Blood pressure 91/55, pulse 90, resp. rate 16, SpO2 99 %.  General: Pleasant, NAD Psych: Normal affect. Neuro: Alert and oriented X 3. Moves all extremities spontaneously. HEENT: Normal  Neck: Supple without bruits or JVD. Lungs:  Resp regular and unlabored, CTA. Heart: RRR no s3, s4, or murmurs. Abdomen: Soft, non-tender, non-distended, BS + x 4.  Extremities: No clubbing, cyanosis or edema. DP/PT/Radials 2+ and equal bilaterally.  Labs  No results for input(s): CKTOTAL, CKMB, TROPONINI in the last 72 hours. Lab Results  Component Value Date   WBC 22.5* 09/07/2015   HGB 11.9* 09/07/2015   HCT 36.4 09/07/2015   MCV 93.1 09/07/2015   PLT 361 09/07/2015    Recent Labs Lab 09/06/15 0910 09/07/15 1650  NA 142 139  K 4.7 4.2  CL  --  107  CO2 24 21*  BUN 11.3 9  CREATININE 0.8 0.93  CALCIUM 8.9 8.7*    PROT 6.3*  --   BILITOT 0.52  --   ALKPHOS 149  --   ALT 34  --   AST 17  --   GLUCOSE 79 106*   No results found for: CHOL, HDL, LDLCALC, TRIG No results found for: DDIMER Invalid input(s): POCBNP  Radiology/Studies  No results found.  Echocardiogram - 07/11/2015 Study Conclusions  - Left ventricle: The cavity size was normal. Wall thickness was normal. Systolic function was normal. The estimated ejection fraction was in the range of 60% to 65%. Wall motion was normal; there were no regional wall motion abnormalities. Left ventricular diastolic function parameters were normal.  Impressions: - Normal global LV longitudinal strain -19%  ECG: SR, normal ECG    ASSESSMENT AND PLAN  1. Palpitations: Had SVT on her monitor, which appears  to be AVNRT.  She has frequent episodes now, we are unable to up titrate beta blockers if she is hypotensive. She'll have discussion with EP team tomorrow for possible ablation earlier. If she decides to wait until after her radiation therapy is completed we will consider starting on low-dose flecainide 50 mg by mouth twice a day.   2. Hypotension - secondary to dehydration, we will continue IV fluids overnight.   3. Troponin elevation - demand ischemia secondary to SVT, we'll cycle to see the down trend.   Signed, Dorothy Spark, MD, [facility] 09/07/2015, 5:59 PM

## 2015-09-07 NOTE — Telephone Encounter (Signed)
F/u  Pt requested to speak w/ rN - stated that she is heading to the ED. Please call back and discuss.

## 2015-09-07 NOTE — ED Notes (Signed)
MD made aware of patient Troponin of 0.43

## 2015-09-08 ENCOUNTER — Other Ambulatory Visit: Payer: Self-pay

## 2015-09-08 ENCOUNTER — Encounter (HOSPITAL_COMMUNITY): Payer: Self-pay | Admitting: *Deleted

## 2015-09-08 DIAGNOSIS — I471 Supraventricular tachycardia: Secondary | ICD-10-CM | POA: Diagnosis not present

## 2015-09-08 LAB — COMPREHENSIVE METABOLIC PANEL
ALK PHOS: 112 U/L (ref 38–126)
ALT: 24 U/L (ref 14–54)
ANION GAP: 7 (ref 5–15)
AST: 20 U/L (ref 15–41)
Albumin: 2.7 g/dL — ABNORMAL LOW (ref 3.5–5.0)
BUN: 8 mg/dL (ref 6–20)
CALCIUM: 7.7 mg/dL — AB (ref 8.9–10.3)
CO2: 21 mmol/L — ABNORMAL LOW (ref 22–32)
CREATININE: 0.86 mg/dL (ref 0.44–1.00)
Chloride: 114 mmol/L — ABNORMAL HIGH (ref 101–111)
Glucose, Bld: 104 mg/dL — ABNORMAL HIGH (ref 65–99)
Potassium: 3.8 mmol/L (ref 3.5–5.1)
Sodium: 142 mmol/L (ref 135–145)
Total Bilirubin: 0.6 mg/dL (ref 0.3–1.2)
Total Protein: 5 g/dL — ABNORMAL LOW (ref 6.5–8.1)

## 2015-09-08 LAB — CBC
HCT: 33 % — ABNORMAL LOW (ref 36.0–46.0)
HEMATOCRIT: 31.7 % — AB (ref 36.0–46.0)
HEMOGLOBIN: 10.1 g/dL — AB (ref 12.0–15.0)
Hemoglobin: 10.9 g/dL — ABNORMAL LOW (ref 12.0–15.0)
MCH: 29.7 pg (ref 26.0–34.0)
MCH: 31.3 pg (ref 26.0–34.0)
MCHC: 31.9 g/dL (ref 30.0–36.0)
MCHC: 33 g/dL (ref 30.0–36.0)
MCV: 93.2 fL (ref 78.0–100.0)
MCV: 94.8 fL (ref 78.0–100.0)
Platelets: 245 10*3/uL (ref 150–400)
Platelets: 261 10*3/uL (ref 150–400)
RBC: 3.4 MIL/uL — AB (ref 3.87–5.11)
RBC: 3.48 MIL/uL — ABNORMAL LOW (ref 3.87–5.11)
RDW: 15.3 % (ref 11.5–15.5)
RDW: 15.5 % (ref 11.5–15.5)
WBC: 18.3 10*3/uL — AB (ref 4.0–10.5)
WBC: 21.3 10*3/uL — ABNORMAL HIGH (ref 4.0–10.5)

## 2015-09-08 LAB — PROTIME-INR
INR: 1.34 (ref 0.00–1.49)
Prothrombin Time: 16.7 seconds — ABNORMAL HIGH (ref 11.6–15.2)

## 2015-09-08 LAB — LIPID PANEL
CHOLESTEROL: 97 mg/dL (ref 0–200)
HDL: 31 mg/dL — AB (ref >40)
LDL Cholesterol: 58 mg/dL (ref 0–99)
TRIGLYCERIDES: 40 mg/dL (ref <150)
Total CHOL/HDL Ratio: 3.1 RATIO
VLDL: 8 mg/dL (ref 0–40)

## 2015-09-08 LAB — TROPONIN I
Troponin I: 0.61 ng/mL (ref <0.031)
Troponin I: 0.45 ng/mL — ABNORMAL HIGH (ref <0.031)

## 2015-09-08 LAB — HEPARIN LEVEL (UNFRACTIONATED): Heparin Unfractionated: 0.22 IU/mL — ABNORMAL LOW (ref 0.30–0.70)

## 2015-09-08 MED ORDER — METOPROLOL TARTRATE 25 MG PO TABS: 12.5000 mg | ORAL_TABLET | Freq: Two times a day (BID) | ORAL | Status: DC

## 2015-09-08 MED ORDER — FLECAINIDE ACETATE 50 MG PO TABS
50.0000 mg | ORAL_TABLET | Freq: Two times a day (BID) | ORAL | Status: DC
  Administered 2015-09-08: 50 mg via ORAL
  Filled 2015-09-08: qty 1

## 2015-09-08 MED ORDER — FLECAINIDE ACETATE 50 MG PO TABS: 50.0000 mg | ORAL_TABLET | Freq: Two times a day (BID) | ORAL | Status: DC

## 2015-09-08 NOTE — Discharge Instructions (Signed)
Electrophysiology Study  An electrophysiology (EP) study is an invasive heart test done through catheters placed in a large vein in your groin, arm, neck, or chest. It is done to evaluate the electrical conduction system of your heart. An EP study is done if other heart tests have not found or fully explained the cause of symptoms such as:   Dizziness or fainting.   A fast heartbeat (tachycardia).   A slow heartbeat (bradycardia).  If the cause of your symptoms is found during the EP study, your health care provider will discuss treatment options. Some treatment options that may be done to correct your symptoms include:   Cardiac ablation. Cardiac ablation destroys a small area of heart tissue that may be causing tachycardia.   Implantable cardioverter defibrillator (ICD). An ICD can detect a fast or abnormal heart rate. When an abnormal rhythm is detected, the ICD shocks the heart to restore it to a normal heart rhythm.  LET YOUR HEALTH CARE PROVIDER KNOW ABOUT:    Any allergies you have.   All medicines you are taking, including vitamins, herbs, eye drops, creams, and over-the-counter medicines.   Previous problems you or members of your family have had with the use of anesthetics.   Any blood disorders you have.   Previous surgeries you have had.   Medical conditions you have.  RISKS AND COMPLICATIONS   Generally, this is a safe procedure. However, as with any procedure, problems can occur. Possible problems include:   Tachycardia that does not go away. This may require shocking your heart (cardioversion).   Bleeding or bruising from the catheter insertion sites.   Infection at the catheter insertion sites.   Temporary or permanent heart rhythm abnormalities.   Temporary changes in blood pressure.   Puncture (perforation) of the heart wall. This can cause bleeding between the heart and the sac that surrounds it (cardiac tamponade). This is a life-threatening condition that may require heart  surgery.   Possible cardiac arrest or fatal heart arrhythmia.  BEFORE THE PROCEDURE   Do not eat or drink before the EP study as instructed by your health care provider.   Be sure to urinate before the EP study.   If you are going home after the EP study, someone will need to drive you home and stay with you for 24 hours.  PROCEDURE  The EP study is performed in a catheterization laboratory. This is a room set up to do heart procedures.    You will be given medicine through an intravenous (IV) access to reduce discomfort and help you relax.   The area where the catheter will be inserted will be shaved as needed and cleansed. Sterile drapes will cover you. This will keep the area sterile.   Thin, flexible tubes (catheters) with an electrode tip will be inserted into a large vein. From there, the catheters are guided to the heart using a type of X-ray machine (fluoroscopy). Once in the heart, the catheters evaluate the electrical activity of your heart.   If you are awake during the EP study, you may feel dizzy or light-headed. Your heart rate may temporarily increase or you may feel your heart beating hard. Tell your health care provider if you feel dizzy, nauseated, or have chest pain or pressure during the EP study.   When the EP study is done, the catheters are removed.   Firm pressure is applied to the insertion sites. This is done to prevent bleeding.  AFTER THE PROCEDURE     You will need to lie flat for a few hours or as told by your health care provider. You will need to keep your legs straight. Do not bend or cross your legs. This is done so the clot at the insertion does not break loose and cause bleeding.   If you took blood thinners before the EP study, ask your health care provider when you can start taking them again.     This information is not intended to replace advice given to you by your health care provider. Make sure you discuss any questions you have with your health care provider.      Document Released: 12/19/2009 Document Revised: 07/06/2013 Document Reviewed: 01/13/2013  Elsevier Interactive Patient Education 2016 [employer]  Cardiac Ablation  Cardiac ablation is a procedure to disable a small amount of heart tissue in very specific places. The heart has many electrical connections. Sometimes these connections are abnormal and can cause the heart to beat very fast or irregularly. By disabling some of the problem areas, heart rhythm can be improved or made normal. Ablation is done for people who:    Have Wolff-Parkinson-White syndrome.    Have other fast heart rhythms (tachycardia).    Have taken medicines for an abnormal heart rhythm (arrhythmia) that resulted in:    No success.    Side effects.    May have a high-risk heartbeat that could result in death.   LET YOUR HEALTH CARE PROVIDER KNOW ABOUT:    Any allergies you have or any previous reactions you have had to X-ray dye, food (such as seafood), medicine, or tape.    All medicines you are taking, including vitamins, herbs, eye drops, creams, and over-the-counter medicines.    Previous problems you or members of your family have had with the use of anesthetics.    Any blood disorders you have.    Previous surgeries or procedures (such as a kidney transplant) you have had.    Medical conditions you have (such as kidney failure).   RISKS AND COMPLICATIONS  Generally, cardiac ablation is a safe procedure. However, problems can occur and include:    Increased risk of cancer. Depending on how long it takes to do the ablation, the dose of radiation can be high.   Bruising and bleeding where a thin, flexible tube (catheter) was inserted during the procedure.    Bleeding into the chest, especially into the sac that surrounds the heart (serious).   Need for a permanent pacemaker if the normal electrical system is damaged.    The procedure may not be fully effective, and this may not be recognized for months.  Repeat ablation procedures are sometimes required.  BEFORE THE PROCEDURE    Follow any instructions from your health care provider regarding eating and drinking before the procedure.    Take your medicines as directed at regular times with water, unless instructed otherwise by your health care provider. If you are taking diabetes medicine, including insulin, ask how you are to take it and if there are any special instructions you should follow. It is common to adjust insulin dosing the day of the ablation.   PROCEDURE   An ablation is usually performed in a catheterization laboratory with the guidance of fluoroscopy. Fluoroscopy is a type of X-ray that helps your health care provider see images of your heart during the procedure.    An ablation is a minimally invasive procedure. This means a small cut (incision) is made in either your   neck or groin. Your health care provider will decide where to make the incision based on your medical history and physical exam.   An IV tube will be started before the procedure begins. You will be given an anesthetic or medicine to help you relax (sedative).   The skin on your neck or groin will be numbed. A needle will be inserted into a large vein in your neck or groin and catheters will be threaded to your heart.   A special dye that shows up on fluoroscopy pictures may be injected through the catheter. The dye helps your health care provider see the area of the heart that needs treatment.   The catheter has electrodes on the tip. When the area of heart tissue that is causing the arrhythmia is found, the catheter tip will send an electrical current to the area and "scar" the tissue. Three types of energy can be used to ablate the heart tissue:    Heat (radiofrequency energy).    Laser energy.    Extreme cold (cryoablation).    When the area of the heart has been ablated, the catheter will be taken out. Pressure will be held on the insertion site. This will help  the insertion site clot and keep it from bleeding. A bandage will be placed on the insertion site.   AFTER THE PROCEDURE    After the procedure, you will be taken to a recovery area where your vital signs (blood pressure, heart rate, and breathing) will be monitored. The insertion site will also be monitored for bleeding.    You will need to lie still for 4-6 hours. This is to ensure you do not bleed from the catheter insertion site.      This information is not intended to replace advice given to you by your health care provider. Make sure you discuss any questions you have with your health care provider.     Document Released: 11/17/2008 Document Revised: 07/22/2014 Document Reviewed: 11/23/2012  Elsevier Interactive Patient Education 2016 [employer]  Paroxysmal Supraventricular Tachycardia  Paroxysmal supraventricular tachycardia (PSVT) is a type of abnormal heart rhythm. It causes your heart to beat very quickly and then suddenly stop beating so quickly. A normal heart rate is 60-100 beats per minute. During an episode of PSVT, your heart rate may be 150-250 beats per minute. This can make you feel light-headed and short of breath. An episode of PSVT can be frightening. It is usually not dangerous.  The heart has four chambers. All chambers need to work together for the heart to beat effectively. A normal heartbeat usually starts in the right upper chamber of the heart (atrium) when an area (sinoatrial node) puts out an electrical signal that spreads to the other chambers. People with PSVT may have abnormal electrical pathways, or they may have other areas in the upper chambers that send out electrical signals. The result is a very rapid heartbeat.  When your heart beats very quickly, it does not have time to fill completely with blood. When PSVT happens often or it lasts for long periods, it can lead to heart weakness and failure. Most people with PSVT do not have any other heart disease.  CAUSES   Abnormal electrical activity in the heart causes PSVT. It is not known why some people get PSVT and others do not.  RISK FACTORS  You may be more likely to have PSVT if:   You are 20-30 years old.   You are a woman.    Other factors that may increase your chances of an attack include:   Stress.   Being tired.   Smoking.   Stimulant drugs.   Alcoholic drinks.   Caffeine.   Pregnancy.  SIGNS AND SYMPTOMS  A mild episode of PSVT may cause no symptoms. If you do have signs and symptoms, they may include:   A pounding heart.   Feeling of skipped heartbeats (palpitations).   Weakness.   Shortness of breath.   Tightness or pain in your chest.   Light-headedness.   Anxiety.   Dizziness.   Sweating.   Nausea.   A fainting spell.  DIAGNOSIS  Your health care provider may suspect PSVT if you have symptoms that come and go. The health care provider will do a physical exam. If you are having an episode during the exam, the health care provider may be able to diagnose PSVT by listening to your heart and feeling your pulse. Tests may also be done, including:   An electrical study of your heart (electrocardiogram, or ECG).   A test in which you wear a portable ECG monitor all day (Holter monitor) or for several days (event monitor).   A test that involves taking an image of your heart using sound waves (echocardiogram) to rule out other causes of a fast heart rate.  TREATMENT  You may not need treatment if episodes of PSVT do not happen often or if they do not cause symptoms. If PSVT episodes do cause symptoms, your health care provider may first suggest trying a self-treatment called vagus nerve stimulation. The vagus nerve extends down from the brain. It regulates certain body functions. Stimulating this nerve can slow down the heart. Your health care provider can teach you ways to do this. You may need to try a few ways to find what works best for you. Options include:   Holding your breath and pushing, as  though you are having a bowel movement.   Massaging an area on one side of your neck below your jaw.   Bending forward with your head between your legs.   Bending forward with your head between your legs and coughing.   Massaging your eyeballs with your eyes closed.  If vagus nerve stimulation does not work, other treatment options include:   Medicines to prevent an attack.   Being treated in the hospital with medicine or electric shock to stop an attack (cardioversion). This treatment can include:    Getting medicine through an IV line.    Having a small electric shock delivered to your heart. You will be given medicine to make you sleep through this procedure.   If you have frequent episodes with symptoms, you may need a procedure to get rid of the faulty areas of your heart (radiofrequency ablation) and end the episodes of PSVT. In this procedure:    A long, thin tube (catheter) is passed through one of your veins into your heart.    Energy directed through the catheter eliminates the areas of your heart that are causing abnormal electric stimulation.  HOME CARE INSTRUCTIONS   Take medicines only as directed by your health care provider.   Do not use caffeine in any form if caffeine triggers episodes of PSVT. Otherwise, consume caffeine in moderation. This means no more than a few cups of coffee or the equivalent each day.   Do not drink alcohol if alcohol triggers episodes of PSVT. Otherwise, limit alcohol intake to no more than 1 drink per   day for nonpregnant women and 2 drinks per day for men. One drink equals 12 ounces of beer, 5 ounces of wine, or 1 ounces of hard liquor.   Do not use any tobacco products, including cigarettes, chewing tobacco, or electronic cigarettes. If you need help quitting, ask your health care provider.   Try to get at least 7 hours of sleep each night.   Find healthy ways to manage stress.   Perform vagus nerve stimulation as directed by your health care provider.    Maintain a healthy weight.   Get some exercise on most days. Ask your health care provider to suggest some good activities for you.  SEEK MEDICAL CARE IF:   You are having episodes of PSVT more often, or they are lasting longer.   Vagus nerve stimulation is no longer helping.   You have new symptoms during an episode.  SEEK IMMEDIATE MEDICAL CARE IF:   You have chest pain or trouble breathing.   You have an episode of PSVT that has lasted longer than 20 minutes.   You have passed out from an episode of PSVT.  These symptoms may represent a serious problem that is an emergency. Do not wait to see if the symptoms will go away. Get medical help right away. Call your local emergency services (911 in the [place] Do not drive yourself to the hospital.     This information is not intended to replace advice given to you by your health care provider. Make sure you discuss any questions you have with your health care provider.     Document Released: 07/01/2005 Document Revised: 07/22/2014 Document Reviewed: 12/09/2013  Elsevier Interactive Patient Education 2016 [employer]

## 2015-09-08 NOTE — Progress Notes (Signed)
Reviewed discharge instructions with patient and she stated her understanding.  Patient preferred to walk out with husband, husband stated that was fine.  Discharged home with husband. Sanda Linger

## 2015-09-08 NOTE — Plan of Care (Signed)
Problem: Education: Goal: Knowledge of [facility] Education information/materials will improve Outcome: Completed/Met Date Met:  09/08/15 Reviewed new medications, Metoprolol and flecainide, with patient and provided with educational handouts.  Discussed monitoring for dizziness and notifying physician for these symptoms.

## 2015-09-08 NOTE — Discharge Summary (Signed)
ELECTROPHYSIOLOGY PROCEDURE DISCHARGE SUMMARY    Patient ID: TAMMYJO HODSON,  MRN: [id number], DOB/AGE: 59-Aug-1957 59 y.o.  Admit date: 09/07/2015 Discharge date: 09/08/2015  Primary Care Physician: Cari Caraway, MD Primary Cardiologist: [place] Electrophysiologist: [facility]  Primary Discharge Diagnosis:  Active Problems:   SVT (supraventricular tachycardia) ([place])   No Known Allergies  Brief HPI/Hospital Course:  VANETA BARBE is a 60 y.o. female with a past medical history significant for breast cancer, and SVT. She has been seen and evaluated for SVT ablation but preferred to wait until after breast cancer treatments are done.  She presented to the ER with recurrent SVT that she was unable to terminate with Valsalva. She was admitted for observation. Troponins were elevated, but felt to be due to demand ischemia.  She was monitored overnight on telemetry which demonstrated SR. She was seen by Dr Curt Bears this morning who recommended low dose Flecanide to help maintain SR until ablation can be done. She was considered stable for discharge to home. She will have EKG in 1 week and follow up with Dr Curt Bears in April as scheduled.   Physical Exam: Filed Vitals:   09/07/15 2327 09/08/15 0600 09/08/15 0619 09/08/15 0749  BP: 93/44  96/55 102/57  Pulse: 85  81 86  Temp: 98.7 F (37.1 C)  98.4 F (36.9 C) 99.1 F (37.3 C)  TempSrc: Oral  Oral Oral  Resp: 18  18   Height: 5\' 5"  (1.651 m)     Weight: 146 lb 14.4 oz (66.633 kg) 149 lb (67.586 kg)    SpO2: 99%  100% 97%    GEN- The patient is thin appearing, alert and oriented x 3 today.   HEENT: normocephalic, atraumatic; sclera clear, conjunctiva pink; hearing intact; oropharynx clear; neck supple Lungs- Clear to ausculation bilaterally, normal work of breathing.  No wheezes, rales, rhonchi Heart- Regular rate and rhythm  GI- soft, non-tender, non-distended, bowel sounds present Extremities- no clubbing, cyanosis, or edema   MS- no significant deformity or atrophy Skin- warm and dry, no rash or lesion Psych- euthymic mood, full affect Neuro- strength and sensation are intact  Labs:   Lab Results  Component Value Date   WBC 21.3* 09/08/2015   HGB 10.9* 09/08/2015   HCT 33.0* 09/08/2015   MCV 94.8 09/08/2015   PLT 261 09/08/2015     Recent Labs Lab 09/08/15 0226  NA 142  K 3.8  CL 114*  CO2 21*  BUN 8  CREATININE 0.86  CALCIUM 7.7*  PROT 5.0*  BILITOT 0.6  ALKPHOS 112  ALT 24  AST 20  GLUCOSE 104*     Discharge Medications:  Current Discharge Medication List    START taking these medications   Details  flecainide (TAMBOCOR) 50 MG tablet Take 1 tablet (50 mg total) by mouth 2 (two) times daily. Qty: 60 tablet, Refills: 1      CONTINUE these medications which have NOT CHANGED   Details  carvedilol (COREG) 3.125 MG tablet Take 1 tablet (3.125 mg total) by mouth 2 (two) times daily. Qty: 60 tablet, Refills: 4    lidocaine-prilocaine (EMLA) cream Apply to affected area once Qty: 30 g, Refills: 3   Associated Diagnoses: Breast cancer of upper-outer quadrant of right female breast (H[facility])    loratadine (CLARITIN) 10 MG tablet Take 10 mg by mouth daily.    furosemide (LASIX) 20 MG tablet Take 0.5 tablets (10 mg total) by mouth daily. For 2 days Qty: 10 tablet, Refills: 0  LORazepam (ATIVAN) 0.5 MG tablet Take 1 tablet (0.5 mg total) by mouth at bedtime as needed (Nausea or vomiting). Qty: 30 tablet, Refills: 0   Associated Diagnoses: Breast cancer of upper-outer quadrant of right female breast ([facility])        Disposition: Pt is being discharged home today in good condition. Discharge Instructions    Diet - low sodium heart healthy    Complete by:  As directed      Increase activity slowly    Complete by:  As directed           Follow-up Information    Follow up with Will Meredith Leeds, MD On 10/17/2015.   Specialty:  Cardiology   Why:  at 9:30AM   Contact information:    [place] [address] [phone]       Follow up with [facility] In 1 week.   Specialty:  Cardiology   Why:  for EKG - office will call to make appointment    Contact information:   [address], [place] [address] [phone]      Duration of Discharge Encounter: Greater than 30 minutes including physician time.  Signed, Chanetta Marshall, NP 09/08/2015 8:09 AM   I have seen and examined this patient with Chanetta Marshall.  Agree with above, note added to reflect my findings.  On exam, regular rhythm, no murmurs, lungs clear.  Presented to the hospital with incessant tachycardia.  Was given IV fluids in the ER and converted with valsalva.  No further tachycardia overnight.  Will plan on discharge today on low dose flecainide and switch coreg to metoprolol to hopefully have less BP effect.  Will have her come to clinic to have an ECG in one week.  Will M. Camnitz MD 09/08/2015 8:29 AM

## 2015-09-09 LAB — HEMOGLOBIN A1C
Hgb A1c MFr Bld: 6.9 % — ABNORMAL HIGH (ref 4.8–5.6)
Mean Plasma Glucose: 151 mg/dL

## 2015-09-11 ENCOUNTER — Other Ambulatory Visit: Payer: Self-pay | Admitting: Nurse Practitioner

## 2015-09-11 ENCOUNTER — Telehealth: Payer: Self-pay | Admitting: Cardiology

## 2015-09-11 ENCOUNTER — Telehealth: Payer: Self-pay | Admitting: *Deleted

## 2015-09-11 NOTE — Telephone Encounter (Signed)
New message      Returning a call to Dr [employer] nurse

## 2015-09-11 NOTE — Telephone Encounter (Signed)
Pt called b/c she is concerned that her A1c was elevated according to her cardiologist, pt does not know what her number is. Dr. had told pt that  her PCP may want to start her on an oral med to control blood sugars. Pt is concerned about this and does not want to start on anything until she understands why. Pt asked the question,"Could the chemo have caused this? Pt is not prediabetic, and all her blood sugars were WNL with every lab. Pt completed chemo 2 weeks ago. Pt will have cardiologist fax a copy of A1c this evening to [place]. In the meantime I will call pt back and tell her to make an appt with Dr. Addison Lank to f/u with these results. Message to be fwd to [employer].

## 2015-09-12 ENCOUNTER — Telehealth: Payer: Self-pay | Admitting: Cardiology

## 2015-09-12 NOTE — Telephone Encounter (Signed)
Patient needs to see PCP and he can only see her Thursday at [facility]. Informed patient this is fine, will move EKG back to 10am and she can come by when done at PCPs office. Patient verbalized understanding and agreeable to plan.

## 2015-09-12 NOTE — Telephone Encounter (Signed)
Patient states she is doing very well since starting Flecainide.  Explains that she has been able to exercise the past few days and get back to "normal activities".  She was supposed to have an EKG 1 week post d/c on 2/24 but no one has contacted her to arrange this. Scheduled patient to come by office on Thursday 3/2 at 8:30 a.m. for EKG, post Flecainide start. Patient verbalized understanding and agreeable to plan.

## 2015-09-12 NOTE — Telephone Encounter (Signed)
New message   Pt calling to change EKG time

## 2015-09-14 ENCOUNTER — Ambulatory Visit (INDEPENDENT_AMBULATORY_CARE_PROVIDER_SITE_OTHER): Payer: Managed Care, Other (non HMO) | Admitting: *Deleted

## 2015-09-14 DIAGNOSIS — I471 Supraventricular tachycardia: Secondary | ICD-10-CM

## 2015-09-14 NOTE — Patient Instructions (Signed)
Your heart is in normal sinus rhythm today! I will discuss Metoprolol with Dr. Curt Bears and let you know his recommendation.

## 2015-09-14 NOTE — Progress Notes (Signed)
Per Dr. Curt Bears - patient to come by office for EKG following Flecainide initiation on 2/24. EKG performed and reviewed with Dr. Lovena Le, DOD - no orders received. EKG showing NSR, QRS 88 ms.  Patient tells me she is not taking her beta blocker (Metoprolol) because BPs have been low. Will review this with Dr. Curt Bears and let her know his recommendation/s.

## 2015-09-18 ENCOUNTER — Ambulatory Visit
Admission: RE | Admit: 2015-09-18 | Discharge: 2015-09-18 | Disposition: A | Discharge status: Home / Self Care | Payer: Managed Care, Other (non HMO) | Source: Ambulatory Visit | Attending: Radiation Oncology | Admitting: Radiation Oncology

## 2015-09-18 DIAGNOSIS — Z170 Estrogen receptor positive status [ER+]: Secondary | ICD-10-CM | POA: Diagnosis not present

## 2015-09-18 DIAGNOSIS — Z800 Family history of malignant neoplasm of digestive organs: Secondary | ICD-10-CM | POA: Insufficient documentation

## 2015-09-18 DIAGNOSIS — Z803 Family history of malignant neoplasm of breast: Secondary | ICD-10-CM | POA: Insufficient documentation

## 2015-09-18 DIAGNOSIS — C50411 Malignant neoplasm of upper-outer quadrant of right female breast: Secondary | ICD-10-CM | POA: Insufficient documentation

## 2015-09-18 NOTE — Progress Notes (Signed)
Dr. Curt Bears made aware of stopped Carvedilol. No orders received.  Pt can remain off the med at this time.

## 2015-09-19 ENCOUNTER — Ambulatory Visit: Payer: Managed Care, Other (non HMO)

## 2015-09-19 ENCOUNTER — Other Ambulatory Visit (HOSPITAL_BASED_OUTPATIENT_CLINIC_OR_DEPARTMENT_OTHER): Payer: Managed Care, Other (non HMO)

## 2015-09-19 ENCOUNTER — Ambulatory Visit (HOSPITAL_BASED_OUTPATIENT_CLINIC_OR_DEPARTMENT_OTHER): Payer: Managed Care, Other (non HMO)

## 2015-09-19 VITALS — BP 114/53 | HR 72 | Temp 98.4°F | Resp 18

## 2015-09-19 DIAGNOSIS — Z5112 Encounter for antineoplastic immunotherapy: Secondary | ICD-10-CM | POA: Diagnosis not present

## 2015-09-19 DIAGNOSIS — C50411 Malignant neoplasm of upper-outer quadrant of right female breast: Secondary | ICD-10-CM | POA: Diagnosis not present

## 2015-09-19 DIAGNOSIS — Z95828 Presence of other vascular implants and grafts: Secondary | ICD-10-CM

## 2015-09-19 DIAGNOSIS — C50912 Malignant neoplasm of unspecified site of left female breast: Secondary | ICD-10-CM

## 2015-09-19 LAB — CBC WITH DIFFERENTIAL/PLATELET
BASO%: 1.9 % (ref 0.0–2.0)
BASOS ABS: 0.1 10*3/uL (ref 0.0–0.1)
EOS ABS: 0.2 10*3/uL (ref 0.0–0.5)
EOS%: 3.9 % (ref 0.0–7.0)
HEMATOCRIT: 34.2 % — AB (ref 34.8–46.6)
HGB: 11.3 g/dL — ABNORMAL LOW (ref 11.6–15.9)
LYMPH%: 17.9 % (ref 14.0–49.7)
MCH: 29.9 pg (ref 25.1–34.0)
MCHC: 33 g/dL (ref 31.5–36.0)
MCV: 90.6 fL (ref 79.5–101.0)
MONO#: 0.6 10*3/uL (ref 0.1–0.9)
MONO%: 8.8 % (ref 0.0–14.0)
NEUT#: 4.3 10*3/uL (ref 1.5–6.5)
NEUT%: 67.5 % (ref 38.4–76.8)
PLATELETS: 339 10*3/uL (ref 145–400)
RBC: 3.77 10*6/uL (ref 3.70–5.45)
RDW: 15.7 % — ABNORMAL HIGH (ref 11.2–14.5)
WBC: 6.4 10*3/uL (ref 3.9–10.3)
lymph#: 1.1 10*3/uL (ref 0.9–3.3)

## 2015-09-19 LAB — COMPREHENSIVE METABOLIC PANEL
ALT: 22 U/L (ref 0–55)
ANION GAP: 11 meq/L (ref 3–11)
AST: 21 U/L (ref 5–34)
Albumin: 3.1 g/dL — ABNORMAL LOW (ref 3.5–5.0)
Alkaline Phosphatase: 125 U/L (ref 40–150)
BUN: 7.5 mg/dL (ref 7.0–26.0)
CALCIUM: 8.4 mg/dL (ref 8.4–10.4)
CHLORIDE: 111 meq/L — AB (ref 98–109)
CO2: 20 mEq/L — ABNORMAL LOW (ref 22–29)
CREATININE: 0.8 mg/dL (ref 0.6–1.1)
EGFR: 88 mL/min/{1.73_m2} — AB (ref >90)
Glucose: 105 mg/dl (ref 70–140)
Potassium: 4.2 mEq/L (ref 3.5–5.1)
Sodium: 142 mEq/L (ref 136–145)
Total Bilirubin: 0.33 mg/dL (ref 0.20–1.20)
Total Protein: 5.9 g/dL — ABNORMAL LOW (ref 6.4–8.3)

## 2015-09-19 MED ORDER — SODIUM CHLORIDE 0.9% FLUSH
10.0000 mL | INTRAVENOUS | Status: DC | PRN
  Administered 2015-09-19: 10 mL via INTRAVENOUS
  Filled 2015-09-19: qty 10

## 2015-09-19 MED ORDER — TRASTUZUMAB CHEMO INJECTION 440 MG
6.0000 mg/kg | Freq: Once | INTRAVENOUS | Status: AC
  Administered 2015-09-19: 399 mg via INTRAVENOUS
  Filled 2015-09-19: qty 19

## 2015-09-19 MED ORDER — HEPARIN SOD (PORK) LOCK FLUSH 100 UNIT/ML IV SOLN
500.0000 [IU] | Freq: Once | INTRAVENOUS | Status: AC | PRN
  Administered 2015-09-19: 500 [IU]
  Filled 2015-09-19: qty 5

## 2015-09-19 MED ORDER — ACETAMINOPHEN 325 MG PO TABS
ORAL_TABLET | ORAL | Status: AC
  Filled 2015-09-19: qty 2

## 2015-09-19 MED ORDER — DIPHENHYDRAMINE HCL 25 MG PO CAPS
25.0000 mg | ORAL_CAPSULE | Freq: Once | ORAL | Status: AC
  Administered 2015-09-19: 25 mg via ORAL

## 2015-09-19 MED ORDER — SODIUM CHLORIDE 0.9 % IJ SOLN
10.0000 mL | INTRAMUSCULAR | Status: DC | PRN
  Administered 2015-09-19: 10 mL
  Filled 2015-09-19: qty 10

## 2015-09-19 MED ORDER — ACETAMINOPHEN 325 MG PO TABS
650.0000 mg | ORAL_TABLET | Freq: Once | ORAL | Status: AC
  Administered 2015-09-19: 650 mg via ORAL

## 2015-09-19 MED ORDER — DIPHENHYDRAMINE HCL 25 MG PO CAPS
ORAL_CAPSULE | ORAL | Status: AC
  Filled 2015-09-19: qty 1

## 2015-09-19 MED ORDER — SODIUM CHLORIDE 0.9 % IV SOLN
Freq: Once | INTRAVENOUS | Status: AC
  Administered 2015-09-19: 09:00:00 via INTRAVENOUS

## 2015-09-19 NOTE — Patient Instructions (Signed)

## 2015-09-19 NOTE — Patient Instructions (Signed)
[facility] Discharge Instructions for Patients Receiving Chemotherapy  Today you received the following treatment today;  Herceptin.   BELOW ARE SYMPTOMS THAT SHOULD BE REPORTED IMMEDIATELY:  *FEVER GREATER THAN 100.5 F  *CHILLS WITH OR WITHOUT FEVER  NAUSEA AND VOMITING THAT IS NOT CONTROLLED WITH YOUR NAUSEA MEDICATION  *UNUSUAL SHORTNESS OF BREATH  *UNUSUAL BRUISING OR BLEEDING  TENDERNESS IN MOUTH AND THROAT WITH OR WITHOUT PRESENCE OF ULCERS  *URINARY PROBLEMS  *BOWEL PROBLEMS  UNUSUAL RASH Items with * indicate a potential emergency and should be followed up as soon as possible.  Feel free to call the clinic you have any questions or concerns. The clinic phone number is [phone].  Please show the [place] at check-in to the Emergency Department and triage nurse.

## 2015-09-19 NOTE — Progress Notes (Signed)
  Radiation Oncology         [phone]) [phone] ________________________________  Name: ASUNCION LUDLUM MRN: [id number]  Date: 09/18/2015  DOB: 1956-05-13  DIAGNOSIS:     ICD-9-CM ICD-10-CM   1. Breast cancer of upper-outer quadrant of right female breast ([place]) 174.4 C50.411      SIMULATION AND TREATMENT PLANNING NOTE  The patient presented for simulation prior to beginning her course of radiation treatment for her diagnosis of Right-sided breast cancer. The patient was placed in a supine position on a breast board. A customized vac-lock bag was constructed and this complex treatment device will be used on a daily basis during her treatment. In this fashion, a CT scan was obtained through the chest area and an isocenter was placed near the chest wall within the breast.  The patient will be planned to receive a course of radiation initially to a dose of 50.4 Gy. This will consist of a whole breast radiotherapy technique. To accomplish this, 2 customized blocks have been designed which will correspond to medial and lateral whole breast tangent fields. This treatment will be accomplished at 1.8 Gy per fraction. A forward planning technique will also be evaluated to determine if this approach improves the plan. It is anticipated that the patient will then receive a 10 Gy boost to the seroma cavity which has been contoured. This will be accomplished at 2 Gy per fraction.   This initial treatment will consist of a 3-D conformal technique. The seroma has been contoured as the primary target structure. Additionally, dose volume histograms of both this target as well as the lungs and heart will also be evaluated. Such an approach is necessary to ensure that the target area is adequately covered while the nearby critical  normal structures are adequately spared.  Plan:  The final anticipated total dose therefore will correspond to 60.4 Gy.    _______________________________   Jodelle Gross, MD, PhD

## 2015-09-20 DIAGNOSIS — C50411 Malignant neoplasm of upper-outer quadrant of right female breast: Secondary | ICD-10-CM | POA: Diagnosis not present

## 2015-09-21 ENCOUNTER — Telehealth (HOSPITAL_COMMUNITY): Payer: Self-pay | Admitting: *Deleted

## 2015-09-21 NOTE — Telephone Encounter (Signed)
Pt called to request to see Dr Rayann Heman for her SVT instead of Dr Curt Bears, discussed w/Amber Lynnell Jude, NP she states it's ok for pt to switch, have office call her to sch w/Allred as pt preference.  Mess sent to [facility] to pt for appt, pt is aware she will be contacted for appt w/Dr Allred

## 2015-09-25 ENCOUNTER — Ambulatory Visit
Admission: RE | Admit: 2015-09-25 | Discharge: 2015-09-25 | Disposition: A | Discharge status: Home / Self Care | Payer: Managed Care, Other (non HMO) | Source: Ambulatory Visit | Attending: Radiation Oncology | Admitting: Radiation Oncology

## 2015-09-25 DIAGNOSIS — C50411 Malignant neoplasm of upper-outer quadrant of right female breast: Secondary | ICD-10-CM

## 2015-09-25 MED ORDER — RADIAPLEXRX EX GEL
Freq: Once | CUTANEOUS | Status: AC
  Administered 2015-09-25: 13:00:00 via TOPICAL

## 2015-09-25 MED ORDER — ALRA NON-METALLIC DEODORANT (RAD-ONC)
1.0000 "application " | Freq: Once | TOPICAL | Status: AC
  Administered 2015-09-25: 1 via TOPICAL

## 2015-09-25 NOTE — Progress Notes (Addendum)
Patient education done, radiaplex alra, my business card, radiation therapy and you book, discussed ways to manage fatigue,skin  Irritation  Pain, , increase protein in diet and drink plenty water,stay hydrated,  Patient gave verbal understanding and teach back given 12:53 PM

## 2015-09-26 ENCOUNTER — Ambulatory Visit
Admission: RE | Admit: 2015-09-26 | Discharge: 2015-09-26 | Disposition: A | Discharge status: Home / Self Care | Payer: Managed Care, Other (non HMO) | Source: Ambulatory Visit | Attending: Radiation Oncology | Admitting: Radiation Oncology

## 2015-09-26 DIAGNOSIS — C50411 Malignant neoplasm of upper-outer quadrant of right female breast: Secondary | ICD-10-CM | POA: Diagnosis not present

## 2015-09-27 ENCOUNTER — Ambulatory Visit
Admission: RE | Admit: 2015-09-27 | Discharge: 2015-09-27 | Disposition: A | Discharge status: Home / Self Care | Payer: Managed Care, Other (non HMO) | Source: Ambulatory Visit | Attending: Radiation Oncology | Admitting: Radiation Oncology

## 2015-09-27 ENCOUNTER — Ambulatory Visit: Payer: Managed Care, Other (non HMO)

## 2015-09-27 DIAGNOSIS — C50411 Malignant neoplasm of upper-outer quadrant of right female breast: Secondary | ICD-10-CM | POA: Diagnosis not present

## 2015-09-28 ENCOUNTER — Ambulatory Visit
Admission: RE | Admit: 2015-09-28 | Discharge: 2015-09-28 | Disposition: A | Discharge status: Home / Self Care | Payer: Managed Care, Other (non HMO) | Source: Ambulatory Visit | Attending: Radiation Oncology | Admitting: Radiation Oncology

## 2015-09-28 DIAGNOSIS — C50411 Malignant neoplasm of upper-outer quadrant of right female breast: Secondary | ICD-10-CM | POA: Diagnosis not present

## 2015-09-29 ENCOUNTER — Ambulatory Visit
Admission: RE | Admit: 2015-09-29 | Discharge: 2015-09-29 | Disposition: A | Discharge status: Home / Self Care | Payer: Managed Care, Other (non HMO) | Source: Ambulatory Visit | Attending: Radiation Oncology | Admitting: Radiation Oncology

## 2015-09-29 VITALS — BP 110/45 | HR 90 | Resp 16 | Wt 141.4 lb

## 2015-09-29 DIAGNOSIS — C50411 Malignant neoplasm of upper-outer quadrant of right female breast: Secondary | ICD-10-CM | POA: Diagnosis not present

## 2015-09-29 NOTE — Progress Notes (Signed)
Weight stable. Diastolic bp low. Reports taking furosemide bid. Denies feeling light headed or dizzy. Denies skin changes within treatment field. Reports mild tenderness along right side just under right axilla. Continues to work out regularly. Denies fatigue.   BP 110/45 mmHg  Pulse 90  Resp 16  Wt 141 lb 6.4 oz (64.139 kg)  SpO2 100% Wt Readings from Last 3 Encounters:  09/29/15 141 lb 6.4 oz (64.139 kg)  09/08/15 149 lb (67.586 kg)  09/06/15 142 lb 6.4 oz (64.592 kg)

## 2015-09-29 NOTE — Progress Notes (Signed)
   Department of Radiation Oncology  Phone:  [phone] Fax:        [phone]  Weekly Treatment Note    Name: Catherine Lawson Date: 09/29/2015 MRN: [id number] DOB: [birth year]   Diagnosis:     ICD-9-CM ICD-10-CM   1. Breast cancer of upper-outer quadrant of right female breast ([place]) 174.4 C50.411      Current dose: 7.2 Gy  Current fraction: 4   MEDICATIONS: Current Outpatient Prescriptions  Medication Sig Dispense Refill  . flecainide (TAMBOCOR) 50 MG tablet Take 1 tablet (50 mg total) by mouth 2 (two) times daily. 60 tablet 1  . furosemide (LASIX) 20 MG tablet Take 0.5 tablets (10 mg total) by mouth daily. For 2 days 10 tablet 0  . hyaluronate sodium (RADIAPLEXRX) GEL Apply 1 application topically 2 (two) times daily.    Marland Kitchen loratadine (CLARITIN) 10 MG tablet Take 10 mg by mouth daily.    Marland Kitchen LORazepam (ATIVAN) 0.5 MG tablet Take 1 tablet (0.5 mg total) by mouth at bedtime as needed (Nausea or vomiting). 30 tablet 0  . metoprolol tartrate (LOPRESSOR) 25 MG tablet Take 0.5 tablets (12.5 mg total) by mouth 2 (two) times daily. 60 tablet 1  . non-metallic deodorant (ALRA) MISC Apply 1 application topically daily as needed.     No current facility-administered medications for this encounter.     ALLERGIES: Review of patient's allergies indicates no known allergies.   LABORATORY DATA:  Lab Results  Component Value Date   WBC 6.4 09/19/2015   HGB 11.3* 09/19/2015   HCT 34.2* 09/19/2015   MCV 90.6 09/19/2015   PLT 339 09/19/2015   Lab Results  Component Value Date   NA 142 09/19/2015   K 4.2 09/19/2015   CL 114* 09/08/2015   CO2 20* 09/19/2015   Lab Results  Component Value Date   ALT 22 09/19/2015   AST 21 09/19/2015   ALKPHOS 125 09/19/2015   BILITOT 0.33 09/19/2015     NARRATIVE: [name] was seen today for weekly treatment management. The chart was checked and the patient's films were reviewed.  Weight stable. Diastolic bp low. Reports taking  furosemide bid. Denies feeling light headed or dizzy. Denies skin changes within treatment field. Reports mild tenderness along right side just under right axilla. Continues to work out regularly. Denies fatigue.   BP 110/45 mmHg  Pulse 90  Resp 16  Wt 141 lb 6.4 oz (64.139 kg)  SpO2 100% Wt Readings from Last 3 Encounters:  09/29/15 141 lb 6.4 oz (64.139 kg)  09/08/15 149 lb (67.586 kg)  09/06/15 142 lb 6.4 oz (64.592 kg)     PHYSICAL EXAMINATION: weight is 141 lb 6.4 oz (64.139 kg). Her blood pressure is 110/45 and her pulse is 90. Her respiration is 16 and oxygen saturation is 100%.        ASSESSMENT: The patient is doing satisfactorily with treatment.  PLAN: We will continue with the patient's radiation treatment as planned.

## 2015-10-02 ENCOUNTER — Ambulatory Visit
Admission: RE | Admit: 2015-10-02 | Discharge: 2015-10-02 | Disposition: A | Discharge status: Home / Self Care | Payer: Managed Care, Other (non HMO) | Source: Ambulatory Visit | Attending: Radiation Oncology | Admitting: Radiation Oncology

## 2015-10-02 DIAGNOSIS — C50411 Malignant neoplasm of upper-outer quadrant of right female breast: Secondary | ICD-10-CM | POA: Diagnosis not present

## 2015-10-03 ENCOUNTER — Telehealth: Payer: Self-pay | Admitting: *Deleted

## 2015-10-03 ENCOUNTER — Ambulatory Visit
Admission: RE | Admit: 2015-10-03 | Discharge: 2015-10-03 | Disposition: A | Discharge status: Home / Self Care | Payer: Managed Care, Other (non HMO) | Source: Ambulatory Visit | Attending: Radiation Oncology | Admitting: Radiation Oncology

## 2015-10-03 DIAGNOSIS — C50411 Malignant neoplasm of upper-outer quadrant of right female breast: Secondary | ICD-10-CM | POA: Diagnosis not present

## 2015-10-03 NOTE — Telephone Encounter (Signed)
Spoke with patient to follow after start of radiation.  She states she is doing well with no complaints.  Encouraged her to call with any needs or concerns.

## 2015-10-04 ENCOUNTER — Ambulatory Visit
Admission: RE | Admit: 2015-10-04 | Discharge: 2015-10-04 | Disposition: A | Discharge status: Home / Self Care | Payer: Managed Care, Other (non HMO) | Source: Ambulatory Visit | Attending: Radiation Oncology | Admitting: Radiation Oncology

## 2015-10-04 DIAGNOSIS — C50411 Malignant neoplasm of upper-outer quadrant of right female breast: Secondary | ICD-10-CM | POA: Diagnosis not present

## 2015-10-05 ENCOUNTER — Ambulatory Visit
Admission: RE | Admit: 2015-10-05 | Discharge: 2015-10-05 | Disposition: A | Discharge status: Home / Self Care | Payer: Managed Care, Other (non HMO) | Source: Ambulatory Visit | Attending: Radiation Oncology | Admitting: Radiation Oncology

## 2015-10-05 DIAGNOSIS — C50411 Malignant neoplasm of upper-outer quadrant of right female breast: Secondary | ICD-10-CM | POA: Diagnosis not present

## 2015-10-06 ENCOUNTER — Ambulatory Visit
Admission: RE | Admit: 2015-10-06 | Discharge: 2015-10-06 | Disposition: A | Discharge status: Home / Self Care | Payer: Managed Care, Other (non HMO) | Source: Ambulatory Visit | Attending: Radiation Oncology | Admitting: Radiation Oncology

## 2015-10-06 ENCOUNTER — Encounter: Payer: Self-pay | Admitting: Radiation Oncology

## 2015-10-06 ENCOUNTER — Ambulatory Visit: Payer: Managed Care, Other (non HMO) | Admitting: Radiation Oncology

## 2015-10-06 DIAGNOSIS — C50411 Malignant neoplasm of upper-outer quadrant of right female breast: Secondary | ICD-10-CM | POA: Diagnosis not present

## 2015-10-09 ENCOUNTER — Ambulatory Visit
Admission: RE | Admit: 2015-10-09 | Discharge: 2015-10-09 | Disposition: A | Discharge status: Home / Self Care | Payer: Managed Care, Other (non HMO) | Source: Ambulatory Visit | Attending: Radiation Oncology | Admitting: Radiation Oncology

## 2015-10-09 ENCOUNTER — Encounter: Payer: Self-pay | Admitting: Radiation Oncology

## 2015-10-09 VITALS — BP 118/68 | HR 72 | Temp 98.3°F | Resp 20 | Wt 141.6 lb

## 2015-10-09 DIAGNOSIS — C50411 Malignant neoplasm of upper-outer quadrant of right female breast: Secondary | ICD-10-CM

## 2015-10-09 NOTE — Progress Notes (Signed)
Weekly rad txs right breast, no skin changes noted from last week, skin intact, using radiaplex bid,  Doing well 12:15 PM BP 118/68 mmHg  Pulse 72  Temp(Src) 98.3 F (36.8 C) (Oral)  Resp 20  Wt 141 lb 9.6 oz (64.229 kg)  Wt Readings from Last 3 Encounters:  10/09/15 141 lb 9.6 oz (64.229 kg)  09/29/15 141 lb 6.4 oz (64.139 kg)  09/08/15 149 lb (67.586 kg)

## 2015-10-09 NOTE — Progress Notes (Signed)
   Department of Radiation Oncology  Phone:  [phone] Fax:        [phone]  Weekly Treatment Note    Name: Catherine Lawson Date: 10/09/2015 MRN: [id number] DOB: 1955/08/11   Diagnosis:     ICD-9-CM ICD-10-CM   1. Breast cancer of upper-outer quadrant of right female breast ([place]) 174.4 C50.411      Current dose: 18 Gy  Current fraction: 10   MEDICATIONS: Current Outpatient Prescriptions  Medication Sig Dispense Refill  . flecainide (TAMBOCOR) 50 MG tablet Take 1 tablet (50 mg total) by mouth 2 (two) times daily. 60 tablet 1  . furosemide (LASIX) 20 MG tablet Take 0.5 tablets (10 mg total) by mouth daily. For 2 days 10 tablet 0  . hyaluronate sodium (RADIAPLEXRX) GEL Apply 1 application topically 2 (two) times daily.    Marland Kitchen loratadine (CLARITIN) 10 MG tablet Take 10 mg by mouth daily.    Marland Kitchen LORazepam (ATIVAN) 0.5 MG tablet Take 1 tablet (0.5 mg total) by mouth at bedtime as needed (Nausea or vomiting). 30 tablet 0  . metoprolol tartrate (LOPRESSOR) 25 MG tablet Take 0.5 tablets (12.5 mg total) by mouth 2 (two) times daily. 60 tablet 1  . non-metallic deodorant (ALRA) MISC Apply 1 application topically daily as needed.     No current facility-administered medications for this encounter.     ALLERGIES: Review of patient's allergies indicates no known allergies.   LABORATORY DATA:  Lab Results  Component Value Date   WBC 6.4 09/19/2015   HGB 11.3* 09/19/2015   HCT 34.2* 09/19/2015   MCV 90.6 09/19/2015   PLT 339 09/19/2015   Lab Results  Component Value Date   NA 142 09/19/2015   K 4.2 09/19/2015   CL 114* 09/08/2015   CO2 20* 09/19/2015   Lab Results  Component Value Date   ALT 22 09/19/2015   AST 21 09/19/2015   ALKPHOS 125 09/19/2015   BILITOT 0.33 09/19/2015     NARRATIVE: Catherine Lawson was seen today for weekly treatment management. The chart was checked and the patient's films were reviewed.  Weekly rad txs right breast, no skin changes  noted from last week, skin intact, using radiaplex bid,  Doing well 12:22 PM BP 118/68 mmHg  Pulse 72  Temp(Src) 98.3 F (36.8 C) (Oral)  Resp 20  Wt 141 lb 9.6 oz (64.229 kg)  Wt Readings from Last 3 Encounters:  10/09/15 141 lb 9.6 oz (64.229 kg)  09/29/15 141 lb 6.4 oz (64.139 kg)  09/08/15 149 lb (67.586 kg)    PHYSICAL EXAMINATION: weight is 141 lb 9.6 oz (64.229 kg). Her oral temperature is 98.3 F (36.8 C). Her blood pressure is 118/68 and her pulse is 72. Her respiration is 20.      The patient has not had any significant skin change as of yet.  ASSESSMENT: The patient is doing satisfactorily with treatment.  PLAN: We will continue with the patient's radiation treatment as planned.

## 2015-10-10 ENCOUNTER — Telehealth: Payer: Self-pay | Admitting: Oncology

## 2015-10-10 ENCOUNTER — Ambulatory Visit: Payer: Managed Care, Other (non HMO)

## 2015-10-10 ENCOUNTER — Other Ambulatory Visit (HOSPITAL_BASED_OUTPATIENT_CLINIC_OR_DEPARTMENT_OTHER): Payer: Managed Care, Other (non HMO)

## 2015-10-10 ENCOUNTER — Ambulatory Visit (HOSPITAL_BASED_OUTPATIENT_CLINIC_OR_DEPARTMENT_OTHER): Payer: Managed Care, Other (non HMO)

## 2015-10-10 ENCOUNTER — Ambulatory Visit (HOSPITAL_BASED_OUTPATIENT_CLINIC_OR_DEPARTMENT_OTHER): Payer: Managed Care, Other (non HMO) | Admitting: Oncology

## 2015-10-10 ENCOUNTER — Ambulatory Visit
Admission: RE | Admit: 2015-10-10 | Discharge: 2015-10-10 | Disposition: A | Discharge status: Home / Self Care | Payer: Managed Care, Other (non HMO) | Source: Ambulatory Visit | Attending: Radiation Oncology | Admitting: Radiation Oncology

## 2015-10-10 VITALS — BP 113/58 | HR 71 | Temp 98.1°F | Resp 18 | Ht 65.0 in | Wt 141.5 lb

## 2015-10-10 DIAGNOSIS — Z170 Estrogen receptor positive status [ER+]: Secondary | ICD-10-CM | POA: Diagnosis not present

## 2015-10-10 DIAGNOSIS — C50912 Malignant neoplasm of unspecified site of left female breast: Secondary | ICD-10-CM

## 2015-10-10 DIAGNOSIS — C50411 Malignant neoplasm of upper-outer quadrant of right female breast: Secondary | ICD-10-CM

## 2015-10-10 DIAGNOSIS — Z5112 Encounter for antineoplastic immunotherapy: Secondary | ICD-10-CM

## 2015-10-10 DIAGNOSIS — Z95828 Presence of other vascular implants and grafts: Secondary | ICD-10-CM

## 2015-10-10 LAB — COMPREHENSIVE METABOLIC PANEL
ALBUMIN: 3.6 g/dL (ref 3.5–5.0)
ALK PHOS: 104 U/L (ref 40–150)
ALT: 48 U/L (ref 0–55)
ANION GAP: 8 meq/L (ref 3–11)
AST: 44 U/L — AB (ref 5–34)
BUN: 10.2 mg/dL (ref 7.0–26.0)
CALCIUM: 8.7 mg/dL (ref 8.4–10.4)
CO2: 24 mEq/L (ref 22–29)
CREATININE: 0.8 mg/dL (ref 0.6–1.1)
Chloride: 111 mEq/L — ABNORMAL HIGH (ref 98–109)
EGFR: 77 mL/min/{1.73_m2} — ABNORMAL LOW (ref >90)
Glucose: 108 mg/dl (ref 70–140)
POTASSIUM: 4.4 meq/L (ref 3.5–5.1)
Sodium: 143 mEq/L (ref 136–145)
Total Bilirubin: 0.54 mg/dL (ref 0.20–1.20)
Total Protein: 6.3 g/dL — ABNORMAL LOW (ref 6.4–8.3)

## 2015-10-10 LAB — CBC WITH DIFFERENTIAL/PLATELET
BASO%: 3 % — ABNORMAL HIGH (ref 0.0–2.0)
BASOS ABS: 0.1 10*3/uL (ref 0.0–0.1)
EOS%: 8.7 % — AB (ref 0.0–7.0)
Eosinophils Absolute: 0.3 10*3/uL (ref 0.0–0.5)
HEMATOCRIT: 39.6 % (ref 34.8–46.6)
HEMOGLOBIN: 13 g/dL (ref 11.6–15.9)
LYMPH#: 1.4 10*3/uL (ref 0.9–3.3)
LYMPH%: 35.4 % (ref 14.0–49.7)
MCH: 29.5 pg (ref 25.1–34.0)
MCHC: 32.9 g/dL (ref 31.5–36.0)
MCV: 89.7 fL (ref 79.5–101.0)
MONO#: 0.3 10*3/uL (ref 0.1–0.9)
MONO%: 8.5 % (ref 0.0–14.0)
NEUT#: 1.7 10*3/uL (ref 1.5–6.5)
NEUT%: 44.4 % (ref 38.4–76.8)
PLATELETS: 208 10*3/uL (ref 145–400)
RBC: 4.42 10*6/uL (ref 3.70–5.45)
RDW: 14.7 % — AB (ref 11.2–14.5)
WBC: 3.9 10*3/uL (ref 3.9–10.3)

## 2015-10-10 MED ORDER — SODIUM CHLORIDE 0.9% FLUSH
10.0000 mL | INTRAVENOUS | Status: DC | PRN
  Administered 2015-10-10: 10 mL via INTRAVENOUS
  Filled 2015-10-10: qty 10

## 2015-10-10 MED ORDER — DIPHENHYDRAMINE HCL 25 MG PO CAPS
25.0000 mg | ORAL_CAPSULE | Freq: Once | ORAL | Status: AC
  Administered 2015-10-10: 25 mg via ORAL

## 2015-10-10 MED ORDER — SODIUM CHLORIDE 0.9 % IV SOLN
Freq: Once | INTRAVENOUS | Status: AC
Start: 2015-10-10 — End: 2015-10-10
  Administered 2015-10-10: 10:00:00 via INTRAVENOUS

## 2015-10-10 MED ORDER — HEPARIN SOD (PORK) LOCK FLUSH 100 UNIT/ML IV SOLN
500.0000 [IU] | Freq: Once | INTRAVENOUS | Status: AC | PRN
  Administered 2015-10-10: 500 [IU]
  Filled 2015-10-10: qty 5

## 2015-10-10 MED ORDER — SODIUM CHLORIDE 0.9 % IJ SOLN
10.0000 mL | INTRAMUSCULAR | Status: DC | PRN
  Administered 2015-10-10: 10 mL
  Filled 2015-10-10: qty 10

## 2015-10-10 MED ORDER — TRASTUZUMAB CHEMO INJECTION 440 MG
6.0000 mg/kg | Freq: Once | INTRAVENOUS | Status: AC
  Administered 2015-10-10: 399 mg via INTRAVENOUS
  Filled 2015-10-10: qty 19

## 2015-10-10 MED ORDER — ACETAMINOPHEN 325 MG PO TABS
ORAL_TABLET | ORAL | Status: AC
  Filled 2015-10-10: qty 2

## 2015-10-10 MED ORDER — ACETAMINOPHEN 325 MG PO TABS
650.0000 mg | ORAL_TABLET | Freq: Once | ORAL | Status: AC
  Administered 2015-10-10: 650 mg via ORAL

## 2015-10-10 MED ORDER — DIPHENHYDRAMINE HCL 25 MG PO CAPS
ORAL_CAPSULE | ORAL | Status: AC
  Filled 2015-10-10: qty 1

## 2015-10-10 NOTE — Patient Instructions (Signed)

## 2015-10-10 NOTE — Telephone Encounter (Signed)
sch appt and printed avs. Override needed to sch GM appt on 5/9

## 2015-10-10 NOTE — Progress Notes (Signed)
[place]  [phone] [phone]   ID: Catherine Lawson DOB: 11-17-1955  MR#: [id number]  PRF#:[id number]  Patient Care Team: Cari Caraway, MD as PCP - General (Family Medicine) Excell Seltzer, MD as Consulting Physician (General Surgery) Chauncey Cruel, MD as Consulting Physician (Oncology) Kyung Rudd, MD as Consulting Physician (Radiation Oncology) Mauro Kaufmann, RN as Registered Nurse Rockwell Germany, RN as Registered Nurse Sylvan Cheese, NP as Nurse Practitioner (Nurse Practitioner) Vania Rea, MD as Consulting Physician (Obstetrics and Gynecology) Thompson Grayer, MD as Consulting Physician (Cardiology) PCP: Cari Caraway, MD OTHER MD:  CHIEF COMPLAINT: Estrogen receptor positive breast cancer  CURRENT TREATMENT: Adjuvant radiation, trastuzumab  BREAST CANCER HISTORY: From the original intake note:     "Catherine Lawson" had routine screening mammography at [facility] 01/24/2015. This showed a 1.6 cm mass in the right breast at the 11:00 position, and on 0.8 cm irregular density also at the 11:00 position farther away from the nipple. On 02/01/2015 she underwent a unilateral right diagnostic mammography at [facility]. The breast density was category B. In addition to the 2 masses previously noted there was a cluster of calcifications at the 9:00 position. Ultrasonography the same day showed a 1.9 cm oval mass, which was hypoechoic with no vascularity and hard on L a Stogner if he, a 7 mm mass at the 10:00 position with no vascularity and intermediate L a Stogner 3, and a lymph node with uniform cortex thickening in the right axilla. The 2 masses in question were separated by 2 cm.  On 02/02/2015 the patient underwent biopsy of what appears to have been the larger of the 2 breast masses (I cannot locate the biopsy report). This documented invasive ductal carcinoma, grade 2 or 3, with micropapillary features, estrogen and progesterone receptor positive,  with an MIB-1 of 40%, and HER-2 not amplified with a signals ratio of 1.36 and a number per cell of 3.60. There was evidence of lymphovascular invasion.  Because of bleeding from the first biopsy, the second breast mass could not be performed. The lymph node was aspirated, not cord, and this appeared benign but the concordance is questionable.  The patient's subsequent history is as detailed below  INTERVAL HISTORY: Catherine Lawson returns today for follow-up of her estrogen receptor positive and HER-2 positive breast cancer, accompanied by her husband Event organiser. Catherine Lawson continues on Herceptin every 3 weeks, which she tolerates well. She has no side effects from it that she is aware of. Her port is also working well.--Since her last visit here she has been started on adjuvant radiation by Dr. Lisbeth Renshaw. So far she has had no side effects from that and particularly denies unusual fatigue or any skin changes.  REVIEW OF SYSTEMS:  Catherine Lawson is back to her more usual workout routine, and has started some workout classes. Sometimes she feels a little tired in the early afternoon but she pushes through. She "front loads" her days. She has also put herself on a very healthy diet which we discussed today. [name] will certainly benefit from that. 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 She is currently on flecainide with good tolerance but is awaiting definitive ablation. Aside from these issues a detailed review of systems today was noncontributory  PAST MEDICAL HISTORY: Past Medical History  Diagnosis Date  . Breast cancer of upper-outer quadrant of right female breast ([place]) 02/06/2015  . Breast cancer ([place])   . Family history of breast cancer   . Family history of  colon cancer   . Family history of ovarian cancer   . Family history of stomach cancer   . Arthritis     bil thumb joints  . Dysrhythmia     fast HR at times due to stress, no chest pain or SOB    PAST SURGICAL HISTORY: Past Surgical History  Procedure  Laterality Date  . Wisdom tooth extraction    . Radioactive seed guided mastectomy with axillary sentinel lymph node biopsy Right 05/29/2015    Procedure: RADIOACTIVE SEED RIGHT BREAST LUMPECTOMY WITH AXILLARY SENTINEL LYMPH NODE BIOPSY;  Surgeon: Excell Seltzer, MD;  Location: [facility];  Service: General;  Laterality: Right;  . Re-excision of breast lumpectomy Right 06/12/2015    Procedure: RE-EXCISION OF RIGHT  BREAST LUMPECTOMY;  Surgeon: Excell Seltzer, MD;  Location: [facility];  Service: General;  Laterality: Right;  . Portacath placement Right 06/27/2015    Procedure: INSERTION PORT-A-CATH;  Surgeon: Excell Seltzer, MD;  Location: [place];  Service: General;  Laterality: Right;    FAMILY HISTORY Family History  Problem Relation Age of Onset  . Breast cancer Sister 86  . Colon cancer Father 58  . Cancer Maternal Grandmother     probable ovarian cancer  . Stomach cancer Paternal Grandmother 46  . Brain cancer Other 10    Medulloblastoma  . Parkinson's disease Mother   . Lung cancer Paternal Uncle     smoker  . Stomach cancer Other     PGMs sister  . Stomach cancer Other     PMGs brother   the patient's father is still living, at age 30. The patient's mother died from Parkinson's disease complications at age 55. The patient had 5 brothers, 4 sisters. One sister was diagnosed with breast cancer at age 24 and died at age 40. The patient's father was diagnosed with colon cancer at the age of 44. A nephew was diagnosed with medulloblastoma at age 71 and died at age 67. The paternal grandmother was diagnosed with stomach cancer at age 8. The maternal grandmother was diagnosed with metastatic cancer of unknown type at age 84. (The description is suggestive of an ovarian cancer).  GYNECOLOGIC HISTORY:  No LMP recorded. Patient is postmenopausal. Menarche age 34. The patient is GX P0. She stopped having periods in 2014. She did not take hormone  replacement. She took oral contraceptives for approximately 20 years with no complications.  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 Her husband [name] works for a Merchandiser, retail in [employer] [place] [employer] credited Warehouse manager". [name] has 2 daughters from an earlier marriage, Ronalee Belts lives in [place] and works in recruiting, and Corbin Hott lives in [place] and works in Press photographer. They have 1 grandchild. The patient attends the [employer]   HEALTH MAINTENANCE: Social History  Substance Use Topics  . Smoking status: Never Smoker   . Smokeless tobacco: Not on file  . Alcohol Use: Yes     Comment: social     Colonoscopy: 2014/lobe are  PAP: 2015  Bone density: Remote  Lipid panel:  No Known Allergies  Current Outpatient Prescriptions  Medication Sig Dispense Refill  . flecainide (TAMBOCOR) 50 MG tablet Take 1 tablet (50 mg total) by mouth 2 (two) times daily. 60 tablet 1  . hyaluronate sodium (RADIAPLEXRX) GEL Apply 1 application topically 2 (two) times daily.    . non-metallic deodorant (  ALRA) MISC Apply 1 application topically daily as needed.     No current facility-administered medications for this visit.   Facility-Administered Medications Ordered in Other Visits  Medication Dose Route Frequency Provider Last Rate Last Dose  . heparin lock flush 100 unit/mL  500 Units Intracatheter Once PRN Chauncey Cruel, MD      . sodium chloride 0.9 % injection 10 mL  10 mL Intracatheter PRN Chauncey Cruel, MD      . trastuzumab (HERCEPTIN) 399 mg in sodium chloride 0.9 % 250 mL chemo infusion  6 mg/kg (Treatment Plan Actual) Intravenous Once Chauncey Cruel, MD        OBJECTIVE: Middle-aged white woman In no acute distress Filed Vitals:   10/10/15 0954  BP: 113/58  Pulse: 71  Temp: 98.1 F (36.7 C)  Resp: 18     Body mass index is 23.55 kg/(m^2).    ECOG FS:1 - Symptomatic  but completely ambulatory  Sclerae unicteric, pupils round and equal Oropharynx clear and moist-- no thrush or other lesions No cervical or supraclavicular adenopathy Lungs no rales or rhonchi Heart regular rate and rhythm Abd soft, nontender, positive bowel sounds MSK no focal spinal tenderness, no upper extremity lymphedema Neuro: nonfocal, well oriented, appropriate affect Breasts: The right breast is status post lumpectomy and is currently receiving radiation. There is no erythema. The cosmetic result is excellent. There is no evidence of disease recurrence. The right axilla is benign. The left breast is unremarkable.    LAB RESULTS:  CMP     Component Value Date/Time   NA 143 10/10/2015 0837   NA 142 09/08/2015 0226   K 4.4 10/10/2015 0837   K 3.8 09/08/2015 0226   CL 114* 09/08/2015 0226   CO2 24 10/10/2015 0837   CO2 21* 09/08/2015 0226   GLUCOSE 108 10/10/2015 0837   GLUCOSE 104* 09/08/2015 0226   BUN 10.2 10/10/2015 0837   BUN 8 09/08/2015 0226   CREATININE 0.8 10/10/2015 0837   CREATININE 0.86 09/08/2015 0226   CALCIUM 8.7 10/10/2015 0837   CALCIUM 7.7* 09/08/2015 0226   PROT 6.3* 10/10/2015 0837   PROT 5.0* 09/08/2015 0226   ALBUMIN 3.6 10/10/2015 0837   ALBUMIN 2.7* 09/08/2015 0226   AST 44* 10/10/2015 0837   AST 20 09/08/2015 0226   ALT 48 10/10/2015 0837   ALT 24 09/08/2015 0226   ALKPHOS 104 10/10/2015 0837   ALKPHOS 112 09/08/2015 0226   BILITOT 0.54 10/10/2015 0837   BILITOT 0.6 09/08/2015 0226   GFRNONAA >60 09/08/2015 0226   GFRAA >60 09/08/2015 0226    INo results found for: SPEP, UPEP  Lab Results  Component Value Date   WBC 3.9 10/10/2015   NEUTROABS 1.7 10/10/2015   HGB 13.0 10/10/2015   HCT 39.6 10/10/2015   MCV 89.7 10/10/2015   PLT 208 10/10/2015      Chemistry      Component Value Date/Time   NA 143 10/10/2015 0837   NA 142 09/08/2015 0226   K 4.4 10/10/2015 0837   K 3.8 09/08/2015 0226   CL 114* 09/08/2015 0226   CO2 24  10/10/2015 0837   CO2 21* 09/08/2015 0226   BUN 10.2 10/10/2015 0837   BUN 8 09/08/2015 0226   CREATININE 0.8 10/10/2015 0837   CREATININE 0.86 09/08/2015 0226      Component Value Date/Time   CALCIUM 8.7 10/10/2015 0837   CALCIUM 7.7* 09/08/2015 0226   ALKPHOS 104 10/10/2015 0837   ALKPHOS  112 09/08/2015 0226   AST 44* 10/10/2015 0837   AST 20 09/08/2015 0226   ALT 48 10/10/2015 0837   ALT 24 09/08/2015 0226   BILITOT 0.54 10/10/2015 0837   BILITOT 0.6 09/08/2015 0226       No results found for: LABCA2  No components found for: MEBRA309  No results for input(s): INR in the last 168 hours.  Urinalysis No results found for: COLORURINE, APPEARANCEUR, LABSPEC, PHURINE, GLUCOSEU, HGBUR, BILIRUBINUR, KETONESUR, PROTEINUR, UROBILINOGEN, NITRITE, LEUKOCYTESUR  STUDIES: No results found.  ASSESSMENT: 60 y.o. [place] woman status post right breast biopsy 02/02/2015 for a clinically multifocal T1c NX, stage IA invasive ductal carcinoma, grade 2 or 3, estrogen and progesterone receptor positive, with HER-2 not amplified and the MIB-1 at 40%  (1) biopsy of a suspicious left breast mass 03/28/2015 showed only usual ductal hyperplasia, no malignancy identified.  (2) genetics testing 03/06/2015 through the Breast/Ovarian gene panel offered by [facility] found no deleterious mutations in ATM, BARD1, BRCA1, BRCA2, BRIP1, CDH1, CHEK2, EPCAM, FANCC, MLH1, MSH2, MSH6, NBN, PALB2, PMS2, PTEN, RAD51C, RAD51D, TP53, and XRCC2.  (a) there was a Variant of Unknown Significance in the BRCA2 gene called [id number]  (3) status post right lumpectomy and sentinel lymph node sampling 05/29/2015 for an mpT1b pN0, stage IA invasive ductal carcinoma, grade 2, with positive margins  (a) margins cleared with subsequent surgery 06/12/2015  (3) Oncotype score of 35 predicts a risk of outside the breast recurrence within 10 years of 24% if the patient's only systemic therapy is tamoxifen for 5 years. It also  predicts significant benefit from chemotherapy.  (4) adjuvant chemotherapy consisting of cyclophosphamide and docetaxel 4, given 3 weeks apart, starting 06/27/2015, completed 08/30/2015  (5) FISH panel returned HER-2 positive. Started  trastuzumab every 3 weeks through 1 year, starting 08/08/2014  (a) echocardiogram on 07/11/15 showed an EF of 60-65%  (6) adjuvant radiation in process  (7) received anastrozole 02/08/2015 through December 2016, to resume at the completion of radiation  (a) DEXA scan at [facility] 03/28/2015 showed osteopenia, with a T score of -2.1  PLAN: Catherine Lawson is tolerating radiation well so far. She understands it would not be unusual if she started experiencing fatigue and some skin changes with the upcoming treatments. She understands that the fatigue may last beyond the stopping of radiation for a month or 2. The best thing to do for that is exactly what she is doing, namely exercising as tolerated. Simply resting would only make her more tired.  She is tolerating the Herceptin with no side effects that she is aware of. She is scheduled for repeat echocardiogram in April. The plan of course is to continue that to total one year. She is tolerating the flecainide without any side effects that she is aware of. However she would like to get off medication as soon as practicable and she will be scheduling her ablation with Dr. all red. Once she has a date my plan would be to omit the Herceptin treatment well for the ablation procedure, simply as a precaution, since I do not have any data that continuing the Herceptin right through would make any difference.  She had many questions today regarding her prognosis, the HER-2 positive and HER-2 negative cancer and other issues. She understands that although cancer is a clonal disease, there are many subclone's. This is why we are never sure that every single clone has been eradicated area nevertheless her prognosis is good and her chance of  cure is good.  She is going to return to see me in May. At that time if she is ready we will resume the anastrozole. At that time we will also discuss whether she would prefer to go on an oral bisphosphonate, or denosumab or zolendronate.  She has a good understanding of the overall plan. She will call me with any problems that may develop before her next visit here.  Chauncey Cruel, MD 10/10/2015

## 2015-10-10 NOTE — Patient Instructions (Signed)
[facility] Discharge Instructions for Patients Receiving Chemotherapy  Today you received the following chemotherapy agents: Herceptin   To help prevent nausea and vomiting after your treatment, we encourage you to take your nausea medication as directed.    If you develop nausea and vomiting that is not controlled by your nausea medication, call the clinic.   BELOW ARE SYMPTOMS THAT SHOULD BE REPORTED IMMEDIATELY:  *FEVER GREATER THAN 100.5 F  *CHILLS WITH OR WITHOUT FEVER  NAUSEA AND VOMITING THAT IS NOT CONTROLLED WITH YOUR NAUSEA MEDICATION  *UNUSUAL SHORTNESS OF BREATH  *UNUSUAL BRUISING OR BLEEDING  TENDERNESS IN MOUTH AND THROAT WITH OR WITHOUT PRESENCE OF ULCERS  *URINARY PROBLEMS  *BOWEL PROBLEMS  UNUSUAL RASH Items with * indicate a potential emergency and should be followed up as soon as possible.  Feel free to call the clinic you have any questions or concerns. The clinic phone number is [phone].  Please show the CHEMO ALERT CARD at check-in to the Emergency Department and triage nurse.   

## 2015-10-11 ENCOUNTER — Encounter: Payer: Self-pay | Admitting: Internal Medicine

## 2015-10-11 ENCOUNTER — Ambulatory Visit (INDEPENDENT_AMBULATORY_CARE_PROVIDER_SITE_OTHER): Payer: Managed Care, Other (non HMO) | Admitting: Internal Medicine

## 2015-10-11 ENCOUNTER — Ambulatory Visit
Admission: RE | Admit: 2015-10-11 | Discharge: 2015-10-11 | Disposition: A | Discharge status: Home / Self Care | Payer: Managed Care, Other (non HMO) | Source: Ambulatory Visit | Attending: Radiation Oncology | Admitting: Radiation Oncology

## 2015-10-11 VITALS — BP 112/84 | HR 73 | Ht 65.0 in | Wt 140.6 lb

## 2015-10-11 DIAGNOSIS — I471 Supraventricular tachycardia: Secondary | ICD-10-CM

## 2015-10-11 DIAGNOSIS — C50411 Malignant neoplasm of upper-outer quadrant of right female breast: Secondary | ICD-10-CM | POA: Diagnosis not present

## 2015-10-11 NOTE — Patient Instructions (Addendum)
Medication Instructions:  Your physician recommends that you continue on your current medications as directed. Please refer to the Current Medication list given to you today.   Labwork: Your physician recommends that you return for lab work on 11/09/15 at 8:30 am   Testing/Procedures: Your physician has recommended that you have an ablation. Catheter ablation is a medical procedure used to treat some cardiac arrhythmias (irregular heartbeats). During catheter ablation, a long, thin, flexible tube is put into a blood vessel in your groin (upper thigh), or neck. This tube is called an ablation catheter. It is then guided to your heart through the blood vessel. Radio frequency waves destroy small areas of heart tissue where abnormal heartbeats may cause an arrhythmia to start. Please see the instruction sheet given to you today.---11/14/15  Please arrive at the [facility] on 11/14/15 at 5:30am. Do not eat or drink after midnight and do not take any medications the morning of your procedure.  Take your last dose of Flecainide on 11/11/15    Follow-Up: Your physician recommends that you schedule a follow-up appointment in: 4 weeks from 11/14/15 with Dr Rayann Heman   Any Other Special Instructions Will Be Listed Below (If Applicable).     If you need a refill on your cardiac medications before your next appointment, please call your pharmacy.

## 2015-10-11 NOTE — Progress Notes (Signed)
Electrophysiology Office Note   Date:  10/11/2015   ID:  CLYDIA DURFEY, DOB 02/21/1956, MRN [id number]  PCP:  Cari Caraway, MD  Cardiologist:  Dr Haroldine Laws  Chief Complaint  Patient presents with  . New Patient (Initial Visit)     History of Present Illness: Catherine Lawson is a 60 y.o. female who presents today for electrophysiology evaluation.   She has previously been evaluated by Dr Curt Bears for SVT (his notes are reviewed).  She has had difficulty with SVT for quite some time.  Episodes have increased in frequency and duration over the past year.  Recently, she has had more than 1 episode per month.  She is unaware of triggers or precipitants.  She has found that she could terminated episodes with vagal maneuvers but has not received adenosine in the past that she recalls.  She reports tachypalpitations and fatigue with episodes.  Recently, she was unable to terminate SVT and presented to the ED.  She was started on flecainide.  She has had no further SVT.  She does not feel that flecainide is a good long term option, but has been a good option while she has received treatment recently for breast cancer.   Today, she denies symptoms of palpitations, chest pain, shortness of breath, orthopnea, PND, lower extremity edema, claudication, dizziness, presyncope, syncope, bleeding, or neurologic sequela. The patient is tolerating medications without difficulties and is otherwise without complaint today.    Past Medical History  Diagnosis Date  . Breast cancer of upper-outer quadrant of right female breast ([place]) 02/06/2015  . Breast cancer ([place])   . Family history of breast cancer   . Family history of colon cancer   . Family history of ovarian cancer   . Family history of stomach cancer   . Arthritis     bil thumb joints  . SVT (supraventricular tachycardia) ([facility])     fast HR at times due to stress, no chest pain or SOB   Past Surgical History  Procedure Laterality Date  .  Wisdom tooth extraction    . Radioactive seed guided mastectomy with axillary sentinel lymph node biopsy Right 05/29/2015    Procedure: RADIOACTIVE SEED RIGHT BREAST LUMPECTOMY WITH AXILLARY SENTINEL LYMPH NODE BIOPSY;  Surgeon: Excell Seltzer, MD;  Location: [facility];  Service: General;  Laterality: Right;  . Re-excision of breast lumpectomy Right 06/12/2015    Procedure: RE-EXCISION OF RIGHT  BREAST LUMPECTOMY;  Surgeon: Excell Seltzer, MD;  [facility];  Service: General;  Laterality: Right;  . Portacath placement Right 06/27/2015    Procedure: INSERTION PORT-A-CATH;  Surgeon: Excell Seltzer, MD;  Location: [facility];  Service: General;  Laterality: Right;     Current Outpatient Prescriptions  Medication Sig Dispense Refill  . cetirizine (ZYRTEC) 10 MG tablet Take 10 mg by mouth daily.    . flecainide (TAMBOCOR) 50 MG tablet Take 1 tablet (50 mg total) by mouth 2 (two) times daily. 60 tablet 1  . hyaluronate sodium (RADIAPLEXRX) GEL Apply 1 application topically 2 (two) times daily.    . non-metallic deodorant Jethro Poling) MISC Apply 1 application topically daily as needed.     No current facility-administered medications for this visit.    Allergies:   Review of patient's allergies indicates no known allergies.   Social History:  The patient  reports that she has never smoked. She does not have any smokeless tobacco history on file. She reports that she drinks alcohol. She reports that  she does not use illicit drugs.   Family History:  The patient's family history includes Brain cancer (age of onset: 78) in her other; Breast cancer (age of onset: 78) in her sister; Cancer in her maternal grandmother; Colon cancer (age of onset: 49) in her father; Lung cancer in her paternal uncle; Parkinson's disease in her mother; Stomach cancer in her other and other; Stomach cancer (age of onset: 60) in her paternal grandmother.    ROS:  Please see the  history of present illness.   All other systems are reviewed and negative.    PHYSICAL EXAM: VS:  BP 112/84 mmHg  Pulse 73  Ht 5\' 5"  (1.651 m)  Wt 140 lb 9.6 oz (63.776 kg)  BMI 23.40 kg/m2 , BMI Body mass index is 23.4 kg/(m^2). GEN: Well nourished, well developed, in no acute distress HEENT: normal Neck: no JVD, carotid bruits, or masses Cardiac: RRR; no murmurs, rubs, or gallops,no edema  Respiratory:  clear to auscultation bilaterally, normal work of breathing GI: soft, nontender, nondistended, + BS MS: no deformity or atrophy Skin: warm and dry  Neuro:  Strength and sensation are intact Psych: euthymic mood, full affect  EKG:  EKG is ordered today. The ekg ordered today shows sinus rhythm 73 bpm, PR 176 msec, QRS 82 msec, Qtc 436 msec   Recent Labs: 09/07/2015: Magnesium 2.0; TSH 1.279 10/10/2015: ALT 48; BUN 10.2; Creatinine 0.8; HGB 13.0; Platelets 208; Potassium 4.4; Sodium 143    Lipid Panel     Component Value Date/Time   CHOL 97 09/08/2015 0225   TRIG 40 09/08/2015 0225   HDL 31* 09/08/2015 0225   CHOLHDL 3.1 09/08/2015 0225   VLDL 8 09/08/2015 0225   LDLCALC 58 09/08/2015 0225     Wt Readings from Last 3 Encounters:  10/11/15 140 lb 9.6 oz (63.776 kg)  10/10/15 141 lb 8 oz (64.184 kg)  10/09/15 141 lb 9.6 oz (64.229 kg)      Other studies Reviewed: Additional studies/ records that were reviewed today include: Dr Curt Bears' notes advised,   Review of the above records today demonstrates: prior ekgs, short RP SVT   ASSESSMENT AND PLAN:  1.  SVT The patient has symptomatic short to mid RP SVT.  She does not recall ever receiving adenosine. Therapeutic strategies for supraventricular tachycardia including medicine and ablation were discussed in detail with the patient today. Risk, benefits, and alternatives to EP study and radiofrequency ablation were also discussed in detail today. These risks include but are not limited to stroke, bleeding, vascular  damage, tamponade, perforation, damage to the heart and other structures, AV block requiring pacemaker, worsening renal function, and death. The patient understands these risk and wishes to proceed.  We will therefore proceed with catheter ablation at the next available time.  I have requested carto and anesthesia for the procedure. The patient is instructed to hold flecainide for 48 hours prior to ablation.  She is making plans with her oncologist to hold herceptin prior to ablation which is scheduled for 11/28/15.   Labs/ tests ordered today include:  Orders Placed This Encounter  Procedures  . Basic metabolic panel  . CBC with Differential  . EKG 12-Lead     Signed, Thompson Grayer, MD  10/11/2015 5:32 PM     [address] [place] [address] [phone] (office) [phone] (fax)

## 2015-10-12 ENCOUNTER — Telehealth: Payer: Self-pay | Admitting: Internal Medicine

## 2015-10-12 ENCOUNTER — Ambulatory Visit
Admission: RE | Admit: 2015-10-12 | Discharge: 2015-10-12 | Disposition: A | Discharge status: Home / Self Care | Payer: Managed Care, Other (non HMO) | Source: Ambulatory Visit | Attending: Radiation Oncology | Admitting: Radiation Oncology

## 2015-10-12 DIAGNOSIS — C50411 Malignant neoplasm of upper-outer quadrant of right female breast: Secondary | ICD-10-CM | POA: Diagnosis not present

## 2015-10-12 NOTE — Telephone Encounter (Signed)
New Message:  Pt is calling you back to talk about the scheduled time for the ablation. Please f/u with her

## 2015-10-12 NOTE — Telephone Encounter (Signed)
Spoke to patient when she got home and spoke to her husband he is worried the ablation is to close to thier daughters wedding.  We have moved to after the wedding.  11/28/15 at 5:30am  Labs 11/21/15 at 8:30

## 2015-10-13 ENCOUNTER — Ambulatory Visit
Admission: RE | Admit: 2015-10-13 | Discharge: 2015-10-13 | Disposition: A | Discharge status: Home / Self Care | Payer: Managed Care, Other (non HMO) | Source: Ambulatory Visit | Attending: Radiation Oncology | Admitting: Radiation Oncology

## 2015-10-13 ENCOUNTER — Other Ambulatory Visit: Payer: Self-pay | Admitting: *Deleted

## 2015-10-13 ENCOUNTER — Encounter: Payer: Self-pay | Admitting: Radiation Oncology

## 2015-10-13 VITALS — BP 117/64 | HR 76 | Temp 98.5°F | Ht 65.0 in | Wt 140.5 lb

## 2015-10-13 DIAGNOSIS — C50411 Malignant neoplasm of upper-outer quadrant of right female breast: Secondary | ICD-10-CM | POA: Insufficient documentation

## 2015-10-13 DIAGNOSIS — Z923 Personal history of irradiation: Secondary | ICD-10-CM | POA: Insufficient documentation

## 2015-10-13 MED ORDER — RADIAPLEXRX EX GEL
Freq: Once | CUTANEOUS | Status: AC
Start: 2015-10-13 — End: 2015-10-13
  Administered 2015-10-13: 12:00:00 via TOPICAL

## 2015-10-13 NOTE — Progress Notes (Signed)
Catherine Lawson has completed 14 fractions to her right breast.  She denies having pain or fatigue.  She is using radiaplex and has been given a refill.  The skin on her right breast is intact.  BP 117/64 mmHg  Pulse 76  Temp(Src) 98.5 F (36.9 C) (Oral)  Ht 5\' 5"  (1.651 m)  Wt 140 lb 8 oz (63.73 kg)  BMI 23.38 kg/m2

## 2015-10-13 NOTE — Progress Notes (Signed)
Pt left message wanting to inform MD " my ablation is scheduled for 5/16 "  Catherine Lawson also requested a prescription for a compression sleeve to be sent to " [facility] ".  Compression sleeve order faxed and above date will be given to MD.

## 2015-10-13 NOTE — Progress Notes (Signed)
   Department of Radiation Oncology  Phone:  [phone] Fax:        [phone]  Weekly Treatment Note    Name: Catherine Lawson Date: 10/13/2015 MRN: [id number] DOB: May 28, 1956   Diagnosis:     ICD-9-CM ICD-10-CM   1. Breast cancer of upper-outer quadrant of right female breast (HCC) 174.4 C50.411 hyaluronate sodium (RADIAPLEXRX) gel     Current dose: 25.2 Gy  Current fraction: 14   MEDICATIONS: Current Outpatient Prescriptions  Medication Sig Dispense Refill  . cetirizine (ZYRTEC) 10 MG tablet Take 10 mg by mouth daily.    . flecainide (TAMBOCOR) 50 MG tablet Take 1 tablet (50 mg total) by mouth 2 (two) times daily. 60 tablet 1  . hyaluronate sodium (RADIAPLEXRX) GEL Apply 1 application topically 2 (two) times daily.    . non-metallic deodorant Jethro Poling) MISC Apply 1 application topically daily as needed.     No current facility-administered medications for this encounter.     ALLERGIES: Review of patient's allergies indicates no known allergies.   LABORATORY DATA:  Lab Results  Component Value Date   WBC 3.9 10/10/2015   HGB 13.0 10/10/2015   HCT 39.6 10/10/2015   MCV 89.7 10/10/2015   PLT 208 10/10/2015   Lab Results  Component Value Date   NA 143 10/10/2015   K 4.4 10/10/2015   CL 114* 09/08/2015   CO2 24 10/10/2015   Lab Results  Component Value Date   ALT 48 10/10/2015   AST 44* 10/10/2015   ALKPHOS 104 10/10/2015   BILITOT 0.54 10/10/2015     NARRATIVE: Catherine Lawson was seen today for weekly treatment management. The chart was checked and the patient's films were reviewed.  Catherine Lawson has completed 14 fractions to her right breast.  She denies having pain or fatigue.  She is using radiaplex and has been given a refill.  The skin on her right breast is intact.  BP 117/64 mmHg  Pulse 76  Temp(Src) 98.5 F (36.9 C) (Oral)  Ht 5\' 5"  (1.651 m)  Wt 140 lb 8 oz (63.73 kg)  BMI 23.38 kg/m2  PHYSICAL EXAMINATION: height is 5\' 5"  (1.651  m) and weight is 140 lb 8 oz (63.73 kg). Her oral temperature is 98.5 F (36.9 C). Her blood pressure is 117/64 and her pulse is 76.        ASSESSMENT: The patient is doing satisfactorily with treatment.  PLAN: We will continue with the patient's radiation treatment as planned.

## 2015-10-16 ENCOUNTER — Ambulatory Visit
Admission: RE | Admit: 2015-10-16 | Discharge: 2015-10-16 | Disposition: A | Discharge status: Home / Self Care | Payer: Managed Care, Other (non HMO) | Source: Ambulatory Visit | Attending: Radiation Oncology | Admitting: Radiation Oncology

## 2015-10-16 DIAGNOSIS — C50411 Malignant neoplasm of upper-outer quadrant of right female breast: Secondary | ICD-10-CM | POA: Diagnosis not present

## 2015-10-17 ENCOUNTER — Ambulatory Visit: Payer: Managed Care, Other (non HMO) | Admitting: Cardiology

## 2015-10-17 ENCOUNTER — Ambulatory Visit
Admission: RE | Admit: 2015-10-17 | Discharge: 2015-10-17 | Disposition: A | Discharge status: Home / Self Care | Payer: Managed Care, Other (non HMO) | Source: Ambulatory Visit | Attending: Radiation Oncology | Admitting: Radiation Oncology

## 2015-10-17 DIAGNOSIS — C50411 Malignant neoplasm of upper-outer quadrant of right female breast: Secondary | ICD-10-CM | POA: Diagnosis not present

## 2015-10-18 ENCOUNTER — Ambulatory Visit
Admission: RE | Admit: 2015-10-18 | Discharge: 2015-10-18 | Disposition: A | Discharge status: Home / Self Care | Payer: Managed Care, Other (non HMO) | Source: Ambulatory Visit | Attending: Radiation Oncology | Admitting: Radiation Oncology

## 2015-10-18 DIAGNOSIS — C50411 Malignant neoplasm of upper-outer quadrant of right female breast: Secondary | ICD-10-CM | POA: Diagnosis not present

## 2015-10-19 ENCOUNTER — Encounter: Payer: Self-pay | Admitting: Radiation Oncology

## 2015-10-19 ENCOUNTER — Ambulatory Visit
Admission: RE | Admit: 2015-10-19 | Discharge: 2015-10-19 | Disposition: A | Discharge status: Home / Self Care | Payer: Managed Care, Other (non HMO) | Source: Ambulatory Visit | Attending: Radiation Oncology | Admitting: Radiation Oncology

## 2015-10-19 VITALS — BP 105/66 | HR 70 | Temp 98.0°F | Resp 16 | Wt 140.0 lb

## 2015-10-19 DIAGNOSIS — C50411 Malignant neoplasm of upper-outer quadrant of right female breast: Secondary | ICD-10-CM | POA: Diagnosis not present

## 2015-10-19 NOTE — Progress Notes (Signed)
Weekly rad txs 18/33 completed right breast,mild erythema only,skin intact, using radiaplex bid, exercising daily, appetite good, energy level good, no pain BP 105/66 mmHg  Pulse 70  Temp(Src) 98 F (36.7 C) (Oral)  Resp 16  Wt 140 lb (63.504 kg)  Wt Readings from Last 3 Encounters:  10/19/15 140 lb (63.504 kg)  10/13/15 140 lb 8 oz (63.73 kg)  10/11/15 140 lb 9.6 oz (63.776 kg)   11:58 AM

## 2015-10-19 NOTE — Progress Notes (Signed)
  Radiation Oncology         [phone]   Name: Catherine Lawson MRN: [id number]   Date: 10/19/2015  DOB: [birth year]   Weekly Radiation Therapy Management   Current Dose: 32.4 Gy  Planned Dose:  60.4 Gy  Narrative The patient presents for routine under treatment assessment.  Weekly rad txs 18/33 completed to the right breast. She reports mild erythema only. Her skin is intact. She is using radiaplex bid, exercising daily, and reports a good appetite. Her energy level is good and she denies any pain.  Set-up films were reviewed. The chart was checked.  Physical Findings  weight is 140 lb (63.504 kg). Her oral temperature is 98 F (36.7 C). Her blood pressure is 105/66 and her pulse is 70. Her respiration is 16.  Weight essentially stable.  No significant changes.  Impression The patient is tolerating radiation.  Plan Continue treatment as planned and continue seeing Dr. Lisbeth Renshaw.      Sheral Apley Tammi Klippel, M.D.  This document serves as a record of services personally performed by Catherine Pita, MD. It was created on his behalf by Jenell Milliner, a trained medical scribe. The creation of this record is based on the scribe's personal observations and the provider's statements to them. This document has been checked and approved by the attending provider.

## 2015-10-20 ENCOUNTER — Ambulatory Visit
Admission: RE | Admit: 2015-10-20 | Discharge: 2015-10-20 | Disposition: A | Discharge status: Home / Self Care | Payer: Managed Care, Other (non HMO) | Source: Ambulatory Visit | Attending: Radiation Oncology | Admitting: Radiation Oncology

## 2015-10-20 ENCOUNTER — Encounter: Payer: Self-pay | Admitting: Oncology

## 2015-10-20 ENCOUNTER — Other Ambulatory Visit: Payer: Self-pay | Admitting: Oncology

## 2015-10-20 DIAGNOSIS — C50411 Malignant neoplasm of upper-outer quadrant of right female breast: Secondary | ICD-10-CM | POA: Diagnosis not present

## 2015-10-23 ENCOUNTER — Ambulatory Visit
Admission: RE | Admit: 2015-10-23 | Discharge: 2015-10-23 | Disposition: A | Discharge status: Home / Self Care | Payer: Managed Care, Other (non HMO) | Source: Ambulatory Visit | Attending: Radiation Oncology | Admitting: Radiation Oncology

## 2015-10-23 DIAGNOSIS — C50411 Malignant neoplasm of upper-outer quadrant of right female breast: Secondary | ICD-10-CM | POA: Diagnosis not present

## 2015-10-24 ENCOUNTER — Encounter: Payer: Self-pay | Admitting: Radiation Oncology

## 2015-10-24 ENCOUNTER — Ambulatory Visit
Admission: RE | Admit: 2015-10-24 | Discharge: 2015-10-24 | Disposition: A | Discharge status: Home / Self Care | Payer: Managed Care, Other (non HMO) | Source: Ambulatory Visit | Attending: Radiation Oncology | Admitting: Radiation Oncology

## 2015-10-24 DIAGNOSIS — C50411 Malignant neoplasm of upper-outer quadrant of right female breast: Secondary | ICD-10-CM | POA: Diagnosis not present

## 2015-10-25 ENCOUNTER — Ambulatory Visit
Admission: RE | Admit: 2015-10-25 | Discharge: 2015-10-25 | Disposition: A | Discharge status: Home / Self Care | Payer: Managed Care, Other (non HMO) | Source: Ambulatory Visit | Attending: Radiation Oncology | Admitting: Radiation Oncology

## 2015-10-25 DIAGNOSIS — C50411 Malignant neoplasm of upper-outer quadrant of right female breast: Secondary | ICD-10-CM | POA: Diagnosis not present

## 2015-10-26 ENCOUNTER — Ambulatory Visit
Admission: RE | Admit: 2015-10-26 | Discharge: 2015-10-26 | Disposition: A | Discharge status: Home / Self Care | Payer: Managed Care, Other (non HMO) | Source: Ambulatory Visit | Attending: Radiation Oncology | Admitting: Radiation Oncology

## 2015-10-26 DIAGNOSIS — C50411 Malignant neoplasm of upper-outer quadrant of right female breast: Secondary | ICD-10-CM | POA: Diagnosis not present

## 2015-10-27 ENCOUNTER — Ambulatory Visit
Admission: RE | Admit: 2015-10-27 | Discharge: 2015-10-27 | Disposition: A | Discharge status: Home / Self Care | Payer: Managed Care, Other (non HMO) | Source: Ambulatory Visit | Attending: Radiation Oncology | Admitting: Radiation Oncology

## 2015-10-27 DIAGNOSIS — C50411 Malignant neoplasm of upper-outer quadrant of right female breast: Secondary | ICD-10-CM

## 2015-10-27 NOTE — Progress Notes (Signed)
Pt seen in the back linac table #2 by MD not sent to nursing for assessment 12:34 PM

## 2015-10-30 ENCOUNTER — Other Ambulatory Visit: Payer: Self-pay | Admitting: Nurse Practitioner

## 2015-10-30 ENCOUNTER — Other Ambulatory Visit: Payer: Self-pay | Admitting: *Deleted

## 2015-10-30 ENCOUNTER — Ambulatory Visit
Admission: RE | Admit: 2015-10-30 | Discharge: 2015-10-30 | Disposition: A | Discharge status: Home / Self Care | Payer: Managed Care, Other (non HMO) | Source: Ambulatory Visit | Attending: Radiation Oncology | Admitting: Radiation Oncology

## 2015-10-30 DIAGNOSIS — C50411 Malignant neoplasm of upper-outer quadrant of right female breast: Secondary | ICD-10-CM

## 2015-10-30 NOTE — Progress Notes (Signed)
   Department of Radiation Oncology  Phone:  [phone] Fax:        [phone]  Weekly Treatment Note    Name: Catherine Lawson Date: 10/30/2015 MRN: [id number] DOB: 31-Oct-1955   Diagnosis:     ICD-9-CM ICD-10-CM   1. Breast cancer of upper-outer quadrant of right female breast ([place]) 174.4 C50.411      Current dose: 43.2 Gy  Current fraction: 24   MEDICATIONS: Current Outpatient Prescriptions  Medication Sig Dispense Refill  . cetirizine (ZYRTEC) 10 MG tablet Take 10 mg by mouth daily.    . flecainide (TAMBOCOR) 50 MG tablet Take 1 tablet (50 mg total) by mouth 2 (two) times daily. 60 tablet 1  . hyaluronate sodium (RADIAPLEXRX) GEL Apply 1 application topically 2 (two) times daily.    . non-metallic deodorant Jethro Poling) MISC Apply 1 application topically daily as needed.     No current facility-administered medications for this encounter.     ALLERGIES: Review of patient's allergies indicates no known allergies.   LABORATORY DATA:  Lab Results  Component Value Date   WBC 3.9 10/10/2015   HGB 13.0 10/10/2015   HCT 39.6 10/10/2015   MCV 89.7 10/10/2015   PLT 208 10/10/2015   Lab Results  Component Value Date   NA 143 10/10/2015   K 4.4 10/10/2015   CL 114* 09/08/2015   CO2 24 10/10/2015   Lab Results  Component Value Date   ALT 48 10/10/2015   AST 44* 10/10/2015   ALKPHOS 104 10/10/2015   BILITOT 0.54 10/10/2015     NARRATIVE: [name] was seen today for weekly treatment management. The chart was checked and the patient's films were reviewed.  The patient is doing well this week. No major change and irritation. She continues to use skin cream daily.  PHYSICAL EXAMINATION: vitals were not taken for this visit.     The patient scan looks quite good without any significant difficulties in terms of desquamation. She will begin her boost soon.  ASSESSMENT: The patient is doing satisfactorily with treatment.  PLAN: We will continue with the  patient's radiation treatment as planned.

## 2015-10-31 ENCOUNTER — Ambulatory Visit: Payer: Managed Care, Other (non HMO)

## 2015-10-31 ENCOUNTER — Other Ambulatory Visit (HOSPITAL_BASED_OUTPATIENT_CLINIC_OR_DEPARTMENT_OTHER): Payer: Managed Care, Other (non HMO)

## 2015-10-31 ENCOUNTER — Ambulatory Visit
Admission: RE | Admit: 2015-10-31 | Discharge: 2015-10-31 | Disposition: A | Discharge status: Home / Self Care | Payer: Managed Care, Other (non HMO) | Source: Ambulatory Visit | Attending: Radiation Oncology | Admitting: Radiation Oncology

## 2015-10-31 ENCOUNTER — Other Ambulatory Visit: Payer: Self-pay | Admitting: Oncology

## 2015-10-31 ENCOUNTER — Ambulatory Visit (HOSPITAL_BASED_OUTPATIENT_CLINIC_OR_DEPARTMENT_OTHER): Payer: Managed Care, Other (non HMO)

## 2015-10-31 ENCOUNTER — Other Ambulatory Visit: Payer: Self-pay | Admitting: *Deleted

## 2015-10-31 VITALS — BP 123/68 | HR 65 | Temp 98.4°F | Resp 16

## 2015-10-31 VITALS — BP 110/47 | HR 67 | Temp 97.1°F | Resp 18

## 2015-10-31 DIAGNOSIS — C50411 Malignant neoplasm of upper-outer quadrant of right female breast: Secondary | ICD-10-CM

## 2015-10-31 DIAGNOSIS — C50912 Malignant neoplasm of unspecified site of left female breast: Secondary | ICD-10-CM

## 2015-10-31 DIAGNOSIS — I471 Supraventricular tachycardia: Secondary | ICD-10-CM

## 2015-10-31 DIAGNOSIS — Z8041 Family history of malignant neoplasm of ovary: Secondary | ICD-10-CM

## 2015-10-31 DIAGNOSIS — Z5112 Encounter for antineoplastic immunotherapy: Secondary | ICD-10-CM

## 2015-10-31 DIAGNOSIS — Z800 Family history of malignant neoplasm of digestive organs: Secondary | ICD-10-CM

## 2015-10-31 DIAGNOSIS — Z1379 Encounter for other screening for genetic and chromosomal anomalies: Secondary | ICD-10-CM

## 2015-10-31 DIAGNOSIS — Z803 Family history of malignant neoplasm of breast: Secondary | ICD-10-CM

## 2015-10-31 LAB — COMPREHENSIVE METABOLIC PANEL
ALBUMIN: 3.7 g/dL (ref 3.5–5.0)
ALK PHOS: 128 U/L (ref 40–150)
ALT: 24 U/L (ref 0–55)
ANION GAP: 8 meq/L (ref 3–11)
AST: 23 U/L (ref 5–34)
BUN: 15.8 mg/dL (ref 7.0–26.0)
CALCIUM: 9.1 mg/dL (ref 8.4–10.4)
CO2: 25 mEq/L (ref 22–29)
Chloride: 108 mEq/L (ref 98–109)
Creatinine: 0.8 mg/dL (ref 0.6–1.1)
EGFR: 76 mL/min/{1.73_m2} — ABNORMAL LOW (ref >90)
Glucose: 89 mg/dl (ref 70–140)
POTASSIUM: 4.5 meq/L (ref 3.5–5.1)
Sodium: 141 mEq/L (ref 136–145)
Total Bilirubin: 0.51 mg/dL (ref 0.20–1.20)
Total Protein: 6.5 g/dL (ref 6.4–8.3)

## 2015-10-31 LAB — CBC WITH DIFFERENTIAL/PLATELET
BASO%: 1.3 % (ref 0.0–2.0)
BASOS ABS: 0.1 10*3/uL (ref 0.0–0.1)
EOS ABS: 0.2 10*3/uL (ref 0.0–0.5)
EOS%: 5 % (ref 0.0–7.0)
HEMATOCRIT: 42.5 % (ref 34.8–46.6)
HEMOGLOBIN: 14.2 g/dL (ref 11.6–15.9)
LYMPH#: 1.2 10*3/uL (ref 0.9–3.3)
LYMPH%: 31.2 % (ref 14.0–49.7)
MCH: 29.1 pg (ref 25.1–34.0)
MCHC: 33.3 g/dL (ref 31.5–36.0)
MCV: 87.3 fL (ref 79.5–101.0)
MONO#: 0.3 10*3/uL (ref 0.1–0.9)
MONO%: 7.9 % (ref 0.0–14.0)
NEUT#: 2 10*3/uL (ref 1.5–6.5)
NEUT%: 54.6 % (ref 38.4–76.8)
PLATELETS: 202 10*3/uL (ref 145–400)
RBC: 4.87 10*6/uL (ref 3.70–5.45)
RDW: 13.9 % (ref 11.2–14.5)
WBC: 3.7 10*3/uL — ABNORMAL LOW (ref 3.9–10.3)

## 2015-10-31 MED ORDER — SODIUM CHLORIDE 0.9 % IV SOLN
Freq: Once | INTRAVENOUS | Status: AC
  Administered 2015-10-31: 09:00:00 via INTRAVENOUS

## 2015-10-31 MED ORDER — SODIUM CHLORIDE 0.9 % IJ SOLN
10.0000 mL | Freq: Once | INTRAMUSCULAR | Status: AC
  Administered 2015-10-31: 10 mL
  Filled 2015-10-31: qty 10

## 2015-10-31 MED ORDER — SODIUM CHLORIDE 0.9 % IJ SOLN
10.0000 mL | INTRAMUSCULAR | Status: DC | PRN
  Administered 2015-10-31: 10 mL
  Filled 2015-10-31: qty 10

## 2015-10-31 MED ORDER — DIPHENHYDRAMINE HCL 25 MG PO CAPS
ORAL_CAPSULE | ORAL | Status: AC
  Filled 2015-10-31: qty 1

## 2015-10-31 MED ORDER — TRASTUZUMAB CHEMO INJECTION 440 MG
6.0000 mg/kg | Freq: Once | INTRAVENOUS | Status: AC
  Administered 2015-10-31: 399 mg via INTRAVENOUS
  Filled 2015-10-31: qty 19

## 2015-10-31 MED ORDER — ACETAMINOPHEN 325 MG PO TABS
650.0000 mg | ORAL_TABLET | Freq: Once | ORAL | Status: AC
  Administered 2015-10-31: 650 mg via ORAL

## 2015-10-31 MED ORDER — DIPHENHYDRAMINE HCL 25 MG PO CAPS
25.0000 mg | ORAL_CAPSULE | Freq: Once | ORAL | Status: AC
  Administered 2015-10-31: 25 mg via ORAL

## 2015-10-31 MED ORDER — ACETAMINOPHEN 325 MG PO TABS
ORAL_TABLET | ORAL | Status: AC
  Filled 2015-10-31: qty 2

## 2015-10-31 MED ORDER — HEPARIN SOD (PORK) LOCK FLUSH 100 UNIT/ML IV SOLN
500.0000 [IU] | Freq: Once | INTRAVENOUS | Status: AC | PRN
  Administered 2015-10-31: 500 [IU]
  Filled 2015-10-31: qty 5

## 2015-10-31 NOTE — Patient Instructions (Signed)
Trastuzumab injection for infusion What is this medicine? TRASTUZUMAB (tras TOO zoo mab) is a monoclonal antibody. It is used to treat breast cancer and stomach cancer. This medicine may be used for other purposes; ask your health care provider or pharmacist if you have questions. What should I tell my health care provider before I take this medicine? They need to know if you have any of these conditions: -heart disease -heart failure -infection (especially a virus infection such as chickenpox, cold sores, or herpes) -lung or breathing disease, like asthma -recent or ongoing radiation therapy -an unusual or allergic reaction to trastuzumab, benzyl alcohol, or other medications, foods, dyes, or preservatives -pregnant or trying to get pregnant -breast-feeding How should I use this medicine? This drug is given as an infusion into a vein. It is administered in a hospital or clinic by a specially trained health care professional. Talk to your pediatrician regarding the use of this medicine in children. This medicine is not approved for use in children. Overdosage: If you think you have taken too much of this medicine contact a poison control center or emergency room at once. NOTE: This medicine is only for you. Do not share this medicine with others. What if I miss a dose? It is important not to miss a dose. Call your doctor or health care professional if you are unable to keep an appointment. What may interact with this medicine? -doxorubicin -warfarin This list may not describe all possible interactions. Give your health care provider a list of all the medicines, herbs, non-prescription drugs, or dietary supplements you use. Also tell them if you smoke, drink alcohol, or use illegal drugs. Some items may interact with your medicine. What should I watch for while using this medicine? Visit your doctor for checks on your progress. Report any side effects. Continue your course of treatment even  though you feel ill unless your doctor tells you to stop. Call your doctor or health care professional for advice if you get a fever, chills or sore throat, or other symptoms of a cold or flu. Do not treat yourself. Try to avoid being around people who are sick. You may experience fever, chills and shaking during your first infusion. These effects are usually mild and can be treated with other medicines. Report any side effects during the infusion to your health care professional. Fever and chills usually do not happen with later infusions. Do not become pregnant while taking this medicine or for 7 months after stopping it. Women should inform their doctor if they wish to become pregnant or think they might be pregnant. Women of child-bearing potential will need to have a negative pregnancy test before starting this medicine. There is a potential for serious side effects to an unborn child. Talk to your health care professional or pharmacist for more information. Do not breast-feed an infant while taking this medicine or for 7 months after stopping it. Women must use effective birth control with this medicine. What side effects may I notice from receiving this medicine? Side effects that you should report to your doctor or other health care professional as soon as possible: -breathing difficulties -chest pain or palpitations -cough -dizziness or fainting -fever or chills, sore throat -skin rash, itching or hives -swelling of the legs or ankles -unusually weak or tired Side effects that usually do not require medical attention (report to your doctor or other health care professional if they continue or are bothersome): -loss of appetite -headache -muscle aches -nausea This   list may not describe all possible side effects. Call your doctor for medical advice about side effects. You may report side effects to FDA at 1-800-FDA-1088. Where should I keep my medicine? This drug is given in a hospital  or clinic and will not be stored at home. NOTE: This sheet is a summary. It may not cover all possible information. If you have questions about this medicine, talk to your doctor, pharmacist, or health care provider.    2016, Elsevier/Gold Standard. (2014-10-07 11:49:32)   

## 2015-11-01 ENCOUNTER — Ambulatory Visit
Admission: RE | Admit: 2015-11-01 | Discharge: 2015-11-01 | Disposition: A | Discharge status: Home / Self Care | Payer: Managed Care, Other (non HMO) | Source: Ambulatory Visit | Attending: Radiation Oncology | Admitting: Radiation Oncology

## 2015-11-01 ENCOUNTER — Telehealth: Payer: Self-pay | Admitting: Nurse Practitioner

## 2015-11-01 ENCOUNTER — Other Ambulatory Visit: Payer: Self-pay | Admitting: Oncology

## 2015-11-01 DIAGNOSIS — C50411 Malignant neoplasm of upper-outer quadrant of right female breast: Secondary | ICD-10-CM | POA: Diagnosis not present

## 2015-11-01 NOTE — Telephone Encounter (Signed)
Rx request sent to pharmacy.  

## 2015-11-01 NOTE — Telephone Encounter (Signed)
left a message confirming appt change on 5/30

## 2015-11-02 ENCOUNTER — Ambulatory Visit (HOSPITAL_COMMUNITY)
Admission: RE | Admit: 2015-11-02 | Discharge: 2015-11-02 | Disposition: A | Discharge status: Home / Self Care | Payer: Managed Care, Other (non HMO) | Source: Ambulatory Visit | Attending: Family Medicine | Admitting: Family Medicine

## 2015-11-02 ENCOUNTER — Ambulatory Visit (HOSPITAL_BASED_OUTPATIENT_CLINIC_OR_DEPARTMENT_OTHER)
Admission: RE | Admit: 2015-11-02 | Discharge: 2015-11-02 | Disposition: A | Discharge status: Home / Self Care | Payer: Managed Care, Other (non HMO) | Source: Ambulatory Visit | Attending: Cardiology | Admitting: Cardiology

## 2015-11-02 ENCOUNTER — Ambulatory Visit
Admission: RE | Admit: 2015-11-02 | Discharge: 2015-11-02 | Disposition: A | Discharge status: Home / Self Care | Payer: Managed Care, Other (non HMO) | Source: Ambulatory Visit | Attending: Radiation Oncology | Admitting: Radiation Oncology

## 2015-11-02 ENCOUNTER — Encounter (HOSPITAL_COMMUNITY): Payer: Self-pay

## 2015-11-02 VITALS — BP 116/70 | HR 61 | Wt 137.0 lb

## 2015-11-02 DIAGNOSIS — I351 Nonrheumatic aortic (valve) insufficiency: Secondary | ICD-10-CM | POA: Insufficient documentation

## 2015-11-02 DIAGNOSIS — C50411 Malignant neoplasm of upper-outer quadrant of right female breast: Secondary | ICD-10-CM | POA: Diagnosis present

## 2015-11-02 DIAGNOSIS — I340 Nonrheumatic mitral (valve) insufficiency: Secondary | ICD-10-CM | POA: Insufficient documentation

## 2015-11-02 DIAGNOSIS — I471 Supraventricular tachycardia: Secondary | ICD-10-CM | POA: Diagnosis not present

## 2015-11-02 NOTE — Progress Notes (Signed)
  Echocardiogram 2D Echocardiogram has been performed.  Catherine Lawson M 11/02/2015, 10:52 AM

## 2015-11-02 NOTE — Patient Instructions (Signed)
We will contact you in 3 months to schedule your next appointment.  

## 2015-11-03 ENCOUNTER — Ambulatory Visit
Admission: RE | Admit: 2015-11-03 | Discharge: 2015-11-03 | Disposition: A | Discharge status: Home / Self Care | Payer: Managed Care, Other (non HMO) | Source: Ambulatory Visit | Attending: Radiation Oncology | Admitting: Radiation Oncology

## 2015-11-03 VITALS — BP 108/44 | HR 74 | Temp 98.3°F | Resp 12 | Wt 139.5 lb

## 2015-11-03 DIAGNOSIS — C50411 Malignant neoplasm of upper-outer quadrant of right female breast: Secondary | ICD-10-CM | POA: Diagnosis not present

## 2015-11-03 NOTE — Progress Notes (Signed)
PAIN: She is currently in no pain.  SKIN: Pt right breast- positive for Pruritus and erythema.  Pt denies edema.  Pt continues to apply Radiaplex and Hydrocortisone as directed. OTHER: Energy is good. BP 108/44 mmHg  Pulse 74  Temp(Src) 98.3 F (36.8 C) (Oral)  Resp 12  Wt 139 lb 8 oz (63.277 kg)  SpO2 99% Wt Readings from Last 3 Encounters:  11/03/15 139 lb 8 oz (63.277 kg)  11/02/15 137 lb (62.143 kg)  10/19/15 140 lb (63.504 kg)

## 2015-11-03 NOTE — Progress Notes (Signed)
  Radiation Oncology         [phone]) [phone] ________________________________  Name: Catherine Lawson MRN: [id number]  Date: 10/24/2015  DOB: 06-16-1956  Complex simulation note  The patient has undergone complex simulation for her upcoming boost treatment for her diagnosis of breast cancer. The patient has initially been planned to receive 50.4 Gy. The patient will now receive a 10 Gy boost to the seroma cavity which has been contoured. This will be accomplished using an en face electron field. Based on the depth of the target area, 9 and 12 MeV electrons will be used. The patient's final total dose therefore will be 60.4 Gy. A complex isodose plan from the electron Monte Carlo calculations is requested for the boost treatment.   _______________________________  Jodelle Gross, MD, PhD

## 2015-11-03 NOTE — Progress Notes (Signed)
   Department of Radiation Oncology  Phone:  [phone] Fax:        [phone]  Weekly Treatment Note    Name: Catherine Lawson Date: 11/03/2015 MRN: [id number] DOB: January 13, 1956   Diagnosis:     ICD-9-CM ICD-10-CM   1. Breast cancer of upper-outer quadrant of right female breast ([place]) 174.4 C50.411      Current dose: 52.4 Gy  Current fraction: 29   MEDICATIONS: Current Outpatient Prescriptions  Medication Sig Dispense Refill  . cetirizine (ZYRTEC) 10 MG tablet Take 10 mg by mouth daily.    . flecainide (TAMBOCOR) 50 MG tablet TAKE 1 TABLET (50 MG TOTAL) BY MOUTH 2 (TWO) TIMES DAILY. 60 tablet 2   No current facility-administered medications for this encounter.     ALLERGIES: Review of patient's allergies indicates no known allergies.   LABORATORY DATA:  Lab Results  Component Value Date   WBC 3.7* 10/31/2015   HGB 14.2 10/31/2015   HCT 42.5 10/31/2015   MCV 87.3 10/31/2015   PLT 202 10/31/2015   Lab Results  Component Value Date   NA 141 10/31/2015   K 4.5 10/31/2015   CL 114* 09/08/2015   CO2 25 10/31/2015   Lab Results  Component Value Date   ALT 24 10/31/2015   AST 23 10/31/2015   ALKPHOS 128 10/31/2015   BILITOT 0.51 10/31/2015     NARRATIVE: Catherine Lawson was seen today for weekly treatment management. The chart was checked and the patient's films were reviewed.  PAIN: She is currently in no pain.  SKIN: Pt right breast- positive for Pruritus and erythema.  Pt denies edema.  Pt continues to apply Radiaplex and Hydrocortisone as directed. OTHER: Energy is good. BP 108/44 mmHg  Pulse 74  Temp(Src) 98.3 F (36.8 C) (Oral)  Resp 12  Wt 139 lb 8 oz (63.277 kg)  SpO2 99% Wt Readings from Last 3 Encounters:  11/03/15 139 lb 8 oz (63.277 kg)  11/02/15 137 lb (62.143 kg)  10/19/15 140 lb (63.504 kg)     PHYSICAL EXAMINATION: weight is 139 lb 8 oz (63.277 kg). Her oral temperature is 98.3 F (36.8 C). Her blood pressure is 108/44  and her pulse is 74. Her respiration is 12 and oxygen saturation is 99%.      The patient's skin shows some hyperpigmentation/erythema. No moist desquamation.  ASSESSMENT: The patient is doing satisfactorily with treatment.  PLAN: We will continue with the patient's radiation treatment as planned.

## 2015-11-04 ENCOUNTER — Ambulatory Visit: Payer: Managed Care, Other (non HMO)

## 2015-11-04 NOTE — Progress Notes (Signed)
Patient ID: Catherine Lawson, female   DOB: Sep 16, 1955, 60 y.o.   MRN: [id number]   [facility] CONSULT NOTE  Referring Physician: Magrinat Primary Care: Theadore Nan  HPI:  Catherine Lawson is a 60 y.o. woman with no previous significant medical history who was diagnosed with  right breast cancer 02/02/2015 for a clinically multifocal T1c NX, stage IA invasive ductal carcinoma, grade 2 or 3, estrogen and progesterone receptor positive, with HER-2 not amplified and the MIB-1 at 40%. She is referred for enrollment into the cardio-oncology clinic for surveillance while getting Herceptin.   Breast CA history as follows:   (1) biopsy of a suspicious left breast mass 03/28/2015 showed only usual ductal hyperplasia, no malignancy identified.  (2) genetics testing 03/06/2015 through the Breast/Ovarian gene panel offered by [facility] found no deleterious mutations in ATM, BARD1, BRCA1, BRCA2, BRIP1, CDH1, CHEK2, EPCAM, FANCC, MLH1, MSH2, MSH6, NBN, PALB2, PMS2, PTEN, RAD51C, RAD51D, TP53, and XRCC2. (a) there was a Variant of Unknown Significance in the BRCA2 gene called [id number]  (3) status post right lumpectomy and sentinel lymph node sampling 05/29/2015 for an mpT1b pN0, stage IA invasive ductal carcinoma, grade 2, with positive margins (a) margins cleared with subsequent surgery 06/12/2015  (4) adjuvant chemotherapy to consist of cyclophosphamide and docetaxel 4, given 3 weeks apart, starting 06/27/2015.  (5) FISH panel returned HER-2 positive. Will begin trastuzumab every 3 weeks through 1 year, starting 08/08/14.  (a) echocardiogram on 07/11/15 showed an EF of 60-65%  (6) adjuvant radiation to follow chemotherapy  (7) received anastrozole 02/08/2015 through December 2016, to resume at the completion of radiation  Doing ok today.  Getting XRT.  She had runs of SVT (?AVNRT) and was initially started on Coreg but did not tolerate it well.  Subsequently put on  flecainide 50 mg bid with plans for ablation soon by Dr Catherine Lawson.  No prolonged tachyarrhythmias since starting flecainide.   Echo 07/11/15: EF 60-65% LATERAL S' 12.8 gls -19.0 Echo 4/17: EF 55-60%, lateral s' 11.6, GLS -18%, normal RV size and systolic function, mild MR.   Review of Systems: All systems reviewed and negative except as per HPI.   Past Medical History  Diagnosis Date  . Breast cancer of upper-outer quadrant of right female breast ([place]) 02/06/2015  . Breast cancer ([place])   . Family history of breast cancer   . Family history of colon cancer   . Family history of ovarian cancer   . Family history of stomach cancer   . Arthritis     bil thumb joints  . SVT (supraventricular tachycardia) (HCC)     fast HR at times due to stress, no chest pain or SOB   Social History   Social History  . Marital Status: Married    Spouse Name: Event organiser  . Number of Children: 0  . Years of Education: N/A   Occupational History  . Not on file.   Social History Main Topics  . Smoking status: Never Smoker   . Smokeless tobacco: Not on file  . Alcohol Use: Yes     Comment: social  . Drug Use: No  . Sexual Activity: Yes   Other Topics Concern  . Not on file   Social History Narrative   Lives in [place]   Retired   married   Family History  Problem Relation Age of Onset  . Breast cancer Sister 89  . Colon cancer Father 46  . Cancer Maternal Grandmother     probable ovarian  cancer  . Stomach cancer Paternal Grandmother 7  . Brain cancer Other 10    Medulloblastoma  . Parkinson's disease Mother   . Lung cancer Paternal Uncle     smoker  . Stomach cancer Other     PGMs sister  . Stomach cancer Other     PMGs brother   Current Outpatient Prescriptions  Medication Sig Dispense Refill  . cetirizine (ZYRTEC) 10 MG tablet Take 10 mg by mouth daily.    . flecainide (TAMBOCOR) 50 MG tablet TAKE 1 TABLET (50 MG TOTAL) BY MOUTH 2 (TWO) TIMES DAILY. 60 tablet 2   No current  facility-administered medications for this encounter.    No Known Allergies    Social History   Social History  . Marital Status: Married    Spouse Name: Event organiser  . Number of Children: 0  . Years of Education: N/A   Occupational History  . Not on file.   Social History Main Topics  . Smoking status: Never Smoker   . Smokeless tobacco: Not on file  . Alcohol Use: Yes     Comment: social  . Drug Use: No  . Sexual Activity: Yes   Other Topics Concern  . Not on file   Social History Narrative   Lives in [place]   Retired   married      Family History  Problem Relation Age of Onset  . Breast cancer Sister 26  . Colon cancer Father 60  . Cancer Maternal Grandmother     probable ovarian cancer  . Stomach cancer Paternal Grandmother 9  . Brain cancer Other 10    Medulloblastoma  . Parkinson's disease Mother   . Lung cancer Paternal Uncle     smoker  . Stomach cancer Other     PGMs sister  . Stomach cancer Other     PMGs brother    Filed Vitals:   11/02/15 1052  BP: 116/70  Pulse: 61  Weight: 137 lb (62.143 kg)  SpO2: 99%    PHYSICAL EXAM: General:  Well appearing. No respiratory difficulty HEENT: normal Neck: supple. no JVD. Carotids 2+ bilat; no bruits. No lymphadenopathy or thryomegaly appreciated. Cor: PMI nondisplaced. Regular rate & rhythm. No rubs, gallops or murmurs. Lungs: clear Abdomen: soft, nontender, nondistended. No hepatosplenomegaly. No bruits or masses. Good bowel sounds. Extremities: no cyanosis, clubbing, rash, edema Neuro: alert & oriented x 3, cranial nerves grossly intact. moves all 4 extremities w/o difficulty. Affect pleasant.  ECG: NSR 72. LAE No ST-T wave abnormalities. No delta wave   ASSESSMENT & PLAN: 1. Breast cancer: She will be getting Herceptin until 1/17.  No dyspnea.  Echo today was reviewed, EF is normal and strain pattern shows no significant change from prior.  She will continue Herceptin. - Return in 3 months  for echo and office visit.  2. 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  Unable to tolerate Coreg, now on flecainide with decreased palpitations.  She will be having an ablation by Dr Catherine Lawson soon.   Catherine Lawson French Ana 11/04/2015

## 2015-11-06 ENCOUNTER — Ambulatory Visit
Admission: RE | Admit: 2015-11-06 | Discharge: 2015-11-06 | Disposition: A | Discharge status: Home / Self Care | Payer: Managed Care, Other (non HMO) | Source: Ambulatory Visit | Attending: Radiation Oncology | Admitting: Radiation Oncology

## 2015-11-06 ENCOUNTER — Ambulatory Visit: Payer: Managed Care, Other (non HMO)

## 2015-11-07 ENCOUNTER — Ambulatory Visit
Admission: RE | Admit: 2015-11-07 | Discharge: 2015-11-07 | Disposition: A | Discharge status: Home / Self Care | Payer: Managed Care, Other (non HMO) | Source: Ambulatory Visit | Attending: Radiation Oncology | Admitting: Radiation Oncology

## 2015-11-07 ENCOUNTER — Ambulatory Visit: Payer: Managed Care, Other (non HMO)

## 2015-11-08 ENCOUNTER — Ambulatory Visit
Admission: RE | Admit: 2015-11-08 | Discharge: 2015-11-08 | Disposition: A | Discharge status: Home / Self Care | Payer: Managed Care, Other (non HMO) | Source: Ambulatory Visit | Attending: Radiation Oncology | Admitting: Radiation Oncology

## 2015-11-08 DIAGNOSIS — C50411 Malignant neoplasm of upper-outer quadrant of right female breast: Secondary | ICD-10-CM | POA: Diagnosis not present

## 2015-11-09 ENCOUNTER — Ambulatory Visit
Admission: RE | Admit: 2015-11-09 | Discharge: 2015-11-09 | Disposition: A | Discharge status: Home / Self Care | Payer: Managed Care, Other (non HMO) | Source: Ambulatory Visit | Attending: Radiation Oncology | Admitting: Radiation Oncology

## 2015-11-09 ENCOUNTER — Other Ambulatory Visit: Payer: Managed Care, Other (non HMO)

## 2015-11-09 ENCOUNTER — Ambulatory Visit: Payer: Managed Care, Other (non HMO)

## 2015-11-09 DIAGNOSIS — C50411 Malignant neoplasm of upper-outer quadrant of right female breast: Secondary | ICD-10-CM | POA: Diagnosis not present

## 2015-11-10 ENCOUNTER — Encounter: Payer: Self-pay | Admitting: Radiation Oncology

## 2015-11-10 ENCOUNTER — Telehealth: Payer: Self-pay | Admitting: *Deleted

## 2015-11-10 ENCOUNTER — Ambulatory Visit
Admission: RE | Admit: 2015-11-10 | Discharge: 2015-11-10 | Disposition: A | Discharge status: Home / Self Care | Payer: Managed Care, Other (non HMO) | Source: Ambulatory Visit | Attending: Radiation Oncology | Admitting: Radiation Oncology

## 2015-11-10 ENCOUNTER — Ambulatory Visit: Payer: Managed Care, Other (non HMO)

## 2015-11-10 ENCOUNTER — Encounter: Payer: Self-pay | Admitting: *Deleted

## 2015-11-10 VITALS — BP 122/68 | HR 86 | Temp 97.9°F | Resp 20 | Wt 141.3 lb

## 2015-11-10 DIAGNOSIS — C50411 Malignant neoplasm of upper-outer quadrant of right female breast: Secondary | ICD-10-CM

## 2015-11-10 NOTE — Progress Notes (Signed)
  Department of Radiation Oncology  Phone:  [phone] Fax:        [phone]  Weekly Treatment Note    Name: Catherine Lawson Date: 11/10/2015 MRN: [id number] DOB: [birth year]   Current dose: 60.4 Gy  Current fraction: 33   MEDICATIONS: Current Outpatient Prescriptions  Medication Sig Dispense Refill  . hyaluronate sodium (RADIAPLEXRX) GEL Apply 1 application topically 2 (two) times daily.    . cetirizine (ZYRTEC) 10 MG tablet Take 10 mg by mouth daily.    . flecainide (TAMBOCOR) 50 MG tablet TAKE 1 TABLET (50 MG TOTAL) BY MOUTH 2 (TWO) TIMES DAILY. 60 tablet 2   No current facility-administered medications for this encounter.     ALLERGIES: Review of patient's allergies indicates no known allergies.   LABORATORY DATA:  Lab Results  Component Value Date   WBC 3.7* 10/31/2015   HGB 14.2 10/31/2015   HCT 42.5 10/31/2015   MCV 87.3 10/31/2015   PLT 202 10/31/2015   Lab Results  Component Value Date   NA 141 10/31/2015   K 4.5 10/31/2015   CL 114* 09/08/2015   CO2 25 10/31/2015   Lab Results  Component Value Date   ALT 24 10/31/2015   AST 23 10/31/2015   ALKPHOS 128 10/31/2015   BILITOT 0.51 10/31/2015     NARRATIVE: Catherine Lawson was seen today for weekly treatment management. The chart was checked and the patient's films were reviewed.  Weekly rad xts 32/33 right breast, here before rad tx today 1st, no pain, eythema, using radiaplex bid, appetite good, energy 1 mo f/u appt made 2:52 PM BP 122/68 mmHg  Pulse 86  Temp(Src) 97.9 F (36.6 C) (Oral)  Resp 20  Wt 141 lb 4.8 oz (64.093 kg)  Wt Readings from Last 3 Encounters:  11/10/15 141 lb 4.8 oz (64.093 kg)  11/03/15 139 lb 8 oz (63.277 kg)  11/02/15 137 lb (62.143 kg)    PHYSICAL EXAMINATION: weight is 141 lb 4.8 oz (64.093 kg). Her oral temperature is 97.9 F (36.6 C). Her blood pressure is 122/68 and her pulse is 86. Her respiration is 20.      The patient's skin shows hyperpigmentation and  erythema but overall looks excellent.  ASSESSMENT: The patient did satisfactorily with treatment.  PLAN: The patient will follow-up in our clinic in 1 month.

## 2015-11-10 NOTE — Progress Notes (Signed)
Weekly rad xts 32/33 right breast, here before rad tx today 1st, no pain, eythema, using radiaplex bid, appetite good, energy 1 mo f/u appt made 11:44 AM BP 122/68 mmHg  Pulse 86  Temp(Src) 97.9 F (36.6 C) (Oral)  Resp 20  Wt 141 lb 4.8 oz (64.093 kg)  Wt Readings from Last 3 Encounters:  11/10/15 141 lb 4.8 oz (64.093 kg)  11/03/15 139 lb 8 oz (63.277 kg)  11/02/15 137 lb (62.143 kg)

## 2015-11-10 NOTE — Telephone Encounter (Signed)
error 

## 2015-11-16 ENCOUNTER — Telehealth: Payer: Self-pay | Admitting: *Deleted

## 2015-11-16 NOTE — Telephone Encounter (Signed)
Called patient to alter fu appt. From 12-19-15 to 12-26-15 @ 10:15 am, lvm for a return call

## 2015-11-21 ENCOUNTER — Ambulatory Visit: Payer: Managed Care, Other (non HMO)

## 2015-11-21 ENCOUNTER — Other Ambulatory Visit (INDEPENDENT_AMBULATORY_CARE_PROVIDER_SITE_OTHER): Payer: Managed Care, Other (non HMO) | Admitting: *Deleted

## 2015-11-21 ENCOUNTER — Other Ambulatory Visit: Payer: Managed Care, Other (non HMO)

## 2015-11-21 ENCOUNTER — Ambulatory Visit: Payer: Managed Care, Other (non HMO) | Admitting: Nurse Practitioner

## 2015-11-21 DIAGNOSIS — I471 Supraventricular tachycardia: Secondary | ICD-10-CM | POA: Diagnosis not present

## 2015-11-21 LAB — CBC WITH DIFFERENTIAL/PLATELET
BASOS ABS: 0 {cells}/uL (ref 0–200)
Basophils Relative: 0 %
EOS ABS: 94 {cells}/uL (ref 15–500)
EOS PCT: 2 %
HEMATOCRIT: 44.7 % (ref 35.0–45.0)
HEMOGLOBIN: 15.2 g/dL (ref 11.7–15.5)
LYMPHS ABS: 2068 {cells}/uL (ref 850–3900)
Lymphocytes Relative: 44 %
MCH: 29.2 pg (ref 27.0–33.0)
MCHC: 34 g/dL (ref 32.0–36.0)
MCV: 86 fL (ref 80.0–100.0)
MPV: 9.6 fL (ref 7.5–12.5)
Monocytes Absolute: 376 cells/uL (ref 200–950)
Monocytes Relative: 8 %
NEUTROS ABS: 2162 {cells}/uL (ref 1500–7800)
NEUTROS PCT: 46 %
Platelets: 248 10*3/uL (ref 140–400)
RBC: 5.2 MIL/uL — ABNORMAL HIGH (ref 3.80–5.10)
RDW: 13.7 % (ref 11.0–15.0)
WBC: 4.7 10*3/uL (ref 3.8–10.8)

## 2015-11-21 LAB — BASIC METABOLIC PANEL
BUN: 12 mg/dL (ref 7–25)
CALCIUM: 9.4 mg/dL (ref 8.6–10.4)
CO2: 25 mmol/L (ref 20–31)
Chloride: 102 mmol/L (ref 98–110)
Creat: 0.86 mg/dL (ref 0.50–1.05)
GLUCOSE: 82 mg/dL (ref 65–99)
Potassium: 4.8 mmol/L (ref 3.5–5.3)
SODIUM: 139 mmol/L (ref 135–146)

## 2015-11-27 ENCOUNTER — Telehealth: Payer: Self-pay | Admitting: Internal Medicine

## 2015-11-27 NOTE — Anesthesia Preprocedure Evaluation (Addendum)
Anesthesia Evaluation  Patient identified by MRN, date of birth, ID band Patient awake  General Assessment Comment:Hx of breast ca and Rx  Reviewed: Allergy & Precautions, NPO status , Patient's Chart, lab work & pertinent test results  History of Anesthesia Complications Negative for: history of anesthetic complications  Airway Mallampati: II  TM Distance: >3 FB     Dental  (+) Teeth Intact, Dental Advisory Given   Pulmonary    breath sounds clear to auscultation       Cardiovascular + dysrhythmias  Rhythm:Regular Rate:Normal     Neuro/Psych    GI/Hepatic negative GI ROS, Neg liver ROS,   Endo/Other  negative endocrine ROS  Renal/GU negative Renal ROS     Musculoskeletal   Abdominal   Peds  Hematology negative hematology ROS (+)   Anesthesia Other Findings   Reproductive/Obstetrics                            Anesthesia Physical Anesthesia Plan  ASA: II  Anesthesia Plan: General and MAC   Post-op Pain Management:    Induction: Intravenous  Airway Management Planned: LMA, Natural Airway and Simple Face Mask  Additional Equipment:   Intra-op Plan:   Post-operative Plan: Extubation in OR  Informed Consent: I have reviewed the patients History and Physical, chart, labs and discussed the procedure including the risks, benefits and alternatives for the proposed anesthesia with the patient or authorized representative who has indicated his/her understanding and acceptance.     Plan Discussed with: CRNA and Anesthesiologist  Anesthesia Plan Comments:        Anesthesia Quick Evaluation

## 2015-11-27 NOTE — Telephone Encounter (Signed)
New message    Request for surgical clearance:  What type of surgery is being performed? SVT Ablation  When is this surgery scheduled? May 16 th  Are there any medications that need to be held prior to surgery and how long? no  Name of physician performing surgery? Dr. Rayann Heman  What is your office phone and fax number? [phone]   Per Pt she is concerned about [name] did or did not approve the procedure pt did not hear back from anyone.

## 2015-11-28 ENCOUNTER — Encounter (HOSPITAL_COMMUNITY)
Admission: RE | Disposition: A | Discharge status: Home / Self Care | Payer: Self-pay | Source: Ambulatory Visit | Attending: Internal Medicine

## 2015-11-28 ENCOUNTER — Ambulatory Visit (HOSPITAL_COMMUNITY): Payer: Managed Care, Other (non HMO) | Admitting: Anesthesiology

## 2015-11-28 ENCOUNTER — Encounter (HOSPITAL_COMMUNITY): Payer: Self-pay | Admitting: Certified Registered Nurse Anesthetist

## 2015-11-28 ENCOUNTER — Ambulatory Visit (HOSPITAL_COMMUNITY)
Admission: RE | Admit: 2015-11-28 | Discharge: 2015-11-28 | Disposition: A | Discharge status: Home / Self Care | Payer: Managed Care, Other (non HMO) | Source: Ambulatory Visit | Attending: Internal Medicine | Admitting: Internal Medicine

## 2015-11-28 DIAGNOSIS — Z8679 Personal history of other diseases of the circulatory system: Secondary | ICD-10-CM

## 2015-11-28 DIAGNOSIS — Z9889 Other specified postprocedural states: Secondary | ICD-10-CM

## 2015-11-28 DIAGNOSIS — I471 Supraventricular tachycardia: Secondary | ICD-10-CM | POA: Insufficient documentation

## 2015-11-28 DIAGNOSIS — Z853 Personal history of malignant neoplasm of breast: Secondary | ICD-10-CM | POA: Diagnosis not present

## 2015-11-28 HISTORY — DX: Personal history of other diseases of the circulatory system: Z86.79

## 2015-11-28 HISTORY — PX: ELECTROPHYSIOLOGIC STUDY: SHX172A

## 2015-11-28 HISTORY — DX: Other specified postprocedural states: Z98.890

## 2015-11-28 LAB — POCT ACTIVATED CLOTTING TIME
ACTIVATED CLOTTING TIME: 188 s
ACTIVATED CLOTTING TIME: 183 s
Activated Clotting Time: 214 seconds

## 2015-11-28 SURGERY — A-FLUTTER/A-TACH/SVT ABLATION
Anesthesia: General

## 2015-11-28 MED ORDER — ATROPINE SULFATE 1 MG/10ML IJ SOSY: 1.0000 mg | PREFILLED_SYRINGE | Freq: Once | INTRAMUSCULAR | Status: DC

## 2015-11-28 MED ORDER — ONDANSETRON HCL 4 MG/2ML IJ SOLN: 4.0000 mg | Freq: Four times a day (QID) | INTRAMUSCULAR | Status: DC | PRN

## 2015-11-28 MED ORDER — HYDROCODONE-ACETAMINOPHEN 5-325 MG PO TABS
1.0000 | ORAL_TABLET | ORAL | Status: DC | PRN
Start: 2015-11-28 — End: 2015-11-28

## 2015-11-28 MED ORDER — SODIUM CHLORIDE 0.9 % IV SOLN
2.0000 ug/min | INTRAVENOUS | Status: AC
  Administered 2015-11-28: 4 ug/min via INTRAVENOUS
  Filled 2015-11-28: qty 2

## 2015-11-28 MED ORDER — SODIUM CHLORIDE 0.9 % IV SOLN
INTRAVENOUS | Status: DC
  Administered 2015-11-28 (×3): via INTRAVENOUS

## 2015-11-28 MED ORDER — PROPOFOL 500 MG/50ML IV EMUL
INTRAVENOUS | Status: DC | PRN
  Administered 2015-11-28: 50 ug/kg/min via INTRAVENOUS

## 2015-11-28 MED ORDER — ATROPINE SULFATE 1 MG/10ML IJ SOSY
PREFILLED_SYRINGE | INTRAMUSCULAR | Status: AC
  Filled 2015-11-28: qty 10

## 2015-11-28 MED ORDER — FENTANYL CITRATE (PF) 100 MCG/2ML IJ SOLN
INTRAMUSCULAR | Status: DC | PRN
  Administered 2015-11-28 (×6): 25 ug via INTRAVENOUS

## 2015-11-28 MED ORDER — PROPOFOL 10 MG/ML IV BOLUS
INTRAVENOUS | Status: DC | PRN
  Administered 2015-11-28: 15 mg via INTRAVENOUS
  Administered 2015-11-28 (×3): 20 mg via INTRAVENOUS
  Administered 2015-11-28: 10 mg via INTRAVENOUS

## 2015-11-28 MED ORDER — BUPIVACAINE HCL (PF) 0.25 % IJ SOLN
INTRAMUSCULAR | Status: DC | PRN
  Administered 2015-11-28: 30 mL

## 2015-11-28 MED ORDER — MIDAZOLAM HCL 5 MG/5ML IJ SOLN
INTRAMUSCULAR | Status: DC | PRN
  Administered 2015-11-28: 2 mg via INTRAVENOUS

## 2015-11-28 MED ORDER — ASPIRIN EC 81 MG PO TBEC: 81.0000 mg | DELAYED_RELEASE_TABLET | Freq: Every day | ORAL | Status: DC

## 2015-11-28 MED ORDER — HEPARIN SODIUM (PORCINE) 1000 UNIT/ML IJ SOLN
INTRAMUSCULAR | Status: DC | PRN
  Administered 2015-11-28: 3 mL via INTRAVENOUS
  Administered 2015-11-28: 5 mL via INTRAVENOUS

## 2015-11-28 MED ORDER — LIDOCAINE HCL (CARDIAC) 20 MG/ML IV SOLN
INTRAVENOUS | Status: DC | PRN
  Administered 2015-11-28: 60 mg via INTRAVENOUS

## 2015-11-28 MED ORDER — SODIUM CHLORIDE 0.9% FLUSH: 3.0000 mL | INTRAVENOUS | Status: DC | PRN

## 2015-11-28 MED ORDER — SODIUM CHLORIDE 0.9 % IV SOLN: 250.0000 mL | INTRAVENOUS | Status: DC | PRN

## 2015-11-28 MED ORDER — SODIUM CHLORIDE 0.9% FLUSH: 3.0000 mL | Freq: Two times a day (BID) | INTRAVENOUS | Status: DC

## 2015-11-28 MED ORDER — BUPIVACAINE HCL (PF) 0.25 % IJ SOLN
INTRAMUSCULAR | Status: AC
  Filled 2015-11-28: qty 30

## 2015-11-28 MED ORDER — ACETAMINOPHEN 325 MG PO TABS
650.0000 mg | ORAL_TABLET | ORAL | Status: DC | PRN
  Administered 2015-11-28: 650 mg via ORAL
  Filled 2015-11-28: qty 2

## 2015-11-28 SURGICAL SUPPLY — 12 items
BAG SNAP BAND KOVER 36X36 (MISCELLANEOUS) ×2 IMPLANT
BLANKET WARM UNDERBOD FULL ACC (MISCELLANEOUS) ×2 IMPLANT
CATH EZ STEER NAV 4MM D-F CUR (ABLATOR) ×1 IMPLANT
CATH HEXAPOLAR DAMATO 6F (CATHETERS) ×1 IMPLANT
CATH JOSEPHSON QUAD-ALLRED 6FR (CATHETERS) ×2 IMPLANT
PACK EP LATEX FREE (CUSTOM PROCEDURE TRAY) ×2
PACK EP LF (CUSTOM PROCEDURE TRAY) ×1 IMPLANT
PAD DEFIB LIFELINK (PAD) ×2 IMPLANT
PATCH CARTO3 (PAD) ×1 IMPLANT
SHEATH PINNACLE 6F 10CM (SHEATH) ×3 IMPLANT
SHEATH PINNACLE 8F 10CM (SHEATH) ×2 IMPLANT
SHIELD RADPAD SCOOP 12X17 (MISCELLANEOUS) ×2 IMPLANT

## 2015-11-28 NOTE — Progress Notes (Signed)
Pt bedrest ended at 17:45. Pt ambulated approximately 331ft without difficulty. Right Groin and Right IJ no bleeding or hematoma present. Pt has no c/o pain at this time. VSS. Will discharge pt.    Ruben Reason, RN

## 2015-11-28 NOTE — Transfer of Care (Signed)
Immediate Anesthesia Transfer of Care Note  Patient: Catherine Lawson  Procedure(s) Performed: Procedure(s): SVT Ablation (N/A)  Patient Location: Cath Lab  Anesthesia Type:MAC  Level of Consciousness: awake, alert  and oriented  Airway & Oxygen Therapy: Patient Spontanous Breathing and Patient connected to nasal cannula oxygen  Post-op Assessment: Report given to RN, Post -op Vital signs reviewed and stable and Patient moving all extremities X 4  Post vital signs: Reviewed and stable  Last Vitals:  Filed Vitals:   11/28/15 0602  BP: 115/68  Pulse: 76  Temp: 37.5 C  Resp: 18    Last Pain: There were no vitals filed for this visit.       Complications: No apparent anesthesia complications

## 2015-11-28 NOTE — Progress Notes (Signed)
Doing well post ablation. No concerns Groin looks good Limited echo without effusion  Stop flecainide Asa 81mg  daily x 2 weeks if ok with oncology  Follow-up with me in 4 weeks  Thompson Grayer MD, [facility] 11/28/2015 3:47 PM

## 2015-11-28 NOTE — Progress Notes (Signed)
Site area: RFA x 1/RFV x3/RT IJ Site Prior to Removal:  Level 0/0 Pressure Applied For:20 min Manual:   yes Patient Status During Pull:  BP to 65/29  HR to 42, 0.6 mg atropine given Post Pull Site:  Level 0/o Post Pull Instructions Given: yes  Post Pull Pulses Present: palpable Dressing Applied:  tegaderm Bedrest begins @ 1145 till 1745 Comments: Rapid recovery- small amt heme noted right nare

## 2015-11-28 NOTE — Progress Notes (Signed)
RFA and RFV are level 0. Pt. Given verbal instructions regarding rfa/rfv if she laughs, coughs, sneezed or bears down to hold pressure. Pt hand guided to rfa/rfv site and she verbally repeated and demonstrated how to hold if in the above mentioned situation. Dried Heme noted at r nares, call to EP and Kathlene November advised that anesthesia placed a nasal trumpet. Pt. Does not offer any complaints currently.

## 2015-11-28 NOTE — Anesthesia Postprocedure Evaluation (Signed)
Anesthesia Post Note  Patient: Catherine Lawson  Procedure(s) Performed: Procedure(s) (LRB): SVT Ablation (N/A)  Patient location during evaluation: Cath Lab Anesthesia Type: MAC Level of consciousness: awake and alert Pain management: pain level controlled Vital Signs Assessment: post-procedure vital signs reviewed and stable Respiratory status: spontaneous breathing, nonlabored ventilation, respiratory function stable and patient connected to nasal cannula oxygen Cardiovascular status: stable and blood pressure returned to baseline Anesthetic complications: no    Last Vitals:  Filed Vitals:   11/28/15 1230 11/28/15 1245  BP: 102/64 99/61  Pulse: 71 72  Temp:    Resp: 19 15    Last Pain: There were no vitals filed for this visit.               Oyuki Hogan,JAMES TERRILL

## 2015-11-28 NOTE — H&P (Signed)
History of Present Illness: Catherine Lawson is a 60 y.o. female who presents today for EPS and ablation for SVT.  She has had difficulty with SVT for quite some time. Episodes have increased in frequency and duration over the past year. Recently, she has had more than 1 episode per month. She is unaware of triggers or precipitants. She has found that she could terminated episodes with vagal maneuvers but has not received adenosine in the past that she recalls. She reports tachypalpitations and fatigue with episodes. Recently, she was unable to terminate SVT and presented to the ED. She was started on flecainide. She has had no further SVT. She does not feel that flecainide is a good long term option, but has been a good option while she has received treatment recently for breast cancer.   Today, she denies symptoms of palpitations, chest pain, shortness of breath, orthopnea, PND, lower extremity edema, claudication, dizziness, presyncope, syncope, bleeding, or neurologic sequela. The patient is tolerating medications without difficulties and is otherwise without complaint today.    Past Medical History  Diagnosis Date  . Breast cancer of upper-outer quadrant of right female breast ([place]) 02/06/2015  . Breast cancer ([place])   . Family history of breast cancer   . Family history of colon cancer   . Family history of ovarian cancer   . Family history of stomach cancer   . Arthritis     bil thumb joints  . SVT (supraventricular tachycardia) ([facility])     fast HR at times due to stress, no chest pain or SOB   Past Surgical History  Procedure Laterality Date  . Wisdom tooth extraction    . Radioactive seed guided mastectomy with axillary sentinel lymph node biopsy Right 05/29/2015    Procedure: RADIOACTIVE SEED RIGHT BREAST LUMPECTOMY WITH AXILLARY SENTINEL LYMPH NODE BIOPSY; Surgeon: Excell Seltzer, MD; Location: [place]; Service: General; Laterality: Right;  . Re-excision of breast lumpectomy Right 06/12/2015    Procedure: RE-EXCISION OF RIGHT BREAST LUMPECTOMY; Surgeon: Excell Seltzer, MD; Location: [facility]; Service: General; Laterality: Right;  . Portacath placement Right 06/27/2015    Procedure: INSERTION PORT-A-CATH; Surgeon: Excell Seltzer, MD; Location: [place]; Service: General; Laterality: Right;     Current Outpatient Prescriptions  Medication Sig Dispense Refill  . cetirizine (ZYRTEC) 10 MG tablet Take 10 mg by mouth daily.    . flecainide (TAMBOCOR) 50 MG tablet Take 1 tablet (50 mg total) by mouth 2 (two) times daily. 60 tablet 1  . hyaluronate sodium (RADIAPLEXRX) GEL Apply 1 application topically 2 (two) times daily.    . non-metallic deodorant Jethro Poling) MISC Apply 1 application topically daily as needed.     No current facility-administered medications for this visit.    Allergies: Review of patient's allergies indicates no known allergies.   Social History: The patient  reports that she has never smoked. She does not have any smokeless tobacco history on file. She reports that she drinks alcohol. She reports that she does not use illicit drugs.   Family History: The patient's family history includes Brain cancer (age of onset: 68) in her other; Breast cancer (age of onset: 21) in her sister; Cancer in her maternal grandmother; Colon cancer (age of onset: 5) in her father; Lung cancer in her paternal uncle; Parkinson's disease in her mother; Stomach cancer in her other and other; Stomach cancer (age of onset: 55) in her paternal grandmother.    ROS: Please see the history of present  illness. All other systems are reviewed and negative.    PHYSICAL EXAM: Filed Vitals:   11/28/15 0602  BP: 115/68  Pulse: 76  Temp: 99.5 F (37.5 C)  Resp: 18   GEN: Well nourished, well developed, in no acute  distress  HEENT: normal  Neck: no JVD, carotid bruits, or masses Cardiac: RRR; no murmurs, rubs, or gallops,no edema  Respiratory: clear to auscultation bilaterally, normal work of breathing GI: soft, nontender, nondistended, + BS MS: no deformity or atrophy  Skin: warm and dry  Neuro: Strength and sensation are intact Psych: euthymic mood, full affect    Recent Labs: 09/07/2015: Magnesium 2.0; TSH 1.279 10/10/2015: ALT 48; BUN 10.2; Creatinine 0.8; HGB 13.0; Platelets 208; Potassium 4.4; Sodium 143    Lipid Panel   Labs (Brief)       Component Value Date/Time   CHOL 97 09/08/2015 0225   TRIG 40 09/08/2015 0225   HDL 31* 09/08/2015 0225   CHOLHDL 3.1 09/08/2015 0225   VLDL 8 09/08/2015 0225   LDLCALC 58 09/08/2015 0225       Wt Readings from Last 3 Encounters:  10/11/15 140 lb 9.6 oz (63.776 kg)  10/10/15 141 lb 8 oz (64.184 kg)  10/09/15 141 lb 9.6 oz (64.229 kg)      Other studies Reviewed: Additional studies/ records that were reviewed today include: Dr Curt Bears' notes advised,  Review of the above records today demonstrates: prior ekgs, short RP SVT   ASSESSMENT AND PLAN:  1. SVT The patient has symptomatic short to mid RP SVT. She does not recall ever receiving adenosine. Therapeutic strategies for supraventricular tachycardia including medicine and ablation were discussed in detail with the patient today. Risk, benefits, and alternatives to EP study and radiofrequency ablation were also discussed in detail with patient and her spouse today. These risks include but are not limited to stroke, bleeding, vascular damage, tamponade, perforation, damage to the heart and other structures, AV block requiring pacemaker, worsening renal function, and death. The patient understands these risk and wishes to proceed.  Thompson Grayer MD, [facility] 11/28/2015 7:32 AM

## 2015-11-28 NOTE — Discharge Instructions (Signed)
No driving for 4 days. No lifting over 5 lbs for 1 week. No sexual activity for 1 week. You may return to work in 1 week. Keep procedure site clean & dry. If you notice increased pain, swelling, bleeding or pus, call/return!  You may shower, but no soaking baths/hot tubs/pools for 1 week.  ° ° °

## 2015-11-29 MED FILL — Atropine Sulfate Soln Prefill Syr 1 MG/10ML (0.1 MG/ML): INTRAMUSCULAR | Qty: 10 | Status: AC

## 2015-12-03 NOTE — Progress Notes (Signed)
  Radiation Oncology         [phone]) [phone] ________________________________  Name: Catherine Lawson MRN: [id number]  Date: 11/10/2015  DOB: [birth year]  End of Treatment Note  Diagnosis:   Right-sided breast cancer     Indication for treatment:  Curative       Radiation treatment dates:   09/26/2015 through 11/10/2015  Site/dose:   The patient initially received a dose of 50.4 Gy in 28 fractions to the breast using whole-breast tangent fields. This was delivered using a 3-D conformal technique. The patient then received a boost to the seroma. This delivered an additional 10 Gy in 5 fractions using a electron field. The total dose was 60.4 Gy.  Narrative: The patient tolerated radiation treatment relatively well.   The patient had some expected skin irritation as she progressed during treatment. Moist desquamation was not present at the end of treatment.  Plan: The patient has completed radiation treatment. The patient will return to radiation oncology clinic for routine followup in one month. I advised the patient to call or return sooner if they have any questions or concerns related to their recovery or treatment. ________________________________  Jodelle Gross, M.D., Ph.D.

## 2015-12-12 ENCOUNTER — Encounter: Payer: Self-pay | Admitting: Nurse Practitioner

## 2015-12-12 ENCOUNTER — Ambulatory Visit (HOSPITAL_BASED_OUTPATIENT_CLINIC_OR_DEPARTMENT_OTHER): Payer: Managed Care, Other (non HMO)

## 2015-12-12 ENCOUNTER — Encounter: Payer: Self-pay | Admitting: *Deleted

## 2015-12-12 ENCOUNTER — Other Ambulatory Visit: Payer: Managed Care, Other (non HMO)

## 2015-12-12 ENCOUNTER — Other Ambulatory Visit (HOSPITAL_BASED_OUTPATIENT_CLINIC_OR_DEPARTMENT_OTHER): Payer: Managed Care, Other (non HMO)

## 2015-12-12 ENCOUNTER — Ambulatory Visit (HOSPITAL_BASED_OUTPATIENT_CLINIC_OR_DEPARTMENT_OTHER): Payer: Managed Care, Other (non HMO) | Admitting: Nurse Practitioner

## 2015-12-12 ENCOUNTER — Ambulatory Visit: Payer: Managed Care, Other (non HMO)

## 2015-12-12 ENCOUNTER — Telehealth: Payer: Self-pay | Admitting: Nurse Practitioner

## 2015-12-12 VITALS — BP 122/68 | HR 62 | Temp 98.5°F | Resp 17 | Ht 65.0 in | Wt 136.0 lb

## 2015-12-12 DIAGNOSIS — M8580 Other specified disorders of bone density and structure, unspecified site: Secondary | ICD-10-CM | POA: Diagnosis not present

## 2015-12-12 DIAGNOSIS — Z5112 Encounter for antineoplastic immunotherapy: Secondary | ICD-10-CM

## 2015-12-12 DIAGNOSIS — C50411 Malignant neoplasm of upper-outer quadrant of right female breast: Secondary | ICD-10-CM

## 2015-12-12 DIAGNOSIS — Z95828 Presence of other vascular implants and grafts: Secondary | ICD-10-CM

## 2015-12-12 DIAGNOSIS — C50912 Malignant neoplasm of unspecified site of left female breast: Secondary | ICD-10-CM

## 2015-12-12 LAB — COMPREHENSIVE METABOLIC PANEL
ALBUMIN: 3.6 g/dL (ref 3.5–5.0)
ALK PHOS: 126 U/L (ref 40–150)
ALT: 26 U/L (ref 0–55)
ANION GAP: 8 meq/L (ref 3–11)
AST: 24 U/L (ref 5–34)
BILIRUBIN TOTAL: 0.37 mg/dL (ref 0.20–1.20)
BUN: 14.1 mg/dL (ref 7.0–26.0)
CALCIUM: 9 mg/dL (ref 8.4–10.4)
CO2: 24 mEq/L (ref 22–29)
Chloride: 109 mEq/L (ref 98–109)
Creatinine: 0.8 mg/dL (ref 0.6–1.1)
EGFR: 82 mL/min/{1.73_m2} — AB (ref >90)
GLUCOSE: 100 mg/dL (ref 70–140)
POTASSIUM: 4.5 meq/L (ref 3.5–5.1)
Sodium: 141 mEq/L (ref 136–145)
TOTAL PROTEIN: 6.5 g/dL (ref 6.4–8.3)

## 2015-12-12 LAB — CBC WITH DIFFERENTIAL/PLATELET
BASO%: 1.3 % (ref 0.0–2.0)
BASOS ABS: 0.1 10*3/uL (ref 0.0–0.1)
EOS ABS: 0.2 10*3/uL (ref 0.0–0.5)
EOS%: 4.3 % (ref 0.0–7.0)
HEMATOCRIT: 41.1 % (ref 34.8–46.6)
HEMOGLOBIN: 14 g/dL (ref 11.6–15.9)
LYMPH%: 33.7 % (ref 14.0–49.7)
MCH: 28.8 pg (ref 25.1–34.0)
MCHC: 34.1 g/dL (ref 31.5–36.0)
MCV: 84.4 fL (ref 79.5–101.0)
MONO#: 0.3 10*3/uL (ref 0.1–0.9)
MONO%: 6 % (ref 0.0–14.0)
NEUT%: 54.7 % (ref 38.4–76.8)
NEUTROS ABS: 3.2 10*3/uL (ref 1.5–6.5)
PLATELETS: 264 10*3/uL (ref 145–400)
RBC: 4.86 10*6/uL (ref 3.70–5.45)
RDW: 14.2 % (ref 11.2–14.5)
WBC: 5.8 10*3/uL (ref 3.9–10.3)
lymph#: 2 10*3/uL (ref 0.9–3.3)

## 2015-12-12 MED ORDER — DIPHENHYDRAMINE HCL 25 MG PO CAPS
25.0000 mg | ORAL_CAPSULE | Freq: Once | ORAL | Status: AC
  Administered 2015-12-12: 25 mg via ORAL

## 2015-12-12 MED ORDER — SODIUM CHLORIDE 0.9% FLUSH
10.0000 mL | INTRAVENOUS | Status: DC | PRN
  Administered 2015-12-12: 10 mL via INTRAVENOUS
  Filled 2015-12-12: qty 10

## 2015-12-12 MED ORDER — HEPARIN SOD (PORK) LOCK FLUSH 100 UNIT/ML IV SOLN
500.0000 [IU] | Freq: Once | INTRAVENOUS | Status: AC | PRN
  Administered 2015-12-12: 500 [IU]
  Filled 2015-12-12: qty 5

## 2015-12-12 MED ORDER — TRASTUZUMAB CHEMO INJECTION 440 MG
6.0000 mg/kg | Freq: Once | INTRAVENOUS | Status: AC
  Administered 2015-12-12: 399 mg via INTRAVENOUS
  Filled 2015-12-12: qty 19

## 2015-12-12 MED ORDER — DIPHENHYDRAMINE HCL 25 MG PO CAPS
ORAL_CAPSULE | ORAL | Status: AC
  Filled 2015-12-12: qty 1

## 2015-12-12 MED ORDER — SODIUM CHLORIDE 0.9 % IV SOLN
Freq: Once | INTRAVENOUS | Status: AC
  Administered 2015-12-12: 10:00:00 via INTRAVENOUS

## 2015-12-12 MED ORDER — SODIUM CHLORIDE 0.9 % IJ SOLN
10.0000 mL | INTRAMUSCULAR | Status: DC | PRN
  Administered 2015-12-12: 10 mL
  Filled 2015-12-12: qty 10

## 2015-12-12 MED ORDER — ACETAMINOPHEN 325 MG PO TABS
650.0000 mg | ORAL_TABLET | Freq: Once | ORAL | Status: AC
  Administered 2015-12-12: 650 mg via ORAL

## 2015-12-12 MED ORDER — ACETAMINOPHEN 325 MG PO TABS
ORAL_TABLET | ORAL | Status: AC
  Filled 2015-12-12: qty 2

## 2015-12-12 MED ORDER — ANASTROZOLE 1 MG PO TABS: 1.0000 mg | ORAL_TABLET | Freq: Every day | ORAL | Status: DC

## 2015-12-12 NOTE — Patient Instructions (Signed)

## 2015-12-12 NOTE — Telephone Encounter (Signed)
Mammogram at [facility] 7/6 at 1015 am

## 2015-12-12 NOTE — Patient Instructions (Signed)
[facility] Discharge Instructions for Patients Receiving Chemotherapy  Today you received the following chemotherapy agents: Herceptin   To help prevent nausea and vomiting after your treatment, we encourage you to take your nausea medication as directed.    If you develop nausea and vomiting that is not controlled by your nausea medication, call the clinic.   BELOW ARE SYMPTOMS THAT SHOULD BE REPORTED IMMEDIATELY:  *FEVER GREATER THAN 100.5 F  *CHILLS WITH OR WITHOUT FEVER  NAUSEA AND VOMITING THAT IS NOT CONTROLLED WITH YOUR NAUSEA MEDICATION  *UNUSUAL SHORTNESS OF BREATH  *UNUSUAL BRUISING OR BLEEDING  TENDERNESS IN MOUTH AND THROAT WITH OR WITHOUT PRESENCE OF ULCERS  *URINARY PROBLEMS  *BOWEL PROBLEMS  UNUSUAL RASH Items with * indicate a potential emergency and should be followed up as soon as possible.  Feel free to call the clinic you have any questions or concerns. The clinic phone number is [phone].  Please show the CHEMO ALERT CARD at check-in to the Emergency Department and triage nurse.   

## 2015-12-12 NOTE — Telephone Encounter (Signed)
appt made and avs printed °

## 2015-12-12 NOTE — Progress Notes (Signed)
[place]  [phone]) [phone] [phone]) [phone]   ID: Catherine Lawson DOB: [birth year]  MR#: [id number]  MVH#:[id number]  Patient Care Team: Cari Caraway, MD as PCP - General (Family Medicine) Excell Seltzer, MD as Consulting Physician (General Surgery) Chauncey Cruel, MD as Consulting Physician (Oncology) Kyung Rudd, MD as Consulting Physician (Radiation Oncology) Mauro Kaufmann, RN as Registered Nurse Rockwell Germany, RN as Registered Nurse Sylvan Cheese, NP as Nurse Practitioner (Nurse Practitioner) Vania Rea, MD as Consulting Physician (Obstetrics and Gynecology) Thompson Grayer, MD as Consulting Physician (Cardiology) PCP: Cari Caraway, MD OTHER MD:  CHIEF COMPLAINT: Estrogen receptor positive breast cancer  CURRENT TREATMENT: trastuzumab  BREAST CANCER HISTORY: From the original intake note:     "Catherine Lawson" had routine screening mammography at [facility] 01/24/2015. This showed a 1.6 cm mass in the right breast at the 11:00 position, and on 0.8 cm irregular density also at the 11:00 position farther away from the nipple. On 02/01/2015 she underwent a unilateral right diagnostic mammography at [facility]. The breast density was category B. In addition to the 2 masses previously noted there was a cluster of calcifications at the 9:00 position. Ultrasonography the same day showed a 1.9 cm oval mass, which was hypoechoic with no vascularity and hard on L a Stogner if he, a 7 mm mass at the 10:00 position with no vascularity and intermediate L a Stogner 3, and a lymph node with uniform cortex thickening in the right axilla. The 2 masses in question were separated by 2 cm.  On 02/02/2015 the patient underwent biopsy of what appears to have been the larger of the 2 breast masses (I cannot locate the biopsy report). This documented invasive ductal carcinoma, grade 2 or 3, with micropapillary features, estrogen and progesterone receptor positive, with an MIB-1 of 40%,  and HER-2 not amplified with a signals ratio of 1.36 and a number per cell of 3.60. There was evidence of lymphovascular invasion.  Because of bleeding from the first biopsy, the second breast mass could not be performed. The lymph node was aspirated, not cord, and this appeared benign but the concordance is questionable.  The patient's subsequent history is as detailed below  INTERVAL HISTORY: Catherine Lawson returns today for follow-up of her estrogen receptor positive and HER-2 positive breast cancer, alone. She is due for her next trastuzumab treatment today. The interval history is remarkable for a cardiac ablation 2 weeks ago. She found out that she has multiple AV nodes, so the extra were eliminated and she is now arrhythmia free. She has also completed radiation and still has remarkable energy. She is back to work and is working out several days weekly at full intensity. Her hair has grown back, just in time for her daughter's wedding last month.  REVIEW OF SYSTEMS:  A detailed review of systems is entirely negative today.   PAST MEDICAL HISTORY: Past Medical History  Diagnosis Date  . Breast cancer of upper-outer quadrant of right female breast ([place]) 02/06/2015  . Breast cancer ([place])   . Family history of breast cancer   . Family history of colon cancer   . Family history of ovarian cancer   . Family history of stomach cancer   . Arthritis     bil thumb joints  . SVT (supraventricular tachycardia) (HCC)     fast HR at times due to stress, no chest pain or SOB    PAST SURGICAL HISTORY: Past Surgical History  Procedure Laterality Date  . Wisdom  tooth extraction    . Radioactive seed guided mastectomy with axillary sentinel lymph node biopsy Right 05/29/2015    Procedure: RADIOACTIVE SEED RIGHT BREAST LUMPECTOMY WITH AXILLARY SENTINEL LYMPH NODE BIOPSY;  Surgeon: Excell Seltzer, MD;  Location: [place];  Service: General;  Laterality: Right;  . Re-excision of breast  lumpectomy Right 06/12/2015    Procedure: RE-EXCISION OF RIGHT  BREAST LUMPECTOMY;  Surgeon: Excell Seltzer, MD;  Location[facility];  Service: General;  Laterality: Right;  . Portacath placement Right 06/27/2015    Procedure: INSERTION PORT-A-CATH;  Surgeon: Excell Seltzer, MD;  Location: [place];  Service: General;  Laterality: Right;  . Electrophysiologic study N/A 11/28/2015    Procedure: SVT Ablation;  Surgeon: Thompson Grayer, MD;  Location: [place] [facility];  Service: Cardiovascular;  Laterality: N/A;    FAMILY HISTORY Family History  Problem Relation Age of Onset  . Breast cancer Sister 57  . Colon cancer Father 27  . Cancer Maternal Grandmother     probable ovarian cancer  . Stomach cancer Paternal Grandmother 30  . Brain cancer Other 10    Medulloblastoma  . Parkinson's disease Mother   . Lung cancer Paternal Uncle     smoker  . Stomach cancer Other     PGMs sister  . Stomach cancer Other     PMGs brother   the patient's father is still living, at age 79. The patient's mother died from Parkinson's disease complications at age 2. The patient had 5 brothers, 4 sisters. One sister was diagnosed with breast cancer at age 59 and died at age 70. The patient's father was diagnosed with colon cancer at the age of 60. A nephew was diagnosed with medulloblastoma at age 74 and died at age 42. The paternal grandmother was diagnosed with stomach cancer at age 23. The maternal grandmother was diagnosed with metastatic cancer of unknown type at age 74. (The description is suggestive of an ovarian cancer).  GYNECOLOGIC HISTORY:  No LMP recorded. Patient is postmenopausal. Menarche age 49. The patient is GX P0. She stopped having periods in 2014. She did not take hormone replacement. She took oral contraceptives for approximately 20 years with no complications.  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 Her husband Catherine Lawson works  for a Merchandiser, retail in [employer] [employer] credited Warehouse manager". Catherine Lawson has 2 daughters from an earlier marriage, Catherine Lawson lives in [place] and works in recruiting, and Cheral Cappucci lives in [place] and works in Press photographer. They have 1 grandchild. The patient attends the [employer]   HEALTH MAINTENANCE: Social History  Substance Use Topics  . Smoking status: Never Smoker   . Smokeless tobacco: Not on file  . Alcohol Use: Yes     Comment: social     Colonoscopy: 2014/lobe are  PAP: 2015  Bone density: Remote  Lipid panel:  No Known Allergies  Current Outpatient Prescriptions  Medication Sig Dispense Refill  . cetirizine (ZYRTEC) 10 MG tablet Take 10 mg by mouth daily.    Marland Kitchen lidocaine-prilocaine (EMLA) cream Apply 1 application topically as needed (Eczema).    Mable Fill (EQ EYE ALLERGY RELIEF OP) Apply 1 drop to eye as needed (EYE ALLERGIES).    Marland Kitchen anastrozole (ARIMIDEX) 1 MG tablet Take 1 tablet (1 mg total) by mouth daily. 90 tablet 3   No current facility-administered medications for this visit.  Facility-Administered Medications Ordered in Other Visits  Medication Dose Route Frequency Provider Last Rate Last Dose  . sodium chloride flush (NS) 0.9 % injection 10 mL  10 mL Intravenous PRN Chauncey Cruel, MD   10 mL at 12/12/15 0846    OBJECTIVE: Middle-aged white woman In no acute distress Filed Vitals:   12/12/15 0901  BP: 122/68  Pulse: 62  Temp: 98.5 F (36.9 C)  Resp: 17     Body mass index is 22.63 kg/(m^2).    ECOG FS:1 - Symptomatic but completely ambulatory  Skin: warm, dry  HEENT: sclerae anicteric, conjunctivae pink, oropharynx clear. No thrush or mucositis.  Lymph Nodes: No cervical or supraclavicular lymphadenopathy  Lungs: clear to auscultation bilaterally, no rales, wheezes, or rhonci  Heart: regular rate and rhythm  Abdomen: round, soft, non tender, positive bowel sounds    Musculoskeletal: No focal spinal tenderness, no peripheral edema  Neuro: non focal, well oriented, positive affect  Breasts: deferred   LAB RESULTS:  CMP     Component Value Date/Time   NA 141 12/12/2015 0830   NA 139 11/21/2015 1147   K 4.5 12/12/2015 0830   K 4.8 11/21/2015 1147   CL 102 11/21/2015 1147   CO2 24 12/12/2015 0830   CO2 25 11/21/2015 1147   GLUCOSE 100 12/12/2015 0830   GLUCOSE 82 11/21/2015 1147   BUN 14.1 12/12/2015 0830   BUN 12 11/21/2015 1147   CREATININE 0.8 12/12/2015 0830   CREATININE 0.86 11/21/2015 1147   CREATININE 0.86 09/08/2015 0226   CALCIUM 9.0 12/12/2015 0830   CALCIUM 9.4 11/21/2015 1147   PROT 6.5 12/12/2015 0830   PROT 5.0* 09/08/2015 0226   ALBUMIN 3.6 12/12/2015 0830   ALBUMIN 2.7* 09/08/2015 0226   AST 24 12/12/2015 0830   AST 20 09/08/2015 0226   ALT 26 12/12/2015 0830   ALT 24 09/08/2015 0226   ALKPHOS 126 12/12/2015 0830   ALKPHOS 112 09/08/2015 0226   BILITOT 0.37 12/12/2015 0830   BILITOT 0.6 09/08/2015 0226   GFRNONAA >60 09/08/2015 0226   GFRAA >60 09/08/2015 0226    INo results found for: SPEP, UPEP  Lab Results  Component Value Date   WBC 5.8 12/12/2015   NEUTROABS 3.2 12/12/2015   HGB 14.0 12/12/2015   HCT 41.1 12/12/2015   MCV 84.4 12/12/2015   PLT 264 12/12/2015      Chemistry      Component Value Date/Time   NA 141 12/12/2015 0830   NA 139 11/21/2015 1147   K 4.5 12/12/2015 0830   K 4.8 11/21/2015 1147   CL 102 11/21/2015 1147   CO2 24 12/12/2015 0830   CO2 25 11/21/2015 1147   BUN 14.1 12/12/2015 0830   BUN 12 11/21/2015 1147   CREATININE 0.8 12/12/2015 0830   CREATININE 0.86 11/21/2015 1147   CREATININE 0.86 09/08/2015 0226      Component Value Date/Time   CALCIUM 9.0 12/12/2015 0830   CALCIUM 9.4 11/21/2015 1147   ALKPHOS 126 12/12/2015 0830   ALKPHOS 112 09/08/2015 0226   AST 24 12/12/2015 0830   AST 20 09/08/2015 0226   ALT 26 12/12/2015 0830   ALT 24 09/08/2015 0226   BILITOT  0.37 12/12/2015 0830   BILITOT 0.6 09/08/2015 0226       No results found for: LABCA2  No components found for: GYJEH631  No results for input(s): INR in the last 168 hours.  Urinalysis No results found for: COLORURINE, APPEARANCEUR, [place], [place], [place],  HGBUR, BILIRUBINUR, KETONESUR, PROTEINUR, UROBILINOGEN, NITRITE, LEUKOCYTESUR  STUDIES: No results found.  ASSESSMENT: 60 y.o. [place] woman status post right breast biopsy 02/02/2015 for a clinically multifocal T1c NX, stage IA invasive ductal carcinoma, grade 2 or 3, estrogen and progesterone receptor positive, with HER-2 not amplified and the MIB-1 at 40%  (1) biopsy of a suspicious left breast mass 03/28/2015 showed only usual ductal hyperplasia, no malignancy identified.  (2) genetics testing 03/06/2015 through the Breast/Ovarian gene panel offered by [facility] found no deleterious mutations in ATM, BARD1, BRCA1, BRCA2, BRIP1, CDH1, CHEK2, EPCAM, FANCC, MLH1, MSH2, MSH6, NBN, PALB2, PMS2, PTEN, RAD51C, RAD51D, TP53, and XRCC2.  (a) there was a Variant of Unknown Significance in the BRCA2 gene called [id number]  (3) status post right lumpectomy and sentinel lymph node sampling 05/29/2015 for an mpT1b pN0, stage IA invasive ductal carcinoma, grade 2, with positive margins  (a) margins cleared with subsequent surgery 06/12/2015  (3) Oncotype score of 35 predicts a risk of outside the breast recurrence within 10 years of 24% if the patient's only systemic therapy is tamoxifen for 5 years. It also predicts significant benefit from chemotherapy.  (4) adjuvant chemotherapy consisting of cyclophosphamide and docetaxel 4, given 3 weeks apart, starting 06/27/2015, completed 08/30/2015  (5) FISH panel returned HER-2 positive. Started  trastuzumab every 3 weeks through 1 year, starting 08/08/2014  (a) echocardiogram on 07/11/15 showed an EF of 60-65%  (6) adjuvant radiation in process  (7) received anastrozole 02/08/2015  through December 2016, to resume at the completion of radiation  (a) DEXA scan at [facility] 03/28/2015 showed osteopenia, with a T score of -2.1  PLAN: Catherine Lawson looks and feels well today. We discussed restarting anastrozole today, and she is in agreement. I sent in a new prescription to her pharmacy today. I briefly reviewed potential side effects, including hot flashes, vaginal dryness, arthralgias and myalgias, as well as bone density loss. She is already moderately osteopenic so we talked about the various bone strengthening methods and agents. She is already performing regular weight bearing exercise, and she is going to start on a vitamin D supplement. She is choosing to go with yearly zometa infusions as her bisphosphonate therapy of choice. She understands the risk of hypocalcemia and osteonecrosis of the jaw. This will begin with the following trastuzumab infusion in 3 weeks.   Catherine Lawson is due for a repeat echocardiogram and annual mammogram in July, so I have placed those orders during our visit.   Catherine Lawson will continue trastuzumab every 3 weeks. She will return in August for follow up with Dr. Jana Hakim. She understands and agree with this plan. She knows the goal of treatment in her case is cure. She has been encouraged to call with any issues that might arise before her next visit here.   Laurie Panda, NP 12/12/2015

## 2015-12-13 ENCOUNTER — Telehealth: Payer: Self-pay | Admitting: Oncology

## 2015-12-13 NOTE — Telephone Encounter (Signed)
lvm to inform patient of appt date/times for imaging in July

## 2015-12-18 ENCOUNTER — Ambulatory Visit: Payer: Managed Care, Other (non HMO) | Admitting: Internal Medicine

## 2015-12-19 ENCOUNTER — Ambulatory Visit: Payer: Self-pay | Admitting: Radiation Oncology

## 2015-12-21 ENCOUNTER — Telehealth (HOSPITAL_COMMUNITY): Payer: Self-pay | Admitting: Vascular Surgery

## 2015-12-21 NOTE — Telephone Encounter (Signed)
Left message to make 3 month f/u w/ ECHO

## 2015-12-25 ENCOUNTER — Encounter: Payer: Self-pay | Admitting: Internal Medicine

## 2015-12-25 ENCOUNTER — Ambulatory Visit (INDEPENDENT_AMBULATORY_CARE_PROVIDER_SITE_OTHER): Payer: Managed Care, Other (non HMO) | Admitting: Internal Medicine

## 2015-12-25 VITALS — BP 108/78 | HR 66 | Ht 65.0 in | Wt 134.6 lb

## 2015-12-25 DIAGNOSIS — I471 Supraventricular tachycardia: Secondary | ICD-10-CM

## 2015-12-25 NOTE — Patient Instructions (Signed)
Medication Instructions:  Your physician recommends that you continue on your current medications as directed. Please refer to the Current Medication list given to you today.  Labwork: None ordered.  Testing/Procedures: None ordered.  Follow-Up: Your physician recommends that you schedule a follow-up appointment as needed.   Any Other Special Instructions Will Be Listed Below (If Applicable).     If you need a refill on your cardiac medications before your next appointment, please call your pharmacy.   

## 2015-12-25 NOTE — Progress Notes (Signed)
PCP: MCNEILL,WENDY, MD   Catherine Lawson is a 60 y.o. female who presents today for routine electrophysiology followup.  Since her recent SVT ablation, the patient reports doing very well. She has had no further SVT.  Denies procedure related complications.  Today, she denies symptoms of palpitations, chest pain, shortness of breath,  lower extremity edema, dizziness, presyncope, or syncope.  The patient is otherwise without complaint today.   Past Medical History  Diagnosis Date  . Breast cancer of upper-outer quadrant of right female breast ([place]) 02/06/2015  . Breast cancer ([place])   . Family history of breast cancer   . Family history of colon cancer   . Family history of ovarian cancer   . Family history of stomach cancer   . Arthritis     bil thumb joints  . SVT (supraventricular tachycardia) (HCC)     fast HR at times due to stress, no chest pain or SOB   Past Surgical History  Procedure Laterality Date  . Wisdom tooth extraction    . Radioactive seed guided mastectomy with axillary sentinel lymph node biopsy Right 05/29/2015    Procedure: RADIOACTIVE SEED RIGHT BREAST LUMPECTOMY WITH AXILLARY SENTINEL LYMPH NODE BIOPSY;  Surgeon: Excell Seltzer, MD;  Location: [facility];  Service: General;  Laterality: Right;  . Re-excision of breast lumpectomy Right 06/12/2015    Procedure: RE-EXCISION OF RIGHT  BREAST LUMPECTOMY;  Surgeon: Excell Seltzer, MD;  Location[facility];  Service: General;  Laterality: Right;  . Portacath placement Right 06/27/2015    Procedure: INSERTION PORT-A-CATH;  Surgeon: Excell Seltzer, MD;  Location: [place];  Service: General;  Laterality: Right;  . Electrophysiologic study N/A 11/28/2015    left lateral accessory pathway and slow pathway ablation performed by Dr Rayann Heman    ROS- all systems are reviewed and negatives except as per HPI above  Current Outpatient Prescriptions  Medication Sig Dispense Refill  .  anastrozole (ARIMIDEX) 1 MG tablet Take 1 tablet (1 mg total) by mouth daily. 90 tablet 3  . cetirizine (ZYRTEC) 10 MG tablet Take 10 mg by mouth daily.    Marland Kitchen ketotifen (ZADITOR) 0.025 % ophthalmic solution Place 2 drops into both eyes daily as needed (allergies).    Marland Kitchen lidocaine-prilocaine (EMLA) cream Apply 1 application topically daily as needed (Eczema).      No current facility-administered medications for this visit.    Physical Exam: Filed Vitals:   12/25/15 1450  BP: 108/78  Pulse: 66  Height: 5\' 5"  (1.651 m)  Weight: 134 lb 9.6 oz (61.054 kg)    GEN- The patient is well appearing, alert and oriented x 3 today.   Head- normocephalic, atraumatic Eyes-  Sclera clear, conjunctiva pink Ears- hearing intact Oropharynx- clear Lungs- Clear to ausculation bilaterally, normal work of breathing Heart- Regular rate and rhythm, no murmurs, rubs or gallops, PMI not laterally displaced GI- soft, NT, ND, + BS Extremities- no clubbing, cyanosis, or edema  ekg today reveals sinus rhythm 66 bpm, PR 152 msec, QRS 86 msec, Qtc 410 msec, normal ekg  Assessment and Plan:  1. SVT Resolved s/p ablation No further EP workup or management planned  I will see as needed going forward  Thompson Grayer MD, [facility] 12/25/2015 3:48 PM

## 2015-12-26 ENCOUNTER — Ambulatory Visit: Payer: Self-pay | Admitting: Radiation Oncology

## 2016-01-01 ENCOUNTER — Other Ambulatory Visit: Payer: Self-pay | Admitting: *Deleted

## 2016-01-01 DIAGNOSIS — C50411 Malignant neoplasm of upper-outer quadrant of right female breast: Secondary | ICD-10-CM

## 2016-01-02 ENCOUNTER — Ambulatory Visit: Payer: Managed Care, Other (non HMO)

## 2016-01-02 ENCOUNTER — Encounter: Payer: Self-pay | Admitting: *Deleted

## 2016-01-02 ENCOUNTER — Ambulatory Visit (HOSPITAL_BASED_OUTPATIENT_CLINIC_OR_DEPARTMENT_OTHER): Payer: Managed Care, Other (non HMO)

## 2016-01-02 ENCOUNTER — Other Ambulatory Visit (HOSPITAL_BASED_OUTPATIENT_CLINIC_OR_DEPARTMENT_OTHER): Payer: Managed Care, Other (non HMO)

## 2016-01-02 ENCOUNTER — Other Ambulatory Visit: Payer: Self-pay | Admitting: Oncology

## 2016-01-02 VITALS — BP 127/60 | HR 71 | Temp 97.7°F | Resp 18

## 2016-01-02 DIAGNOSIS — Z5112 Encounter for antineoplastic immunotherapy: Secondary | ICD-10-CM | POA: Diagnosis not present

## 2016-01-02 DIAGNOSIS — M8580 Other specified disorders of bone density and structure, unspecified site: Secondary | ICD-10-CM

## 2016-01-02 DIAGNOSIS — C50411 Malignant neoplasm of upper-outer quadrant of right female breast: Secondary | ICD-10-CM | POA: Diagnosis not present

## 2016-01-02 DIAGNOSIS — C50912 Malignant neoplasm of unspecified site of left female breast: Secondary | ICD-10-CM

## 2016-01-02 LAB — CBC WITH DIFFERENTIAL/PLATELET
BASO%: 1.1 % (ref 0.0–2.0)
BASOS ABS: 0.1 10*3/uL (ref 0.0–0.1)
EOS%: 4.4 % (ref 0.0–7.0)
Eosinophils Absolute: 0.2 10*3/uL (ref 0.0–0.5)
HEMATOCRIT: 40 % (ref 34.8–46.6)
HEMOGLOBIN: 13.4 g/dL (ref 11.6–15.9)
LYMPH#: 1.7 10*3/uL (ref 0.9–3.3)
LYMPH%: 35 % (ref 14.0–49.7)
MCH: 28.5 pg (ref 25.1–34.0)
MCHC: 33.5 g/dL (ref 31.5–36.0)
MCV: 85.1 fL (ref 79.5–101.0)
MONO#: 0.3 10*3/uL (ref 0.1–0.9)
MONO%: 6 % (ref 0.0–14.0)
NEUT%: 53.5 % (ref 38.4–76.8)
NEUTROS ABS: 2.7 10*3/uL (ref 1.5–6.5)
Platelets: 194 10*3/uL (ref 145–400)
RBC: 4.7 10*6/uL (ref 3.70–5.45)
RDW: 14.7 % — AB (ref 11.2–14.5)
WBC: 5 10*3/uL (ref 3.9–10.3)

## 2016-01-02 LAB — COMPREHENSIVE METABOLIC PANEL
ALBUMIN: 3.3 g/dL — AB (ref 3.5–5.0)
ALK PHOS: 105 U/L (ref 40–150)
ALT: 27 U/L (ref 0–55)
AST: 27 U/L (ref 5–34)
Anion Gap: 9 mEq/L (ref 3–11)
BILIRUBIN TOTAL: 0.42 mg/dL (ref 0.20–1.20)
BUN: 14.9 mg/dL (ref 7.0–26.0)
CALCIUM: 8.6 mg/dL (ref 8.4–10.4)
CO2: 23 mEq/L (ref 22–29)
CREATININE: 0.8 mg/dL (ref 0.6–1.1)
Chloride: 111 mEq/L — ABNORMAL HIGH (ref 98–109)
EGFR: 80 mL/min/{1.73_m2} — ABNORMAL LOW (ref >90)
Glucose: 89 mg/dl (ref 70–140)
POTASSIUM: 4.1 meq/L (ref 3.5–5.1)
Sodium: 143 mEq/L (ref 136–145)
TOTAL PROTEIN: 6.1 g/dL — AB (ref 6.4–8.3)

## 2016-01-02 MED ORDER — SODIUM CHLORIDE 0.9 % IV SOLN
Freq: Once | INTRAVENOUS | Status: AC
  Administered 2016-01-02: 09:00:00 via INTRAVENOUS

## 2016-01-02 MED ORDER — TRASTUZUMAB CHEMO INJECTION 440 MG
6.0000 mg/kg | Freq: Once | INTRAVENOUS | Status: AC
  Administered 2016-01-02: 399 mg via INTRAVENOUS
  Filled 2016-01-02: qty 19

## 2016-01-02 MED ORDER — SODIUM CHLORIDE 0.9 % IJ SOLN
10.0000 mL | Freq: Once | INTRAMUSCULAR | Status: AC
  Administered 2016-01-02: 10 mL
  Filled 2016-01-02: qty 10

## 2016-01-02 MED ORDER — ACETAMINOPHEN 325 MG PO TABS
650.0000 mg | ORAL_TABLET | Freq: Once | ORAL | Status: AC
  Administered 2016-01-02: 650 mg via ORAL

## 2016-01-02 MED ORDER — DIPHENHYDRAMINE HCL 25 MG PO CAPS
ORAL_CAPSULE | ORAL | Status: AC
  Filled 2016-01-02: qty 1

## 2016-01-02 MED ORDER — ZOLEDRONIC ACID 4 MG/100ML IV SOLN
4.0000 mg | Freq: Once | INTRAVENOUS | Status: AC
  Administered 2016-01-02: 4 mg via INTRAVENOUS
  Filled 2016-01-02 (×2): qty 100

## 2016-01-02 MED ORDER — HEPARIN SOD (PORK) LOCK FLUSH 100 UNIT/ML IV SOLN
500.0000 [IU] | Freq: Once | INTRAVENOUS | Status: AC | PRN
  Administered 2016-01-02: 500 [IU]
  Filled 2016-01-02: qty 5

## 2016-01-02 MED ORDER — SODIUM CHLORIDE 0.9 % IJ SOLN
10.0000 mL | INTRAMUSCULAR | Status: DC | PRN
  Administered 2016-01-02: 10 mL
  Filled 2016-01-02: qty 10

## 2016-01-02 MED ORDER — DIPHENHYDRAMINE HCL 25 MG PO CAPS
25.0000 mg | ORAL_CAPSULE | Freq: Once | ORAL | Status: AC
  Administered 2016-01-02: 25 mg via ORAL

## 2016-01-02 MED ORDER — ACETAMINOPHEN 325 MG PO TABS
ORAL_TABLET | ORAL | Status: AC
  Filled 2016-01-02: qty 2

## 2016-01-02 NOTE — Patient Instructions (Signed)
Trastuzumab injection for infusion What is this medicine? TRASTUZUMAB (tras TOO zoo mab) is a monoclonal antibody. It is used to treat breast cancer and stomach cancer. This medicine may be used for other purposes; ask your health care provider or pharmacist if you have questions. What should I tell my health care provider before I take this medicine? They need to know if you have any of these conditions: -heart disease -heart failure -infection (especially a virus infection such as chickenpox, cold sores, or herpes) -lung or breathing disease, like asthma -recent or ongoing radiation therapy -an unusual or allergic reaction to trastuzumab, benzyl alcohol, or other medications, foods, dyes, or preservatives -pregnant or trying to get pregnant -breast-feeding How should I use this medicine? This drug is given as an infusion into a vein. It is administered in a hospital or clinic by a specially trained health care professional. Talk to your pediatrician regarding the use of this medicine in children. This medicine is not approved for use in children. Overdosage: If you think you have taken too much of this medicine contact a poison control center or emergency room at once. NOTE: This medicine is only for you. Do not share this medicine with others. What if I miss a dose? It is important not to miss a dose. Call your doctor or health care professional if you are unable to keep an appointment. What may interact with this medicine? -doxorubicin -warfarin This list may not describe all possible interactions. Give your health care provider a list of all the medicines, herbs, non-prescription drugs, or dietary supplements you use. Also tell them if you smoke, drink alcohol, or use illegal drugs. Some items may interact with your medicine. What should I watch for while using this medicine? Visit your doctor for checks on your progress. Report any side effects. Continue your course of treatment even  though you feel ill unless your doctor tells you to stop. Call your doctor or health care professional for advice if you get a fever, chills or sore throat, or other symptoms of a cold or flu. Do not treat yourself. Try to avoid being around people who are sick. You may experience fever, chills and shaking during your first infusion. These effects are usually mild and can be treated with other medicines. Report any side effects during the infusion to your health care professional. Fever and chills usually do not happen with later infusions. Do not become pregnant while taking this medicine or for 7 months after stopping it. Women should inform their doctor if they wish to become pregnant or think they might be pregnant. Women of child-bearing potential will need to have a negative pregnancy test before starting this medicine. There is a potential for serious side effects to an unborn child. Talk to your health care professional or pharmacist for more information. Do not breast-feed an infant while taking this medicine or for 7 months after stopping it. Women must use effective birth control with this medicine. What side effects may I notice from receiving this medicine? Side effects that you should report to your doctor or other health care professional as soon as possible: -breathing difficulties -chest pain or palpitations -cough -dizziness or fainting -fever or chills, sore throat -skin rash, itching or hives -swelling of the legs or ankles -unusually weak or tired Side effects that usually do not require medical attention (report to your doctor or other health care professional if they continue or are bothersome): -loss of appetite -headache -muscle aches -nausea This   list may not describe all possible side effects. Call your doctor for medical advice about side effects. You may report side effects to FDA at 1-800-FDA-1088. Where should I keep my medicine? This drug is given in a hospital  or clinic and will not be stored at home. NOTE: This sheet is a summary. It may not cover all possible information. If you have questions about this medicine, talk to your doctor, pharmacist, or health care provider.    2016, Elsevier/Gold Standard. (2014-10-07 11:49:32)  Zoledronic Acid injection (Hypercalcemia, Oncology) What is this medicine? ZOLEDRONIC ACID (ZOE le dron ik AS id) lowers the amount of calcium loss from bone. It is used to treat too much calcium in your blood from cancer. It is also used to prevent complications of cancer that has spread to the bone. This medicine may be used for other purposes; ask your health care provider or pharmacist if you have questions. What should I tell my health care provider before I take this medicine? They need to know if you have any of these conditions: -aspirin-sensitive asthma -cancer, especially if you are receiving medicines used to treat cancer -dental disease or wear dentures -infection -kidney disease -receiving corticosteroids like dexamethasone or prednisone -an unusual or allergic reaction to zoledronic acid, other medicines, foods, dyes, or preservatives -pregnant or trying to get pregnant -breast-feeding How should I use this medicine? This medicine is for infusion into a vein. It is given by a health care professional in a hospital or clinic setting. Talk to your pediatrician regarding the use of this medicine in children. Special care may be needed. Overdosage: If you think you have taken too much of this medicine contact a poison control center or emergency room at once. NOTE: This medicine is only for you. Do not share this medicine with others. What if I miss a dose? It is important not to miss your dose. Call your doctor or health care professional if you are unable to keep an appointment. What may interact with this medicine? -certain antibiotics given by injection -NSAIDs, medicines for pain and inflammation, like  ibuprofen or naproxen -some diuretics like bumetanide, furosemide -teriparatide -thalidomide This list may not describe all possible interactions. Give your health care provider a list of all the medicines, herbs, non-prescription drugs, or dietary supplements you use. Also tell them if you smoke, drink alcohol, or use illegal drugs. Some items may interact with your medicine. What should I watch for while using this medicine? Visit your doctor or health care professional for regular checkups. It may be some time before you see the benefit from this medicine. Do not stop taking your medicine unless your doctor tells you to. Your doctor may order blood tests or other tests to see how you are doing. Women should inform their doctor if they wish to become pregnant or think they might be pregnant. There is a potential for serious side effects to an unborn child. Talk to your health care professional or pharmacist for more information. You should make sure that you get enough calcium and vitamin D while you are taking this medicine. Discuss the foods you eat and the vitamins you take with your health care professional. Some people who take this medicine have severe bone, joint, and/or muscle pain. This medicine may also increase your risk for jaw problems or a broken thigh bone. Tell your doctor right away if you have severe pain in your jaw, bones, joints, or muscles. Tell your doctor if you have any  pain that does not go away or that gets worse. Tell your dentist and dental surgeon that you are taking this medicine. You should not have major dental surgery while on this medicine. See your dentist to have a dental exam and fix any dental problems before starting this medicine. Take good care of your teeth while on this medicine. Make sure you see your dentist for regular follow-up appointments. What side effects may I notice from receiving this medicine? Side effects that you should report to your doctor or  health care professional as soon as possible: -allergic reactions like skin rash, itching or hives, swelling of the face, lips, or tongue -anxiety, confusion, or depression -breathing problems -changes in vision -eye pain -feeling faint or lightheaded, falls -jaw pain, especially after dental work -mouth sores -muscle cramps, stiffness, or weakness -redness, blistering, peeling or loosening of the skin, including inside the mouth -trouble passing urine or change in the amount of urine Side effects that usually do not require medical attention (report to your doctor or health care professional if they continue or are bothersome): -bone, joint, or muscle pain -constipation -diarrhea -fever -hair loss -irritation at site where injected -loss of appetite -nausea, vomiting -stomach upset -trouble sleeping -trouble swallowing -weak or tired This list may not describe all possible side effects. Call your doctor for medical advice about side effects. You may report side effects to FDA at 1-800-FDA-1088. Where should I keep my medicine? This drug is given in a hospital or clinic and will not be stored at home. NOTE: This sheet is a summary. It may not cover all possible information. If you have questions about this medicine, talk to your doctor, pharmacist, or health care provider.    2016, Elsevier/Gold Standard. (2013-11-27 14:19:39)  [facility] Discharge Instructions for Patients Receiving Chemotherapy  Today you received the following chemotherapy agents Herceptin/Zometa.  To help prevent nausea and vomiting after your treatment, we encourage you to take your nausea medication as directed.   If you develop nausea and vomiting that is not controlled by your nausea medication, call the clinic.   BELOW ARE SYMPTOMS THAT SHOULD BE REPORTED IMMEDIATELY:  *FEVER GREATER THAN 100.5 F  *CHILLS WITH OR WITHOUT FEVER  NAUSEA AND VOMITING THAT IS NOT CONTROLLED WITH  YOUR NAUSEA MEDICATION  *UNUSUAL SHORTNESS OF BREATH  *UNUSUAL BRUISING OR BLEEDING  TENDERNESS IN MOUTH AND THROAT WITH OR WITHOUT PRESENCE OF ULCERS  *URINARY PROBLEMS  *BOWEL PROBLEMS  UNUSUAL RASH Items with * indicate a potential emergency and should be followed up as soon as possible.  Feel free to call the clinic you have any questions or concerns. The clinic phone number is [phone].  Please show the [employer] at check-in to the Emergency Department and triage nurse.

## 2016-01-03 ENCOUNTER — Telehealth: Payer: Self-pay | Admitting: *Deleted

## 2016-01-03 NOTE — Telephone Encounter (Signed)
CALLED PATIENT TO ASK ABOUT COMING EARLIER ON 01-09-16, RESCHEDULED FOR 10 AM ON 01-09-16, LVM FOR A RETURN CALL

## 2016-01-09 ENCOUNTER — Encounter: Payer: Self-pay | Admitting: Radiation Oncology

## 2016-01-09 ENCOUNTER — Ambulatory Visit
Admission: RE | Admit: 2016-01-09 | Discharge: 2016-01-09 | Disposition: A | Discharge status: Home / Self Care | Payer: Managed Care, Other (non HMO) | Source: Ambulatory Visit | Attending: Radiation Oncology | Admitting: Radiation Oncology

## 2016-01-09 VITALS — BP 118/99 | HR 79 | Temp 98.0°F | Ht 65.0 in | Wt 139.4 lb

## 2016-01-09 DIAGNOSIS — C50411 Malignant neoplasm of upper-outer quadrant of right female breast: Secondary | ICD-10-CM

## 2016-01-09 DIAGNOSIS — I471 Supraventricular tachycardia: Secondary | ICD-10-CM | POA: Diagnosis not present

## 2016-01-09 DIAGNOSIS — Z170 Estrogen receptor positive status [ER+]: Secondary | ICD-10-CM | POA: Insufficient documentation

## 2016-01-09 DIAGNOSIS — D0511 Intraductal carcinoma in situ of right breast: Secondary | ICD-10-CM | POA: Diagnosis present

## 2016-01-09 NOTE — Progress Notes (Signed)
  Radiation Oncology         [phone] ________________________________  Name: Catherine Lawson MRN: [id number]  Date: 01/09/2016  DOB: January 23, 1956  Follow-Up Visit Note  CC: Cari Caraway, MD  Excell Seltzer, MD  Diagnosis:   Stage IA, T1c, ER/PR positive, HER-2 negative, ductal carcinoma of the right breast  Interval Since Last Radiation: 7 weeks  09/26/2015-11/10/2015: The patient initially received a dose of 50.4 Gy in 28 fractions to the breast using whole-breast tangent fields. This was delivered using a 3-D conformal technique. The patient then received a boost to the seroma. This delivered an additional 10 Gy in 5 fractions using a electron field. The total dose was 60.4 Gy  Narrative:  The patient returns today for routine follow-up. She did extremely well in tolerating her treatment without skin breakdown or complication.                            On review of systems, the patient states she is doing well since undergoing AV node ablation. She denies any skin changes or concerns since completing her radiotherapy. She denies chest pain, shortness of breath, fevers or chills. She denies any symptoms or side effects since taking her anastrozole.  ALLERGIES:  has No Known Allergies.  Meds: Current Outpatient Prescriptions  Medication Sig Dispense Refill  . anastrozole (ARIMIDEX) 1 MG tablet Take 1 tablet (1 mg total) by mouth daily. 90 tablet 3  . cetirizine (ZYRTEC) 10 MG tablet Take 10 mg by mouth daily.    Marland Kitchen ketotifen (ZADITOR) 0.025 % ophthalmic solution Place 2 drops into both eyes daily as needed (allergies).    Marland Kitchen lidocaine-prilocaine (EMLA) cream Apply 1 application topically daily as needed (Eczema).      No current facility-administered medications for this encounter.    Physical Findings:  height is _0  (1.651 m) and weight is 139 lb 6.4 oz (63.231 kg). Her temperature is 98 F (36.7 C). Her blood pressure is 118/99 and her pulse is 79.  In general this is a  well appearing Caucasian female in no acute distress. She's alert and oriented x4 and appropriate throughout the examination. Cardiopulmonary assessment is negative for acute distress and she exhibits normal effort. The right breast is intact with a well-healed lumpectomy scar. No evidence of desquamation or skin breakdown is appreciated.   Lab Findings: Lab Results  Component Value Date   WBC 5.0 01/02/2016   HGB 13.4 01/02/2016   HCT 40.0 01/02/2016   MCV 85.1 01/02/2016   PLT 194 01/02/2016     Radiographic Findings: No results found.  Impression/Plan: 1. Stage IA, T1c, ER/PR positive, HER-2 negative, ductal carcinoma of the right breast. The patient has done well and has completed all of her treatment including chemotherapy, radiation, and is now on coronal image ACE inhibitor. She will be followed every 3 months by Dr. Jana Hakim, and has an upcoming mammogram in July. We discussed that we will be happy to see her in the future or answer any questions or if she has any regarding her previous therapy.  2. SVT. The patient has done well since her ablation, and will continue to follow up with her cardiologist.      Carola Rhine, PAC

## 2016-01-09 NOTE — Progress Notes (Signed)
Catherine Lawson here for reassessment S/P XRT to her right breast 09/26/2015 through 11/10/2015-60.4 Gy .   She denies any pain in the tx. field and states her color is back to normal. Also reports decreased fatigue.  Anastrozole started < i month ago as reported by Catherine Lawson

## 2016-01-22 ENCOUNTER — Other Ambulatory Visit (HOSPITAL_COMMUNITY): Payer: Managed Care, Other (non HMO)

## 2016-01-23 ENCOUNTER — Other Ambulatory Visit: Payer: Self-pay | Admitting: *Deleted

## 2016-01-23 ENCOUNTER — Encounter: Payer: Self-pay | Admitting: *Deleted

## 2016-01-23 ENCOUNTER — Ambulatory Visit: Payer: Managed Care, Other (non HMO)

## 2016-01-23 ENCOUNTER — Other Ambulatory Visit (HOSPITAL_BASED_OUTPATIENT_CLINIC_OR_DEPARTMENT_OTHER): Payer: Managed Care, Other (non HMO)

## 2016-01-23 ENCOUNTER — Ambulatory Visit (HOSPITAL_BASED_OUTPATIENT_CLINIC_OR_DEPARTMENT_OTHER): Payer: Managed Care, Other (non HMO)

## 2016-01-23 VITALS — BP 109/55 | HR 64 | Temp 97.9°F | Resp 18

## 2016-01-23 DIAGNOSIS — C50411 Malignant neoplasm of upper-outer quadrant of right female breast: Secondary | ICD-10-CM

## 2016-01-23 DIAGNOSIS — Z5112 Encounter for antineoplastic immunotherapy: Secondary | ICD-10-CM | POA: Diagnosis not present

## 2016-01-23 DIAGNOSIS — Z95828 Presence of other vascular implants and grafts: Secondary | ICD-10-CM | POA: Insufficient documentation

## 2016-01-23 DIAGNOSIS — M8580 Other specified disorders of bone density and structure, unspecified site: Secondary | ICD-10-CM

## 2016-01-23 DIAGNOSIS — C50912 Malignant neoplasm of unspecified site of left female breast: Secondary | ICD-10-CM

## 2016-01-23 LAB — CBC WITH DIFFERENTIAL/PLATELET
BASO%: 0.9 % (ref 0.0–2.0)
BASOS ABS: 0.1 10*3/uL (ref 0.0–0.1)
EOS ABS: 0.2 10*3/uL (ref 0.0–0.5)
EOS%: 2.7 % (ref 0.0–7.0)
HCT: 42.6 % (ref 34.8–46.6)
HEMOGLOBIN: 14.3 g/dL (ref 11.6–15.9)
LYMPH%: 37.8 % (ref 14.0–49.7)
MCH: 28.9 pg (ref 25.1–34.0)
MCHC: 33.6 g/dL (ref 31.5–36.0)
MCV: 85.9 fL (ref 79.5–101.0)
MONO#: 0.3 10*3/uL (ref 0.1–0.9)
MONO%: 5.6 % (ref 0.0–14.0)
NEUT%: 53 % (ref 38.4–76.8)
NEUTROS ABS: 3 10*3/uL (ref 1.5–6.5)
PLATELETS: 237 10*3/uL (ref 145–400)
RBC: 4.96 10*6/uL (ref 3.70–5.45)
RDW: 14.5 % (ref 11.2–14.5)
WBC: 5.6 10*3/uL (ref 3.9–10.3)
lymph#: 2.1 10*3/uL (ref 0.9–3.3)

## 2016-01-23 LAB — COMPREHENSIVE METABOLIC PANEL
ALT: 33 U/L (ref 0–55)
AST: 24 U/L (ref 5–34)
Albumin: 3.6 g/dL (ref 3.5–5.0)
Alkaline Phosphatase: 129 U/L (ref 40–150)
Anion Gap: 10 mEq/L (ref 3–11)
BUN: 13.4 mg/dL (ref 7.0–26.0)
CO2: 24 meq/L (ref 22–29)
Calcium: 8.8 mg/dL (ref 8.4–10.4)
Chloride: 109 mEq/L (ref 98–109)
Creatinine: 0.8 mg/dL (ref 0.6–1.1)
EGFR: 80 mL/min/{1.73_m2} — AB (ref >90)
GLUCOSE: 104 mg/dL (ref 70–140)
POTASSIUM: 4.2 meq/L (ref 3.5–5.1)
SODIUM: 143 meq/L (ref 136–145)
TOTAL PROTEIN: 6.5 g/dL (ref 6.4–8.3)
Total Bilirubin: 0.59 mg/dL (ref 0.20–1.20)

## 2016-01-23 MED ORDER — ACETAMINOPHEN 325 MG PO TABS
650.0000 mg | ORAL_TABLET | Freq: Once | ORAL | Status: AC
Start: 2016-01-23 — End: 2016-01-23
  Administered 2016-01-23: 650 mg via ORAL

## 2016-01-23 MED ORDER — HEPARIN SOD (PORK) LOCK FLUSH 100 UNIT/ML IV SOLN
500.0000 [IU] | Freq: Once | INTRAVENOUS | Status: AC | PRN
  Administered 2016-01-23: 500 [IU]
  Filled 2016-01-23: qty 5

## 2016-01-23 MED ORDER — SODIUM CHLORIDE 0.9% FLUSH
10.0000 mL | INTRAVENOUS | Status: DC | PRN
  Administered 2016-01-23: 10 mL via INTRAVENOUS
  Filled 2016-01-23: qty 10

## 2016-01-23 MED ORDER — TRASTUZUMAB CHEMO 150 MG IV SOLR
6.0000 mg/kg | Freq: Once | INTRAVENOUS | Status: AC
  Administered 2016-01-23: 399 mg via INTRAVENOUS
  Filled 2016-01-23: qty 19

## 2016-01-23 MED ORDER — DIPHENHYDRAMINE HCL 25 MG PO CAPS
25.0000 mg | ORAL_CAPSULE | Freq: Once | ORAL | Status: AC
  Administered 2016-01-23: 25 mg via ORAL

## 2016-01-23 MED ORDER — ACETAMINOPHEN 325 MG PO TABS
ORAL_TABLET | ORAL | Status: AC
  Filled 2016-01-23: qty 2

## 2016-01-23 MED ORDER — SODIUM CHLORIDE 0.9 % IV SOLN
Freq: Once | INTRAVENOUS | Status: AC
  Administered 2016-01-23: 10:00:00 via INTRAVENOUS

## 2016-01-23 MED ORDER — SODIUM CHLORIDE 0.9 % IJ SOLN
10.0000 mL | INTRAMUSCULAR | Status: DC | PRN
  Administered 2016-01-23: 10 mL
  Filled 2016-01-23: qty 10

## 2016-01-23 MED ORDER — HEPARIN SOD (PORK) LOCK FLUSH 100 UNIT/ML IV SOLN
500.0000 [IU] | Freq: Once | INTRAVENOUS | Status: DC
  Filled 2016-01-23: qty 5

## 2016-01-23 MED ORDER — DIPHENHYDRAMINE HCL 25 MG PO CAPS
ORAL_CAPSULE | ORAL | Status: AC
  Filled 2016-01-23: qty 1

## 2016-01-23 NOTE — Patient Instructions (Signed)

## 2016-01-23 NOTE — Patient Instructions (Signed)
[facility] Discharge Instructions for Patients Receiving Chemotherapy  Today you received the following chemotherapy agents:  Herceptin  To help prevent nausea and vomiting after your treatment, we encourage you to take your nausea medication as prescribed.   If you develop nausea and vomiting that is not controlled by your nausea medication, call the clinic.   BELOW ARE SYMPTOMS THAT SHOULD BE REPORTED IMMEDIATELY:  *FEVER GREATER THAN 100.5 F  *CHILLS WITH OR WITHOUT FEVER  NAUSEA AND VOMITING THAT IS NOT CONTROLLED WITH YOUR NAUSEA MEDICATION  *UNUSUAL SHORTNESS OF BREATH  *UNUSUAL BRUISING OR BLEEDING  TENDERNESS IN MOUTH AND THROAT WITH OR WITHOUT PRESENCE OF ULCERS  *URINARY PROBLEMS  *BOWEL PROBLEMS  UNUSUAL RASH Items with * indicate a potential emergency and should be followed up as soon as possible.  Feel free to call the clinic you have any questions or concerns. The clinic phone number is [phone].  Please show the CHEMO ALERT CARD at check-in to the Emergency Department and triage nurse.   

## 2016-02-06 ENCOUNTER — Encounter: Payer: Self-pay | Admitting: Oncology

## 2016-02-12 ENCOUNTER — Encounter (HOSPITAL_COMMUNITY): Payer: Managed Care, Other (non HMO) | Admitting: Internal Medicine

## 2016-02-12 ENCOUNTER — Ambulatory Visit (HOSPITAL_COMMUNITY)
Admission: RE | Admit: 2016-02-12 | Discharge: 2016-02-12 | Disposition: A | Discharge status: Home / Self Care | Payer: Managed Care, Other (non HMO) | Source: Ambulatory Visit | Attending: Family Medicine | Admitting: Family Medicine

## 2016-02-12 DIAGNOSIS — C50411 Malignant neoplasm of upper-outer quadrant of right female breast: Secondary | ICD-10-CM

## 2016-02-12 DIAGNOSIS — I340 Nonrheumatic mitral (valve) insufficiency: Secondary | ICD-10-CM | POA: Diagnosis not present

## 2016-02-12 DIAGNOSIS — Z09 Encounter for follow-up examination after completed treatment for conditions other than malignant neoplasm: Secondary | ICD-10-CM | POA: Diagnosis present

## 2016-02-12 NOTE — Progress Notes (Signed)
  Echocardiogram 2D Echocardiogram has been performed.  Diamond Nickel 02/12/2016, 1:47 PM

## 2016-02-13 ENCOUNTER — Other Ambulatory Visit (HOSPITAL_BASED_OUTPATIENT_CLINIC_OR_DEPARTMENT_OTHER): Payer: Managed Care, Other (non HMO)

## 2016-02-13 ENCOUNTER — Ambulatory Visit (HOSPITAL_BASED_OUTPATIENT_CLINIC_OR_DEPARTMENT_OTHER): Payer: Managed Care, Other (non HMO)

## 2016-02-13 ENCOUNTER — Ambulatory Visit: Payer: Managed Care, Other (non HMO)

## 2016-02-13 VITALS — BP 118/91 | HR 68 | Temp 98.4°F | Resp 16

## 2016-02-13 DIAGNOSIS — M8580 Other specified disorders of bone density and structure, unspecified site: Secondary | ICD-10-CM

## 2016-02-13 DIAGNOSIS — Z95828 Presence of other vascular implants and grafts: Secondary | ICD-10-CM

## 2016-02-13 DIAGNOSIS — C50411 Malignant neoplasm of upper-outer quadrant of right female breast: Secondary | ICD-10-CM

## 2016-02-13 DIAGNOSIS — Z5112 Encounter for antineoplastic immunotherapy: Secondary | ICD-10-CM

## 2016-02-13 LAB — COMPREHENSIVE METABOLIC PANEL
ALK PHOS: 107 U/L (ref 40–150)
ALT: 34 U/L (ref 0–55)
AST: 30 U/L (ref 5–34)
Albumin: 3.7 g/dL (ref 3.5–5.0)
Anion Gap: 10 mEq/L (ref 3–11)
BUN: 21 mg/dL (ref 7.0–26.0)
CHLORIDE: 107 meq/L (ref 98–109)
CO2: 24 meq/L (ref 22–29)
Calcium: 8.9 mg/dL (ref 8.4–10.4)
Creatinine: 0.9 mg/dL (ref 0.6–1.1)
EGFR: 68 mL/min/{1.73_m2} — AB (ref >90)
GLUCOSE: 151 mg/dL — AB (ref 70–140)
POTASSIUM: 4.3 meq/L (ref 3.5–5.1)
SODIUM: 141 meq/L (ref 136–145)
Total Bilirubin: 0.5 mg/dL (ref 0.20–1.20)
Total Protein: 6.5 g/dL (ref 6.4–8.3)

## 2016-02-13 LAB — CBC WITH DIFFERENTIAL/PLATELET
BASO%: 0.8 % (ref 0.0–2.0)
BASOS ABS: 0 10*3/uL (ref 0.0–0.1)
EOS ABS: 0.2 10*3/uL (ref 0.0–0.5)
EOS%: 3.5 % (ref 0.0–7.0)
HCT: 40.2 % (ref 34.8–46.6)
HGB: 13.5 g/dL (ref 11.6–15.9)
LYMPH%: 30 % (ref 14.0–49.7)
MCH: 29.3 pg (ref 25.1–34.0)
MCHC: 33.7 g/dL (ref 31.5–36.0)
MCV: 87.1 fL (ref 79.5–101.0)
MONO#: 0.3 10*3/uL (ref 0.1–0.9)
MONO%: 4.9 % (ref 0.0–14.0)
NEUT#: 3.2 10*3/uL (ref 1.5–6.5)
NEUT%: 60.8 % (ref 38.4–76.8)
Platelets: 229 10*3/uL (ref 145–400)
RBC: 4.61 10*6/uL (ref 3.70–5.45)
RDW: 14.3 % (ref 11.2–14.5)
WBC: 5.3 10*3/uL (ref 3.9–10.3)
lymph#: 1.6 10*3/uL (ref 0.9–3.3)

## 2016-02-13 MED ORDER — SODIUM CHLORIDE 0.9% FLUSH
10.0000 mL | INTRAVENOUS | Status: DC | PRN
  Administered 2016-02-13: 10 mL
  Filled 2016-02-13: qty 10

## 2016-02-13 MED ORDER — ACETAMINOPHEN 325 MG PO TABS
650.0000 mg | ORAL_TABLET | Freq: Once | ORAL | Status: AC
Start: 2016-02-13 — End: 2016-02-13
  Administered 2016-02-13: 650 mg via ORAL

## 2016-02-13 MED ORDER — SODIUM CHLORIDE 0.9 % IV SOLN
Freq: Once | INTRAVENOUS | Status: AC
  Administered 2016-02-13: 10:00:00 via INTRAVENOUS

## 2016-02-13 MED ORDER — HEPARIN SOD (PORK) LOCK FLUSH 100 UNIT/ML IV SOLN
500.0000 [IU] | Freq: Once | INTRAVENOUS | Status: AC | PRN
  Administered 2016-02-13: 500 [IU]
  Filled 2016-02-13: qty 5

## 2016-02-13 MED ORDER — DIPHENHYDRAMINE HCL 25 MG PO CAPS
25.0000 mg | ORAL_CAPSULE | Freq: Once | ORAL | Status: AC
Start: 2016-02-13 — End: 2016-02-13
  Administered 2016-02-13: 25 mg via ORAL

## 2016-02-13 MED ORDER — DIPHENHYDRAMINE HCL 25 MG PO CAPS
ORAL_CAPSULE | ORAL | Status: AC
  Filled 2016-02-13: qty 1

## 2016-02-13 MED ORDER — SODIUM CHLORIDE 0.9 % IJ SOLN
10.0000 mL | INTRAMUSCULAR | Status: DC | PRN
  Administered 2016-02-13: 10 mL
  Filled 2016-02-13: qty 10

## 2016-02-13 MED ORDER — ACETAMINOPHEN 325 MG PO TABS
ORAL_TABLET | ORAL | Status: AC
  Filled 2016-02-13: qty 2

## 2016-02-13 MED ORDER — TRASTUZUMAB CHEMO INJECTION 440 MG
6.0000 mg/kg | Freq: Once | INTRAVENOUS | Status: AC
  Administered 2016-02-13: 399 mg via INTRAVENOUS
  Filled 2016-02-13: qty 19

## 2016-02-13 NOTE — Patient Instructions (Signed)

## 2016-02-13 NOTE — Patient Instructions (Signed)
[facility] Discharge Instructions for Patients Receiving Chemotherapy  Today you received the following chemotherapy agents: Herceptin   To help prevent nausea and vomiting after your treatment, we encourage you to take your nausea medication as directed.    If you develop nausea and vomiting that is not controlled by your nausea medication, call the clinic.   BELOW ARE SYMPTOMS THAT SHOULD BE REPORTED IMMEDIATELY:  *FEVER GREATER THAN 100.5 F  *CHILLS WITH OR WITHOUT FEVER  NAUSEA AND VOMITING THAT IS NOT CONTROLLED WITH YOUR NAUSEA MEDICATION  *UNUSUAL SHORTNESS OF BREATH  *UNUSUAL BRUISING OR BLEEDING  TENDERNESS IN MOUTH AND THROAT WITH OR WITHOUT PRESENCE OF ULCERS  *URINARY PROBLEMS  *BOWEL PROBLEMS  UNUSUAL RASH Items with * indicate a potential emergency and should be followed up as soon as possible.  Feel free to call the clinic you have any questions or concerns. The clinic phone number is [phone].  Please show the CHEMO ALERT CARD at check-in to the Emergency Department and triage nurse.   

## 2016-02-20 ENCOUNTER — Encounter (HOSPITAL_COMMUNITY): Payer: Self-pay | Admitting: Internal Medicine

## 2016-02-20 ENCOUNTER — Ambulatory Visit (HOSPITAL_COMMUNITY)
Admission: RE | Admit: 2016-02-20 | Discharge: 2016-02-20 | Disposition: A | Discharge status: Home / Self Care | Payer: Managed Care, Other (non HMO) | Source: Ambulatory Visit | Attending: Internal Medicine | Admitting: Internal Medicine

## 2016-02-20 VITALS — BP 116/64 | HR 72 | Wt 138.5 lb

## 2016-02-20 DIAGNOSIS — Z170 Estrogen receptor positive status [ER+]: Secondary | ICD-10-CM | POA: Insufficient documentation

## 2016-02-20 DIAGNOSIS — Z923 Personal history of irradiation: Secondary | ICD-10-CM | POA: Insufficient documentation

## 2016-02-20 DIAGNOSIS — C50411 Malignant neoplasm of upper-outer quadrant of right female breast: Secondary | ICD-10-CM

## 2016-02-20 DIAGNOSIS — C50911 Malignant neoplasm of unspecified site of right female breast: Secondary | ICD-10-CM | POA: Diagnosis present

## 2016-02-20 DIAGNOSIS — Z79811 Long term (current) use of aromatase inhibitors: Secondary | ICD-10-CM | POA: Diagnosis not present

## 2016-02-20 DIAGNOSIS — Z800 Family history of malignant neoplasm of digestive organs: Secondary | ICD-10-CM | POA: Insufficient documentation

## 2016-02-20 DIAGNOSIS — I471 Supraventricular tachycardia: Secondary | ICD-10-CM | POA: Diagnosis not present

## 2016-02-20 DIAGNOSIS — Z803 Family history of malignant neoplasm of breast: Secondary | ICD-10-CM | POA: Diagnosis not present

## 2016-02-20 DIAGNOSIS — Z9221 Personal history of antineoplastic chemotherapy: Secondary | ICD-10-CM | POA: Diagnosis not present

## 2016-02-20 NOTE — Addendum Note (Signed)
Encounter addended by: Scarlette Calico, RN on: 02/20/2016 11:30 AM<BR>    Actions taken: Order Entry activity accessed, Diagnosis association updated

## 2016-02-20 NOTE — Progress Notes (Signed)
Patient ID: Catherine Lawson, female   DOB: 06-Jul-1956, 60 y.o.   MRN: [id number] Patient ID: Catherine Lawson, female   DOB: [birth year], 60 y.o.   MRN: [id number]   [facility] CONSULT NOTE  Referring Physician: Magrinat Primary Care: Theadore Nan  HPI:  Catherine Lawson is a 60 y.o. woman with no previous significant medical history who was diagnosed with  right breast cancer 02/02/2015 for a clinically multifocal T1c NX, stage IA invasive ductal carcinoma, grade 2 or 3, estrogen and progesterone receptor positive, with HER-2 not amplified and the MIB-1 at 40%. She is referred for enrollment into the cardio-oncology clinic for surveillance while getting Herceptin.   Breast CA history as follows:   (1) biopsy of a suspicious left breast mass 03/28/2015 showed only usual ductal hyperplasia, no malignancy identified.  (2) genetics testing 03/06/2015 through the Breast/Ovarian gene panel offered by [facility] found no deleterious mutations in ATM, BARD1, BRCA1, BRCA2, BRIP1, CDH1, CHEK2, EPCAM, FANCC, MLH1, MSH2, MSH6, NBN, PALB2, PMS2, PTEN, RAD51C, RAD51D, TP53, and XRCC2. (a) there was a Variant of Unknown Significance in the BRCA2 gene called [id number]  (3) status post right lumpectomy and sentinel lymph node sampling 05/29/2015 for an mpT1b pN0, stage IA invasive ductal carcinoma, grade 2, with positive margins (a) margins cleared with subsequent surgery 06/12/2015  (4) adjuvant chemotherapy to consist of cyclophosphamide and docetaxel 4, given 3 weeks apart, starting 06/27/2015.  (5) FISH panel returned HER-2 positive. Will begin trastuzumab every 3 weeks through 1 year, starting 08/08/14.  (a) echocardiogram on 07/11/15 showed an EF of 60-65%  (6) adjuvant radiation to follow chemotherapy  (7) received anastrozole 02/08/2015 through December 2016, to resume at the completion of radiation  Here for f/u. Finished XRT in 4/17. Had SVT ablation with Dr.  Rayann Heman in 5/17 with two foci ablated. Otherwise tolerating Herceptin well. Off flecainide and carvedilol.    Echo 07/11/15: EF 60-65% LATERAL S' 12.8 gls -19.0 Echo 4/17: EF 55-60%, lateral s' 11.6, GLS -18%, normal RV size and systolic function, mild MR.  Echo 7/17: EF 60-65% lateral s' 9.8 GLS -18.4% normal RV  Review of Systems: All systems reviewed and negative except as per HPI.   Past Medical History:  Diagnosis Date  . Arthritis    bil thumb joints  . Breast cancer ([place])   . Breast cancer of upper-outer quadrant of right female breast ([place]) 02/06/2015  . Family history of breast cancer   . Family history of colon cancer   . Family history of ovarian cancer   . Family history of stomach cancer   . SVT (supraventricular tachycardia) ([facility])    fast HR at times due to stress, no chest pain or SOB   Social History   Social History  . Marital status: Married    Spouse name: Event organiser  . Number of children: 0  . Years of education: N/A   Occupational History  . Not on file.   Social History Main Topics  . Smoking status: Never Smoker  . Smokeless tobacco: Not on file  . Alcohol use Yes     Comment: social  . Drug use: No  . Sexual activity: Yes   Other Topics Concern  . Not on file   Social History Narrative   Lives in [place]   Retired   married   Family History  Problem Relation Age of Onset  . Breast cancer Sister 10  . Colon cancer Father 51  . Cancer Maternal Grandmother  probable ovarian cancer  . Stomach cancer Paternal Grandmother 79  . Brain cancer Other 10    Medulloblastoma  . Parkinson's disease Mother   . Lung cancer Paternal Uncle     smoker  . Stomach cancer Other     PGMs sister  . Stomach cancer Other     PMGs brother   Current Outpatient Prescriptions  Medication Sig Dispense Refill  . anastrozole (ARIMIDEX) 1 MG tablet Take 1 tablet (1 mg total) by mouth daily. 90 tablet 3  . cetirizine (ZYRTEC) 10 MG tablet Take 10 mg by mouth  daily.    . Multiple Vitamin (MULTIVITAMIN) tablet Take 1 tablet by mouth daily.    . Omega-3 Fatty Acids (FISH OIL OMEGA-3 PO) Take 2 tablets by mouth daily.    Marland Kitchen ketotifen (ZADITOR) 0.025 % ophthalmic solution Place 2 drops into both eyes daily as needed (allergies).    Marland Kitchen lidocaine-prilocaine (EMLA) cream Apply 1 application topically daily as needed (Eczema).      No current facility-administered medications for this encounter.     No Known Allergies    Social History   Social History  . Marital status: Married    Spouse name: Event organiser  . Number of children: 0  . Years of education: N/A   Occupational History  . Not on file.   Social History Main Topics  . Smoking status: Never Smoker  . Smokeless tobacco: Not on file  . Alcohol use Yes     Comment: social  . Drug use: No  . Sexual activity: Yes   Other Topics Concern  . Not on file   Social History Narrative   Lives in [place]   Retired   married      Family History  Problem Relation Age of Onset  . Breast cancer Sister 34  . Colon cancer Father 40  . Cancer Maternal Grandmother     probable ovarian cancer  . Stomach cancer Paternal Grandmother 60  . Brain cancer Other 10    Medulloblastoma  . Parkinson's disease Mother   . Lung cancer Paternal Uncle     smoker  . Stomach cancer Other     PGMs sister  . Stomach cancer Other     PMGs brother    Vitals:   02/20/16 1051  BP: 116/64  Pulse: 72  SpO2: 100%  Weight: 138 lb 8 oz (62.8 kg)    PHYSICAL EXAM: General:  Well appearing. No respiratory difficulty HEENT: normal Neck: supple. no JVD. Carotids 2+ bilat; no bruits. No lymphadenopathy or thryomegaly appreciated. Cor: PMI nondisplaced. Regular rate & rhythm. No rubs, gallops or murmurs. Lungs: clear Abdomen: soft, nontender, nondistended. No hepatosplenomegaly. No bruits or masses. Good bowel sounds. Extremities: no cyanosis, clubbing, rash, edema Neuro: alert & oriented x 3, cranial nerves  grossly intact. moves all 4 extremities w/o difficulty. Affect pleasant.    ASSESSMENT & PLAN: 1. Breast cancer: She will be getting Herceptin until 1/18.  No dyspnea.  Echo today was reviewed, EF is normal and strain pattern shows no significant change from prior. Lateral s' down slightly but likely due to poor window.  She will continue Herceptin. - Return in 3 months for echo and office visit.  2. SVT: s/p ablation with Dr. Rayann Heman 5/17   Burk Hoctor,MD 02/20/2016

## 2016-02-20 NOTE — Patient Instructions (Signed)
We will contact you in 3 months to schedule your next appointment and echocardiogram  

## 2016-02-20 NOTE — Addendum Note (Signed)
Encounter addended by: Scarlette Calico, RN on: 02/20/2016 11:28 AM<BR>    Actions taken: Sign clinical note

## 2016-03-04 ENCOUNTER — Other Ambulatory Visit: Payer: Self-pay | Admitting: *Deleted

## 2016-03-04 DIAGNOSIS — C50411 Malignant neoplasm of upper-outer quadrant of right female breast: Secondary | ICD-10-CM

## 2016-03-05 ENCOUNTER — Ambulatory Visit (HOSPITAL_BASED_OUTPATIENT_CLINIC_OR_DEPARTMENT_OTHER): Payer: Managed Care, Other (non HMO)

## 2016-03-05 ENCOUNTER — Ambulatory Visit: Payer: Self-pay

## 2016-03-05 ENCOUNTER — Encounter: Payer: Self-pay | Admitting: *Deleted

## 2016-03-05 ENCOUNTER — Ambulatory Visit (HOSPITAL_BASED_OUTPATIENT_CLINIC_OR_DEPARTMENT_OTHER): Payer: Managed Care, Other (non HMO) | Admitting: Oncology

## 2016-03-05 ENCOUNTER — Other Ambulatory Visit (HOSPITAL_BASED_OUTPATIENT_CLINIC_OR_DEPARTMENT_OTHER): Payer: Managed Care, Other (non HMO)

## 2016-03-05 ENCOUNTER — Telehealth: Payer: Self-pay | Admitting: Oncology

## 2016-03-05 VITALS — BP 137/62 | HR 71 | Temp 98.2°F | Resp 18 | Ht 65.0 in | Wt 141.2 lb

## 2016-03-05 DIAGNOSIS — Z5112 Encounter for antineoplastic immunotherapy: Secondary | ICD-10-CM

## 2016-03-05 DIAGNOSIS — Z95828 Presence of other vascular implants and grafts: Secondary | ICD-10-CM

## 2016-03-05 DIAGNOSIS — C50411 Malignant neoplasm of upper-outer quadrant of right female breast: Secondary | ICD-10-CM

## 2016-03-05 DIAGNOSIS — Z79811 Long term (current) use of aromatase inhibitors: Secondary | ICD-10-CM | POA: Diagnosis not present

## 2016-03-05 DIAGNOSIS — Z170 Estrogen receptor positive status [ER+]: Secondary | ICD-10-CM | POA: Diagnosis not present

## 2016-03-05 DIAGNOSIS — M8580 Other specified disorders of bone density and structure, unspecified site: Secondary | ICD-10-CM

## 2016-03-05 LAB — CBC WITH DIFFERENTIAL/PLATELET
BASO%: 1.1 % (ref 0.0–2.0)
Basophils Absolute: 0.1 10*3/uL (ref 0.0–0.1)
EOS ABS: 0.2 10*3/uL (ref 0.0–0.5)
EOS%: 2.8 % (ref 0.0–7.0)
HCT: 39.7 % (ref 34.8–46.6)
HEMOGLOBIN: 13.3 g/dL (ref 11.6–15.9)
LYMPH%: 25.9 % (ref 14.0–49.7)
MCH: 29.6 pg (ref 25.1–34.0)
MCHC: 33.4 g/dL (ref 31.5–36.0)
MCV: 88.8 fL (ref 79.5–101.0)
MONO#: 0.4 10*3/uL (ref 0.1–0.9)
MONO%: 6.6 % (ref 0.0–14.0)
NEUT%: 63.6 % (ref 38.4–76.8)
NEUTROS ABS: 3.4 10*3/uL (ref 1.5–6.5)
Platelets: 235 10*3/uL (ref 145–400)
RBC: 4.47 10*6/uL (ref 3.70–5.45)
RDW: 14.1 % (ref 11.2–14.5)
WBC: 5.4 10*3/uL (ref 3.9–10.3)
lymph#: 1.4 10*3/uL (ref 0.9–3.3)

## 2016-03-05 LAB — COMPREHENSIVE METABOLIC PANEL
ALBUMIN: 3.6 g/dL (ref 3.5–5.0)
ALK PHOS: 118 U/L (ref 40–150)
ALT: 23 U/L (ref 0–55)
AST: 24 U/L (ref 5–34)
Anion Gap: 8 mEq/L (ref 3–11)
BILIRUBIN TOTAL: 0.51 mg/dL (ref 0.20–1.20)
BUN: 15.6 mg/dL (ref 7.0–26.0)
CO2: 24 meq/L (ref 22–29)
CREATININE: 0.8 mg/dL (ref 0.6–1.1)
Calcium: 9.1 mg/dL (ref 8.4–10.4)
Chloride: 111 mEq/L — ABNORMAL HIGH (ref 98–109)
EGFR: 76 mL/min/{1.73_m2} — ABNORMAL LOW (ref >90)
GLUCOSE: 95 mg/dL (ref 70–140)
Potassium: 4.6 mEq/L (ref 3.5–5.1)
SODIUM: 143 meq/L (ref 136–145)
TOTAL PROTEIN: 6.5 g/dL (ref 6.4–8.3)

## 2016-03-05 MED ORDER — ACETAMINOPHEN 325 MG PO TABS
650.0000 mg | ORAL_TABLET | Freq: Once | ORAL | Status: AC
  Administered 2016-03-05: 650 mg via ORAL

## 2016-03-05 MED ORDER — SODIUM CHLORIDE 0.9% FLUSH
10.0000 mL | INTRAVENOUS | Status: DC | PRN
  Administered 2016-03-05: 10 mL
  Filled 2016-03-05: qty 10

## 2016-03-05 MED ORDER — SODIUM CHLORIDE 0.9 % IJ SOLN
10.0000 mL | INTRAMUSCULAR | Status: DC | PRN
  Administered 2016-03-05: 10 mL
  Filled 2016-03-05: qty 10

## 2016-03-05 MED ORDER — DIPHENHYDRAMINE HCL 25 MG PO CAPS
25.0000 mg | ORAL_CAPSULE | Freq: Once | ORAL | Status: AC
  Administered 2016-03-05: 25 mg via ORAL

## 2016-03-05 MED ORDER — DIPHENHYDRAMINE HCL 25 MG PO CAPS
ORAL_CAPSULE | ORAL | Status: AC
  Filled 2016-03-05: qty 1

## 2016-03-05 MED ORDER — SODIUM CHLORIDE 0.9 % IV SOLN
Freq: Once | INTRAVENOUS | Status: AC
  Administered 2016-03-05: 11:00:00 via INTRAVENOUS

## 2016-03-05 MED ORDER — ACETAMINOPHEN 325 MG PO TABS
ORAL_TABLET | ORAL | Status: AC
  Filled 2016-03-05: qty 2

## 2016-03-05 MED ORDER — HEPARIN SOD (PORK) LOCK FLUSH 100 UNIT/ML IV SOLN
500.0000 [IU] | Freq: Once | INTRAVENOUS | Status: AC | PRN
  Administered 2016-03-05: 500 [IU]
  Filled 2016-03-05: qty 5

## 2016-03-05 MED ORDER — TRASTUZUMAB CHEMO 150 MG IV SOLR
6.0000 mg/kg | Freq: Once | INTRAVENOUS | Status: AC
  Administered 2016-03-05: 399 mg via INTRAVENOUS
  Filled 2016-03-05: qty 19

## 2016-03-05 NOTE — Telephone Encounter (Signed)
appt made and avs printed. Staff msg sent to authorize echo. [place] scheduled for today at 230 pm with [facility]

## 2016-03-05 NOTE — Patient Instructions (Signed)
[facility] Discharge Instructions for Patients Receiving Chemotherapy  Today you received the following chemotherapy agents: Herceptin   To help prevent nausea and vomiting after your treatment, we encourage you to take your nausea medication as directed.    If you develop nausea and vomiting that is not controlled by your nausea medication, call the clinic.   BELOW ARE SYMPTOMS THAT SHOULD BE REPORTED IMMEDIATELY:  *FEVER GREATER THAN 100.5 F  *CHILLS WITH OR WITHOUT FEVER  NAUSEA AND VOMITING THAT IS NOT CONTROLLED WITH YOUR NAUSEA MEDICATION  *UNUSUAL SHORTNESS OF BREATH  *UNUSUAL BRUISING OR BLEEDING  TENDERNESS IN MOUTH AND THROAT WITH OR WITHOUT PRESENCE OF ULCERS  *URINARY PROBLEMS  *BOWEL PROBLEMS  UNUSUAL RASH Items with * indicate a potential emergency and should be followed up as soon as possible.  Feel free to call the clinic you have any questions or concerns. The clinic phone number is [phone].  Please show the CHEMO ALERT CARD at check-in to the Emergency Department and triage nurse.   

## 2016-03-05 NOTE — Progress Notes (Signed)
[place]  [phone] [phone]   ID: Catherine Lawson DOB: 1956/02/16  MR#: [id number]  OPF#:[id number]  Patient Care Team: Cari Caraway, MD as PCP - General (Family Medicine) Excell Seltzer, MD as Consulting Physician (General Surgery) Chauncey Cruel, MD as Consulting Physician (Oncology) Kyung Rudd, MD as Consulting Physician (Radiation Oncology) Mauro Kaufmann, RN as Registered Nurse Catherine Germany, RN as Registered Nurse Sylvan Cheese, NP as Nurse Practitioner (Nurse Practitioner) Vania Rea, MD as Consulting Physician (Obstetrics and Gynecology) Thompson Grayer, MD as Consulting Physician (Cardiology) PCP: Cari Caraway, MD OTHER MD:  CHIEF COMPLAINT: Estrogen receptor positive breast cancer  CURRENT TREATMENT: trastuzumab, anastrozole  BREAST CANCER HISTORY: From the original intake note:     "Catherine Lawson" had routine screening mammography at [facility] 01/24/2015. This showed a 1.6 cm mass in the right breast at the 11:00 position, and on 0.8 cm irregular density also at the 11:00 position farther away from the nipple. On 02/01/2015 she underwent a unilateral right diagnostic mammography at [facility]. The breast density was category B. In addition to the 2 masses previously noted there was a cluster of calcifications at the 9:00 position. Ultrasonography the same day showed a 1.9 cm oval mass, which was hypoechoic with no vascularity and hard on L a Stogner if he, a 7 mm mass at the 10:00 position with no vascularity and intermediate L a Stogner 3, and a lymph node with uniform cortex thickening in the right axilla. The 2 masses in question were separated by 2 cm.  On 02/02/2015 the patient underwent biopsy of what appears to have been the larger of the 2 breast masses (I cannot locate the biopsy report). This documented invasive ductal carcinoma, grade 2 or 3, with micropapillary features, estrogen and progesterone receptor positive, with an  MIB-1 of 40%, and HER-2 not amplified with a signals ratio of 1.36 and a number per cell of 3.60. There was evidence of lymphovascular invasion.  Because of bleeding from the first biopsy, the second breast mass could not be performed. The lymph node was aspirated, not cord, and this appeared benign but the concordance is questionable.  The patient's subsequent history is as detailed below  INTERVAL HISTORY: Catherine Lawson returns today for follow-up of her estrogen and HER-2 positive breast cancer, accompanied by her husband.. She continues on trastuzumab every 21 days, with a dose due today. She tolerates that with no side effects that she is aware of and she just had an echocardiogram 02/12/2016 which shows a well-preserved ejection fraction.  She is also on anastrozole. She tolerates that well except she is being discharged $200 a month for that drug. We have requested samples for her and she will receive that today. I will also give her a written prescription that she continues to "shop" this drug outside of her insurance contract. She is a member of [employer] and likely should be able to obtain it at a much better price there  REVIEW OF SYSTEMS:  She does have problems with vaginal dryness. She also has developed some eczema over the upper extremities and ankles. These are sun exposed areas so you wonder if there is a contribution from that point of view. She has been started on a couple of symptomatic medications by her allergist. She has a little bit of low back pain, which she treats with stretching exercises. She has a lump in the right side area that she wants me to check out. Otherwise a detailed review of  systems was stable   PAST MEDICAL HISTORY: Past Medical History:  Diagnosis Date  . Arthritis    bil thumb joints  . Breast cancer ([place])   . Breast cancer of upper-outer quadrant of right female breast ([place]) 02/06/2015  . Family history of breast cancer   . Family history of colon cancer     . Family history of ovarian cancer   . Family history of stomach cancer   . SVT (supraventricular tachycardia) ([facility])    fast HR at times due to stress, no chest pain or SOB    PAST SURGICAL HISTORY: Past Surgical History:  Procedure Laterality Date  . ELECTROPHYSIOLOGIC STUDY N/A 11/28/2015   left lateral accessory pathway and slow pathway ablation performed by Dr Rayann Heman  . PORTACATH PLACEMENT Right 06/27/2015   Procedure: INSERTION PORT-A-CATH;  Surgeon: Excell Seltzer, MD;  Location: [place];  Service: General;  Laterality: Right;  . RADIOACTIVE SEED GUIDED MASTECTOMY WITH AXILLARY SENTINEL LYMPH NODE BIOPSY Right 05/29/2015   Procedure: RADIOACTIVE SEED RIGHT BREAST LUMPECTOMY WITH AXILLARY SENTINEL LYMPH NODE BIOPSY;  Surgeon: Excell Seltzer, MD;  Location: [place];  Service: General;  Laterality: Right;  . RE-EXCISION OF BREAST LUMPECTOMY Right 06/12/2015   Procedure: RE-EXCISION OF RIGHT  BREAST LUMPECTOMY;  Surgeon: Excell Seltzer, MD;  Location: [facility];  Service: General;  Laterality: Right;  . WISDOM TOOTH EXTRACTION      FAMILY HISTORY Family History  Problem Relation Age of Onset  . Breast cancer Sister 67  . Colon cancer Father 35  . Cancer Maternal Grandmother     probable ovarian cancer  . Stomach cancer Paternal Grandmother 5  . Brain cancer Other 10    Medulloblastoma  . Parkinson's disease Mother   . Lung cancer Paternal Uncle     smoker  . Stomach cancer Other     PGMs sister  . Stomach cancer Other     PMGs brother   the patient's father is still living, at age 27. The patient's mother died from Parkinson's disease complications at age 67. The patient had 5 brothers, 4 sisters. One sister was diagnosed with breast cancer at age 61 and died at age 8. The patient's father was diagnosed with colon cancer at the age of 47. A nephew was diagnosed with medulloblastoma at age 56 and died at age 59. The paternal  grandmother was diagnosed with stomach cancer at age 33. The maternal grandmother was diagnosed with metastatic cancer of unknown type at age 27. (The description is suggestive of an ovarian cancer).  GYNECOLOGIC HISTORY:  No LMP recorded. Patient is postmenopausal. Menarche age 89. The patient is GX P0. She stopped having periods in 2014. She did not take hormone replacement. She took oral contraceptives for approximately 20 years with no complications.  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 Her husband [name] works for a Merchandiser, retail in [employer] [employer] credited Warehouse manager". [name] has 2 daughters from an earlier marriage, Ronalee Belts lives in [place] and works in recruiting, and Ameyah Bangura lives in [place] and works in Press photographer. They have 1 grandchild. The patient attends the [employer]   HEALTH MAINTENANCE: Social History  Substance Use Topics  . Smoking status: Never Smoker  . Smokeless tobacco: Not on file  . Alcohol use Yes     Comment: social     Colonoscopy: 2014/lobe are  PAP: 2015  Bone density: Remote  Lipid panel:  No Known Allergies  Current Outpatient Prescriptions  Medication Sig Dispense Refill  . anastrozole (ARIMIDEX) 1 MG tablet Take 1 tablet (1 mg total) by mouth daily. 90 tablet 3  . cetirizine (ZYRTEC) 10 MG tablet Take 10 mg by mouth daily.    Marland Kitchen ketotifen (ZADITOR) 0.025 % ophthalmic solution Place 2 drops into both eyes daily as needed (allergies).    Marland Kitchen lidocaine-prilocaine (EMLA) cream Apply 1 application topically daily as needed (Eczema).     . Multiple Vitamin (MULTIVITAMIN) tablet Take 1 tablet by mouth daily.    . Omega-3 Fatty Acids (FISH OIL OMEGA-3 PO) Take 2 tablets by mouth daily.     No current facility-administered medications for this visit.     OBJECTIVE: Middle-aged white woman Who appears well  Vitals:   03/05/16 0924  BP:  137/62  Pulse: 71  Resp: 18  Temp: 98.2 F (36.8 C)     Body mass index is 23.5 kg/m.    ECOG FS:1 - Symptomatic but completely ambulatory  Sclerae unicteric, pupils round and equal Oropharynx clear and moist-- no thrush or other lesions No cervical or supraclavicular adenopathy Lungs no rales or rhonchi Heart regular rate and rhythm Abd soft, nontender, positive bowel sounds MSK no focal spinal tenderness, no upper extremity lymphedema Neuro: nonfocal, well oriented, appropriate affect Breasts: The right breast is status post lumpectomy and radiation. There is no evidence of local recurrence. In the right axilla, at the lateral aspect of the right breast, there is a palpable lump, which she tells me was earlier erythematous and swollen, but is now simply firm. It is not tender. The left axilla and left breast are unremarkable   LAB RESULTS:  CMP     Component Value Date/Time   NA 141 02/13/2016 0849   K 4.3 02/13/2016 0849   CL 102 11/21/2015 1147   CO2 24 02/13/2016 0849   GLUCOSE 151 (H) 02/13/2016 0849   BUN 21.0 02/13/2016 0849   CREATININE 0.9 02/13/2016 0849   CALCIUM 8.9 02/13/2016 0849   PROT 6.5 02/13/2016 0849   ALBUMIN 3.7 02/13/2016 0849   AST 30 02/13/2016 0849   ALT 34 02/13/2016 0849   ALKPHOS 107 02/13/2016 0849   BILITOT 0.50 02/13/2016 0849   GFRNONAA >60 09/08/2015 0226   GFRAA >60 09/08/2015 0226    INo results found for: SPEP, UPEP  Lab Results  Component Value Date   WBC 5.4 03/05/2016   NEUTROABS 3.4 03/05/2016   HGB 13.3 03/05/2016   HCT 39.7 03/05/2016   MCV 88.8 03/05/2016   PLT 235 03/05/2016      Chemistry      Component Value Date/Time   NA 141 02/13/2016 0849   K 4.3 02/13/2016 0849   CL 102 11/21/2015 1147   CO2 24 02/13/2016 0849   BUN 21.0 02/13/2016 0849   CREATININE 0.9 02/13/2016 0849      Component Value Date/Time   CALCIUM 8.9 02/13/2016 0849   ALKPHOS 107 02/13/2016 0849   AST 30 02/13/2016 0849   ALT 34  02/13/2016 0849   BILITOT 0.50 02/13/2016 0849       No results found for: LABCA2  No components found for: LABCA125  No results for input(s): INR in the last 168 hours.  Urinalysis No results found for: COLORURINE, APPEARANCEUR, LABSPEC, PHURINE, GLUCOSEU, HGBUR, BILIRUBINUR, KETONESUR, PROTEINUR, UROBILINOGEN, NITRITE, LEUKOCYTESUR  STUDIES: No results found.  ASSESSMENT: 60 y.o. [place] woman status  post right breast upper outer quadrant biopsy 02/02/2015 for a clinically multifocal T1c NX, stage IA invasive ductal carcinoma, grade 2 or 3, estrogen and progesterone receptor positive, with HER-2 not amplified and the MIB-1 at 40%  (1) biopsy of a suspicious left breast mass 03/28/2015 showed only usual ductal hyperplasia, no malignancy identified.  (2) genetics testing 03/06/2015 through the Breast/Ovarian gene panel offered by [facility] found no deleterious mutations in ATM, BARD1, BRCA1, BRCA2, BRIP1, CDH1, CHEK2, EPCAM, FANCC, MLH1, MSH2, MSH6, NBN, PALB2, PMS2, PTEN, RAD51C, RAD51D, TP53, and XRCC2.  (a) there was a Variant of Unknown Significance in the BRCA2 gene called [id number]  (3) status post right lumpectomy and sentinel lymph node sampling 05/29/2015 for an mpT1b pN0, stage IA invasive ductal carcinoma, grade 2, with positive margins  (a) margins cleared with subsequent surgery 06/12/2015  (3) Oncotype score of 35 predicts a risk of outside the breast recurrence within 10 years of 24% if the patient's only systemic therapy is tamoxifen for 5 years. It also predicts significant benefit from chemotherapy.  (4) adjuvant chemotherapy consisting of cyclophosphamide and docetaxel 4, given 3 weeks apart, starting 06/27/2015, completed 08/30/2015  (5) FISH panel returned HER-2 positive. Started  trastuzumab every 3 weeks through 1 year, starting 08/08/2014  (a) echocardiogram on 02/12/2016 showed an EF of 55-60%  (6) adjuvant radiation 09/26/2015 through  11/10/2015: Site/dose:   The patient initially received a dose of 50.4 Gy in 28 fractions to the breast using whole-breast tangent fields. This was delivered using a 3-D conformal technique. The patient then received a boost to the seroma. This delivered an additional 10 Gy in 5 fractions using a electron field. The total dose was 60.4 Gy.  (7) received anastrozole 02/08/2015 through December 2016, resumed at the completion of radiation  (a) DEXA scan at [facility] 03/28/2015 showed osteopenia, with a T score of -2.1  PLAN: Catherine Lawson is now approximately 9 months out from definitive surgery for breast cancer, with no evidence of recurrence. This is very favorable.  She has a firm nodule in the right axilla. This was tender and erythematous earlier. It has now settled down but left what most likely is a scar. Nevertheless we are going to obtain an ultrasound of this area within the next week to evaluate it further.  She is tolerating her Herceptin without any side effects and repeat echocardiogram shows a well-preserved ejection fraction. She will continue to receive Herceptin every 21 days through December.  Today we discussed why we are not adding pertuzumab and the reason of course is that it adds minimally to the wrist reduction and can significantly increased side effects  She is having some vaginal dryness issues and I'm referring her back to our intimacy and pelvic health program.  I don't think her eczema is going to be related to her treatments. She has had these problems since childhood. I do think that there may be a sun exposure issue and I have asked her to stay out of the sun as much as she can.  She has been painting up to $200 a month for anastrozole. Today we gave her samples. I will write her a prescription that she can use without her insurance at [facility] [employer] [facility] when she runs out of samples for this medication.  Otherwise she will return to see me in late October with one of her Herceptin  treatments with an echo prior to that visit  She knows to call for any problems that may develop before then.  Chauncey Cruel, MD 03/05/16  We can go up on that is not a problem [place]

## 2016-03-05 NOTE — Patient Instructions (Signed)

## 2016-03-19 ENCOUNTER — Other Ambulatory Visit: Payer: Self-pay | Admitting: Obstetrics & Gynecology

## 2016-03-21 LAB — CYTOLOGY - PAP

## 2016-03-26 ENCOUNTER — Ambulatory Visit: Payer: Managed Care, Other (non HMO)

## 2016-03-26 ENCOUNTER — Ambulatory Visit (HOSPITAL_BASED_OUTPATIENT_CLINIC_OR_DEPARTMENT_OTHER): Payer: Managed Care, Other (non HMO)

## 2016-03-26 ENCOUNTER — Other Ambulatory Visit (HOSPITAL_BASED_OUTPATIENT_CLINIC_OR_DEPARTMENT_OTHER): Payer: Managed Care, Other (non HMO)

## 2016-03-26 ENCOUNTER — Encounter: Payer: Self-pay | Admitting: *Deleted

## 2016-03-26 VITALS — BP 114/72 | HR 67 | Temp 97.9°F | Resp 20

## 2016-03-26 DIAGNOSIS — C50411 Malignant neoplasm of upper-outer quadrant of right female breast: Secondary | ICD-10-CM

## 2016-03-26 DIAGNOSIS — Z5112 Encounter for antineoplastic immunotherapy: Secondary | ICD-10-CM

## 2016-03-26 DIAGNOSIS — M8580 Other specified disorders of bone density and structure, unspecified site: Secondary | ICD-10-CM

## 2016-03-26 DIAGNOSIS — Z95828 Presence of other vascular implants and grafts: Secondary | ICD-10-CM

## 2016-03-26 LAB — CBC WITH DIFFERENTIAL/PLATELET
BASO%: 0.9 % (ref 0.0–2.0)
Basophils Absolute: 0.1 10*3/uL (ref 0.0–0.1)
EOS ABS: 0.2 10*3/uL (ref 0.0–0.5)
EOS%: 3.4 % (ref 0.0–7.0)
HEMATOCRIT: 37.4 % (ref 34.8–46.6)
HGB: 13 g/dL (ref 11.6–15.9)
LYMPH#: 2.5 10*3/uL (ref 0.9–3.3)
LYMPH%: 35.7 % (ref 14.0–49.7)
MCH: 30.6 pg (ref 25.1–34.0)
MCHC: 34.8 g/dL (ref 31.5–36.0)
MCV: 88 fL (ref 79.5–101.0)
MONO#: 0.5 10*3/uL (ref 0.1–0.9)
MONO%: 6.4 % (ref 0.0–14.0)
NEUT%: 53.6 % (ref 38.4–76.8)
NEUTROS ABS: 3.8 10*3/uL (ref 1.5–6.5)
PLATELETS: 237 10*3/uL (ref 145–400)
RBC: 4.25 10*6/uL (ref 3.70–5.45)
RDW: 13 % (ref 11.2–14.5)
WBC: 7.1 10*3/uL (ref 3.9–10.3)

## 2016-03-26 LAB — COMPREHENSIVE METABOLIC PANEL
ALT: 22 U/L (ref 0–55)
AST: 19 U/L (ref 5–34)
Albumin: 3.3 g/dL — ABNORMAL LOW (ref 3.5–5.0)
Alkaline Phosphatase: 93 U/L (ref 40–150)
Anion Gap: 10 mEq/L (ref 3–11)
BUN: 16 mg/dL (ref 7.0–26.0)
CHLORIDE: 108 meq/L (ref 98–109)
CO2: 24 mEq/L (ref 22–29)
Calcium: 8.4 mg/dL (ref 8.4–10.4)
Creatinine: 0.8 mg/dL (ref 0.6–1.1)
EGFR: 81 mL/min/{1.73_m2} — AB (ref >90)
GLUCOSE: 105 mg/dL (ref 70–140)
POTASSIUM: 4.2 meq/L (ref 3.5–5.1)
SODIUM: 142 meq/L (ref 136–145)
Total Bilirubin: 0.35 mg/dL (ref 0.20–1.20)
Total Protein: 6.4 g/dL (ref 6.4–8.3)

## 2016-03-26 MED ORDER — SODIUM CHLORIDE 0.9% FLUSH
10.0000 mL | INTRAVENOUS | Status: DC | PRN
  Administered 2016-03-26: 10 mL
  Filled 2016-03-26: qty 10

## 2016-03-26 MED ORDER — SODIUM CHLORIDE 0.9 % IV SOLN
Freq: Once | INTRAVENOUS | Status: AC
  Administered 2016-03-26: 10:00:00 via INTRAVENOUS

## 2016-03-26 MED ORDER — TRASTUZUMAB CHEMO 150 MG IV SOLR
6.0000 mg/kg | Freq: Once | INTRAVENOUS | Status: AC
  Administered 2016-03-26: 399 mg via INTRAVENOUS
  Filled 2016-03-26: qty 19

## 2016-03-26 MED ORDER — DIPHENHYDRAMINE HCL 25 MG PO CAPS
ORAL_CAPSULE | ORAL | Status: AC
  Filled 2016-03-26: qty 1

## 2016-03-26 MED ORDER — ACETAMINOPHEN 325 MG PO TABS
650.0000 mg | ORAL_TABLET | Freq: Once | ORAL | Status: AC
  Administered 2016-03-26: 650 mg via ORAL

## 2016-03-26 MED ORDER — SODIUM CHLORIDE 0.9 % IJ SOLN
10.0000 mL | INTRAMUSCULAR | Status: DC | PRN
  Administered 2016-03-26: 10 mL
  Filled 2016-03-26: qty 10

## 2016-03-26 MED ORDER — DIPHENHYDRAMINE HCL 25 MG PO CAPS
25.0000 mg | ORAL_CAPSULE | Freq: Once | ORAL | Status: AC
Start: 2016-03-26 — End: 2016-03-26
  Administered 2016-03-26: 25 mg via ORAL

## 2016-03-26 MED ORDER — ACETAMINOPHEN 325 MG PO TABS
ORAL_TABLET | ORAL | Status: AC
  Filled 2016-03-26: qty 2

## 2016-03-26 MED ORDER — HEPARIN SOD (PORK) LOCK FLUSH 100 UNIT/ML IV SOLN
500.0000 [IU] | Freq: Once | INTRAVENOUS | Status: AC | PRN
  Administered 2016-03-26: 500 [IU]
  Filled 2016-03-26: qty 5

## 2016-03-26 NOTE — Patient Instructions (Signed)

## 2016-03-26 NOTE — Patient Instructions (Signed)
[facility] Discharge Instructions for Patients Receiving Chemotherapy  Today you received the following chemotherapy agents:  Herceptin  To help prevent nausea and vomiting after your treatment, we encourage you to take your nausea medication as ordered per MD.   If you develop nausea and vomiting that is not controlled by your nausea medication, call the clinic.   BELOW ARE SYMPTOMS THAT SHOULD BE REPORTED IMMEDIATELY:  *FEVER GREATER THAN 100.5 F  *CHILLS WITH OR WITHOUT FEVER  NAUSEA AND VOMITING THAT IS NOT CONTROLLED WITH YOUR NAUSEA MEDICATION  *UNUSUAL SHORTNESS OF BREATH  *UNUSUAL BRUISING OR BLEEDING  TENDERNESS IN MOUTH AND THROAT WITH OR WITHOUT PRESENCE OF ULCERS  *URINARY PROBLEMS  *BOWEL PROBLEMS  UNUSUAL RASH Items with * indicate a potential emergency and should be followed up as soon as possible.  Feel free to call the clinic you have any questions or concerns. The clinic phone number is [phone].  Please show the CHEMO ALERT CARD at check-in to the Emergency Department and triage nurse.   

## 2016-04-16 ENCOUNTER — Other Ambulatory Visit (HOSPITAL_BASED_OUTPATIENT_CLINIC_OR_DEPARTMENT_OTHER): Payer: Managed Care, Other (non HMO)

## 2016-04-16 ENCOUNTER — Ambulatory Visit (HOSPITAL_BASED_OUTPATIENT_CLINIC_OR_DEPARTMENT_OTHER): Payer: Managed Care, Other (non HMO)

## 2016-04-16 ENCOUNTER — Ambulatory Visit: Payer: Managed Care, Other (non HMO)

## 2016-04-16 ENCOUNTER — Ambulatory Visit (HOSPITAL_BASED_OUTPATIENT_CLINIC_OR_DEPARTMENT_OTHER): Payer: Managed Care, Other (non HMO) | Admitting: Oncology

## 2016-04-16 VITALS — BP 124/60 | HR 68 | Temp 97.9°F | Resp 18 | Ht 65.0 in | Wt 139.5 lb

## 2016-04-16 DIAGNOSIS — C50411 Malignant neoplasm of upper-outer quadrant of right female breast: Secondary | ICD-10-CM

## 2016-04-16 DIAGNOSIS — Z170 Estrogen receptor positive status [ER+]: Principal | ICD-10-CM

## 2016-04-16 DIAGNOSIS — Z5112 Encounter for antineoplastic immunotherapy: Secondary | ICD-10-CM | POA: Diagnosis not present

## 2016-04-16 DIAGNOSIS — Z23 Encounter for immunization: Secondary | ICD-10-CM

## 2016-04-16 DIAGNOSIS — Z79811 Long term (current) use of aromatase inhibitors: Secondary | ICD-10-CM | POA: Diagnosis not present

## 2016-04-16 DIAGNOSIS — Z95828 Presence of other vascular implants and grafts: Secondary | ICD-10-CM

## 2016-04-16 LAB — COMPREHENSIVE METABOLIC PANEL
ALK PHOS: 100 U/L (ref 40–150)
ALT: 30 U/L (ref 0–55)
AST: 26 U/L (ref 5–34)
Albumin: 3.5 g/dL (ref 3.5–5.0)
Anion Gap: 10 mEq/L (ref 3–11)
BUN: 14.6 mg/dL (ref 7.0–26.0)
CO2: 23 mEq/L (ref 22–29)
Calcium: 9.2 mg/dL (ref 8.4–10.4)
Chloride: 108 mEq/L (ref 98–109)
Creatinine: 0.8 mg/dL (ref 0.6–1.1)
EGFR: 78 mL/min/{1.73_m2} — AB (ref >90)
Glucose: 112 mg/dl (ref 70–140)
POTASSIUM: 4.1 meq/L (ref 3.5–5.1)
SODIUM: 142 meq/L (ref 136–145)
TOTAL PROTEIN: 6.8 g/dL (ref 6.4–8.3)
Total Bilirubin: 0.51 mg/dL (ref 0.20–1.20)

## 2016-04-16 LAB — CBC WITH DIFFERENTIAL/PLATELET
BASO%: 1 % (ref 0.0–2.0)
Basophils Absolute: 0.1 10*3/uL (ref 0.0–0.1)
EOS%: 4.3 % (ref 0.0–7.0)
Eosinophils Absolute: 0.2 10*3/uL (ref 0.0–0.5)
HEMATOCRIT: 41.8 % (ref 34.8–46.6)
HGB: 14.1 g/dL (ref 11.6–15.9)
LYMPH#: 1.9 10*3/uL (ref 0.9–3.3)
LYMPH%: 34.5 % (ref 14.0–49.7)
MCH: 29.9 pg (ref 25.1–34.0)
MCHC: 33.6 g/dL (ref 31.5–36.0)
MCV: 88.8 fL (ref 79.5–101.0)
MONO#: 0.3 10*3/uL (ref 0.1–0.9)
MONO%: 6.1 % (ref 0.0–14.0)
NEUT%: 54.1 % (ref 38.4–76.8)
NEUTROS ABS: 2.9 10*3/uL (ref 1.5–6.5)
PLATELETS: 234 10*3/uL (ref 145–400)
RBC: 4.71 10*6/uL (ref 3.70–5.45)
RDW: 13.4 % (ref 11.2–14.5)
WBC: 5.4 10*3/uL (ref 3.9–10.3)

## 2016-04-16 MED ORDER — ACETAMINOPHEN 325 MG PO TABS
650.0000 mg | ORAL_TABLET | Freq: Once | ORAL | Status: AC
  Administered 2016-04-16: 650 mg via ORAL

## 2016-04-16 MED ORDER — DIPHENHYDRAMINE HCL 25 MG PO CAPS
25.0000 mg | ORAL_CAPSULE | Freq: Once | ORAL | Status: AC
  Administered 2016-04-16: 25 mg via ORAL

## 2016-04-16 MED ORDER — INFLUENZA VAC SPLIT QUAD 0.5 ML IM SUSY
0.5000 mL | PREFILLED_SYRINGE | Freq: Once | INTRAMUSCULAR | Status: AC
  Administered 2016-04-16: 0.5 mL via INTRAMUSCULAR
  Filled 2016-04-16: qty 0.5

## 2016-04-16 MED ORDER — DIPHENHYDRAMINE HCL 25 MG PO CAPS
ORAL_CAPSULE | ORAL | Status: AC
  Filled 2016-04-16: qty 1

## 2016-04-16 MED ORDER — SODIUM CHLORIDE 0.9 % IJ SOLN
10.0000 mL | INTRAMUSCULAR | Status: DC | PRN
  Administered 2016-04-16: 10 mL
  Filled 2016-04-16: qty 10

## 2016-04-16 MED ORDER — ACETAMINOPHEN 325 MG PO TABS
ORAL_TABLET | ORAL | Status: AC
  Filled 2016-04-16: qty 2

## 2016-04-16 MED ORDER — SODIUM CHLORIDE 0.9% FLUSH
10.0000 mL | INTRAVENOUS | Status: DC | PRN
  Administered 2016-04-16: 10 mL via INTRAVENOUS
  Filled 2016-04-16: qty 10

## 2016-04-16 MED ORDER — SODIUM CHLORIDE 0.9 % IV SOLN
Freq: Once | INTRAVENOUS | Status: AC
  Administered 2016-04-16: 12:00:00 via INTRAVENOUS

## 2016-04-16 MED ORDER — HEPARIN SOD (PORK) LOCK FLUSH 100 UNIT/ML IV SOLN
500.0000 [IU] | Freq: Once | INTRAVENOUS | Status: AC | PRN
  Administered 2016-04-16: 500 [IU]
  Filled 2016-04-16: qty 5

## 2016-04-16 MED ORDER — TRASTUZUMAB CHEMO 150 MG IV SOLR
6.0000 mg/kg | Freq: Once | INTRAVENOUS | Status: AC
  Administered 2016-04-16: 399 mg via INTRAVENOUS
  Filled 2016-04-16: qty 19

## 2016-04-16 NOTE — Patient Instructions (Signed)
[facility] Discharge Instructions for Patients Receiving Chemotherapy  Today you received the following chemotherapy agents; Herceptin     BELOW ARE SYMPTOMS THAT SHOULD BE REPORTED IMMEDIATELY:  *FEVER GREATER THAN 100.5 F  *CHILLS WITH OR WITHOUT FEVER  NAUSEA AND VOMITING THAT IS NOT CONTROLLED WITH YOUR NAUSEA MEDICATION  *UNUSUAL SHORTNESS OF BREATH  *UNUSUAL BRUISING OR BLEEDING  TENDERNESS IN MOUTH AND THROAT WITH OR WITHOUT PRESENCE OF ULCERS  *URINARY PROBLEMS  *BOWEL PROBLEMS  UNUSUAL RASH Items with * indicate a potential emergency and should be followed up as soon as possible.  Feel free to call the clinic you have any questions or concerns. The clinic phone number is [phone].  Please show the CHEMO ALERT CARD at check-in to the Emergency Department and triage nurse.   

## 2016-04-16 NOTE — Progress Notes (Signed)
[place]  [phone] [phone]) [phone]   ID: Catherine Lawson DOB: 12-06-1955  MR#: [id number]  INO#:[id number]  Patient Care Team: Cari Caraway, MD as PCP - General (Family Medicine) Excell Seltzer, MD as Consulting Physician (General Surgery) Chauncey Cruel, MD as Consulting Physician (Oncology) Kyung Rudd, MD as Consulting Physician (Radiation Oncology) Mauro Kaufmann, RN as Registered Nurse Rockwell Germany, RN as Registered Nurse Sylvan Cheese, NP as Nurse Practitioner (Nurse Practitioner) Vania Rea, MD as Consulting Physician (Obstetrics and Gynecology) Thompson Grayer, MD as Consulting Physician (Cardiology) PCP: Cari Caraway, MD OTHER MD:  CHIEF COMPLAINT: Estrogen receptor positive breast cancer  CURRENT TREATMENT: trastuzumab, anastrozole  BREAST CANCER HISTORY: From the original intake note:     "Catherine Lawson" had routine screening mammography at [facility] 01/24/2015. This showed a 1.6 cm mass in the right breast at the 11:00 position, and on 0.8 cm irregular density also at the 11:00 position farther away from the nipple. On 02/01/2015 she underwent a unilateral right diagnostic mammography at [facility]. The breast density was category B. In addition to the 2 masses previously noted there was a cluster of calcifications at the 9:00 position. Ultrasonography the same day showed a 1.9 cm oval mass, which was hypoechoic with no vascularity and hard on L a Stogner if he, a 7 mm mass at the 10:00 position with no vascularity and intermediate L a Stogner 3, and a lymph node with uniform cortex thickening in the right axilla. The 2 masses in question were separated by 2 cm.  On 02/02/2015 the patient underwent biopsy of what appears to have been the larger of the 2 breast masses (I cannot locate the biopsy report). This documented invasive ductal carcinoma, grade 2 or 3, with micropapillary features, estrogen and progesterone receptor positive, with an  MIB-1 of 40%, and HER-2 not amplified with a signals ratio of 1.36 and a number per cell of 3.60. There was evidence of lymphovascular invasion.  Because of bleeding from the first biopsy, the second breast mass could not be performed. The lymph node was aspirated, not cord, and this appeared benign but the concordance is questionable.  The patient's subsequent history is as detailed below  INTERVAL HISTORY: Catherine Lawson returns today for follow-up of her triple positive breast cancer. She receives trastuzumab every 21 days, with a dose due today. She is tolerating that with no side effects that she is aware of and the port is working well. She had her most recent echocardiogram late July, showing a well-preserved ejection fraction.  She is also on anastrozole. Aside from dryness problems she tolerates that well. She obtains it at a very good price.  The "bump" she had in the axillae resolved on its own  REVIEW OF SYSTEMS:  Her hair is coming back white dark and strong. It is pretty curly. She really likes it. She had some eczema problems and she saw Dr. Remus Blake who started her on some new treatments and the eczema is much improved. Catherine Lawson still has some back pain and on the lower left side, but this comes and goes and it is not progressive. [facility] A detailed review of systems is otherwise stable  PAST MEDICAL HISTORY: Past Medical History:  Diagnosis Date  . Arthritis    bil thumb joints  . Breast cancer ([place])   . Breast cancer of upper-outer quadrant of right female breast ([place]) 02/06/2015  . Family history of  breast cancer   . Family history of colon cancer   . Family history of ovarian cancer   . Family history of stomach cancer   . SVT (supraventricular tachycardia) (HCC)    fast HR at times due to stress, no chest pain or SOB    PAST SURGICAL HISTORY: Past Surgical History:  Procedure Laterality Date  . ELECTROPHYSIOLOGIC STUDY N/A  11/28/2015   left lateral accessory pathway and slow pathway ablation performed by Dr Rayann Heman  . PORTACATH PLACEMENT Right 06/27/2015   Procedure: INSERTION PORT-A-CATH;  Surgeon: Excell Seltzer, MD;  Location: [place];  Service: General;  Laterality: Right;  . RADIOACTIVE SEED GUIDED MASTECTOMY WITH AXILLARY SENTINEL LYMPH NODE BIOPSY Right 05/29/2015   Procedure: RADIOACTIVE SEED RIGHT BREAST LUMPECTOMY WITH AXILLARY SENTINEL LYMPH NODE BIOPSY;  Surgeon: Excell Seltzer, MD;  Location: [place];  Service: General;  Laterality: Right;  . RE-EXCISION OF BREAST LUMPECTOMY Right 06/12/2015   Procedure: RE-EXCISION OF RIGHT  BREAST LUMPECTOMY;  Surgeon: Excell Seltzer, MD;  Location: [facility];  Service: General;  Laterality: Right;  . WISDOM TOOTH EXTRACTION      FAMILY HISTORY Family History  Problem Relation Age of Onset  . Breast cancer Sister 108  . Colon cancer Father 4  . Cancer Maternal Grandmother     probable ovarian cancer  . Stomach cancer Paternal Grandmother 24  . Brain cancer Other 10    Medulloblastoma  . Parkinson's disease Mother   . Lung cancer Paternal Uncle     smoker  . Stomach cancer Other     PGMs sister  . Stomach cancer Other     PMGs brother   the patient's father is still living, at age 42. The patient's mother died from Parkinson's disease complications at age 2. The patient had 5 brothers, 4 sisters. One sister was diagnosed with breast cancer at age 14 and died at age 9. The patient's father was diagnosed with colon cancer at the age of 16. A nephew was diagnosed with medulloblastoma at age 6 and died at age 32. The paternal grandmother was diagnosed with stomach cancer at age 51. The maternal grandmother was diagnosed with metastatic cancer of unknown type at age 34. (The description is suggestive of an ovarian cancer).  GYNECOLOGIC HISTORY:  No LMP recorded. Patient is postmenopausal. Menarche age 72. The patient  is GX P0. She stopped having periods in 2014. She did not take hormone replacement. She took oral contraceptives for approximately 20 years with no complications.  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 Her husband Catherine Lawson works for a Merchandiser, retail in [employer] "[place] [employer] credited Warehouse manager". Catherine Lawson has 2 daughters from an earlier marriage, Ronalee Belts lives in [place] and works in recruiting, and Jeanmarie Mccowen lives in [place] and works in Press photographer. They have 1 grandchild. The patient attends the [employer]   HEALTH MAINTENANCE: Social History  Substance Use Topics  . Smoking status: Never Smoker  . Smokeless tobacco: Not on file  . Alcohol use Yes     Comment: social     Colonoscopy: 2014/lobe are  PAP: 2015  Bone density: Remote  Lipid panel:  No Known Allergies  Current Outpatient Prescriptions  Medication Sig Dispense Refill  . anastrozole (ARIMIDEX) 1 MG tablet Take 1 tablet (1 mg total) by mouth daily. 90 tablet 3  . Bepotastine Besilate 1.5 % SOLN     .  cetirizine (ZYRTEC) 10 MG tablet Take 10 mg by mouth daily.    . Crisaborole 2 % OINT Apply topically.    Marland Kitchen ketotifen (ZADITOR) 0.025 % ophthalmic solution Place 2 drops into both eyes daily as needed (allergies).    Marland Kitchen lidocaine-prilocaine (EMLA) cream Apply 1 application topically daily as needed (Eczema).     . Multiple Vitamin (MULTIVITAMIN) tablet Take 1 tablet by mouth daily.    . Omega-3 Fatty Acids (FISH OIL OMEGA-3 PO) Take 2 tablets by mouth daily.     No current facility-administered medications for this visit.     OBJECTIVE: Middle-aged white woman In no acute distress Vitals:   04/16/16 1034  BP: 124/60  Pulse: 68  Resp: 18  Temp: 97.9 F (36.6 C)     Body mass index is 23.21 kg/m.    ECOG FS:0 - Asymptomatic  Sclerae unicteric, EOMs intact Oropharynx clear and moist No cervical or supraclavicular  adenopathy Lungs no rales or rhonchi Heart regular rate and rhythm Abd soft, nontender, positive bowel sounds MSK no focal spinal tenderness, no upper extremity lymphedema Neuro: nonfocal, well oriented, appropriate affect Breasts: The right breast is status post lumpectomy and radiation. There is minimal distortion of the breast contour. The right axilla is benign, with no findings of concern. The left breast is unremarkable   LAB RESULTS:  CMP     Component Value Date/Time   NA 142 04/16/2016 0951   K 4.1 04/16/2016 0951   CL 102 11/21/2015 1147   CO2 23 04/16/2016 0951   GLUCOSE 112 04/16/2016 0951   BUN 14.6 04/16/2016 0951   CREATININE 0.8 04/16/2016 0951   CALCIUM 9.2 04/16/2016 0951   PROT 6.8 04/16/2016 0951   ALBUMIN 3.5 04/16/2016 0951   AST 26 04/16/2016 0951   ALT 30 04/16/2016 0951   ALKPHOS 100 04/16/2016 0951   BILITOT 0.51 04/16/2016 0951   GFRNONAA >60 09/08/2015 0226   GFRAA >60 09/08/2015 0226    INo results found for: SPEP, UPEP  Lab Results  Component Value Date   WBC 5.4 04/16/2016   NEUTROABS 2.9 04/16/2016   HGB 14.1 04/16/2016   HCT 41.8 04/16/2016   MCV 88.8 04/16/2016   PLT 234 04/16/2016      Chemistry      Component Value Date/Time   NA 142 04/16/2016 0951   K 4.1 04/16/2016 0951   CL 102 11/21/2015 1147   CO2 23 04/16/2016 0951   BUN 14.6 04/16/2016 0951   CREATININE 0.8 04/16/2016 0951      Component Value Date/Time   CALCIUM 9.2 04/16/2016 0951   ALKPHOS 100 04/16/2016 0951   AST 26 04/16/2016 0951   ALT 30 04/16/2016 0951   BILITOT 0.51 04/16/2016 0951       No results found for: LABCA2  No components found for: LABCA125  No results for input(s): INR in the last 168 hours.  Urinalysis No results found for: COLORURINE, APPEARANCEUR, LABSPEC, PHURINE, GLUCOSEU, HGBUR, BILIRUBINUR, KETONESUR, PROTEINUR, UROBILINOGEN, NITRITE, LEUKOCYTESUR  STUDIES: No results found.  ASSESSMENT: 60 y.o. [place] woman status  post right breast upper outer quadrant biopsy 02/02/2015 for a clinically multifocal T1c NX, stage IA invasive ductal carcinoma, grade 2 or 3, estrogen and progesterone receptor positive, with HER-2 not amplified and the MIB-1 at 40%  (1) biopsy of a suspicious left breast mass 03/28/2015 showed only usual ductal hyperplasia, no malignancy identified.  (2) genetics testing 03/06/2015 through the Breast/Ovarian gene panel offered by [facility] found no deleterious  mutations in ATM, BARD1, BRCA1, BRCA2, BRIP1, CDH1, CHEK2, EPCAM, FANCC, MLH1, MSH2, MSH6, NBN, PALB2, PMS2, PTEN, RAD51C, RAD51D, TP53, and XRCC2.  (a) there was a Variant of Unknown Significance in the BRCA2 gene called c.[id number]  (3) status post right lumpectomy and sentinel lymph node sampling 05/29/2015 for an mpT1b pN0, stage IA invasive ductal carcinoma, grade 2, with positive margins  (a) margins cleared with subsequent surgery 06/12/2015  (3) Oncotype score of 35 predicts a risk of outside the breast recurrence within 10 years of 24% if the patient's only systemic therapy is tamoxifen for 5 years. It also predicts significant benefit from chemotherapy.  (4) adjuvant chemotherapy consisting of cyclophosphamide and docetaxel 4, given 3 weeks apart, starting 06/27/2015, completed 08/30/2015  (5) FISH panel returned HER-2 positive. Started  trastuzumab every 3 weeks through 1 year, starting 08/08/2014  (a) echocardiogram on 02/12/2016 showed an EF of 55-60%  (6) adjuvant radiation 09/26/2015 through 11/10/2015: Site/dose:   The patient initially received a dose of 50.4 Gy in 28 fractions to the breast using whole-breast tangent fields. This was delivered using a 3-D conformal technique. The patient then received a boost to the seroma. This delivered an additional 10 Gy in 5 fractions using a electron field. The total dose was 60.4 Gy.  (7) received anastrozole 02/08/2015 through December 2016, resumed at the completion of  radiation  (a) DEXA scan at [facility] 03/28/2015 showed osteopenia, with a T score of -2.1  PLAN: Catherine Lawson is tolerating the Herceptin well and we will continue those treatments, with the last one scheduled for 06/18/2016 after that she will have her port removed.  She will have an echocardiogram, likely her last one, later this month or early next month.  The plan is to continue anastrozole for a total of 5 years. She will need a repeat bone density approximately a year from now.  She will see me again in January. She knows to call for any problems that may develop before that visit.   Chauncey Cruel, MD 04/16/16

## 2016-05-06 ENCOUNTER — Other Ambulatory Visit (HOSPITAL_COMMUNITY): Payer: Managed Care, Other (non HMO)

## 2016-05-07 ENCOUNTER — Ambulatory Visit (HOSPITAL_BASED_OUTPATIENT_CLINIC_OR_DEPARTMENT_OTHER): Payer: Managed Care, Other (non HMO)

## 2016-05-07 ENCOUNTER — Other Ambulatory Visit: Payer: Self-pay | Admitting: Oncology

## 2016-05-07 ENCOUNTER — Other Ambulatory Visit (HOSPITAL_BASED_OUTPATIENT_CLINIC_OR_DEPARTMENT_OTHER): Payer: Managed Care, Other (non HMO)

## 2016-05-07 VITALS — BP 123/62 | HR 63 | Temp 97.9°F | Resp 16

## 2016-05-07 DIAGNOSIS — C50411 Malignant neoplasm of upper-outer quadrant of right female breast: Secondary | ICD-10-CM

## 2016-05-07 DIAGNOSIS — Z170 Estrogen receptor positive status [ER+]: Principal | ICD-10-CM

## 2016-05-07 DIAGNOSIS — Z5112 Encounter for antineoplastic immunotherapy: Secondary | ICD-10-CM

## 2016-05-07 LAB — COMPREHENSIVE METABOLIC PANEL
ALBUMIN: 3.5 g/dL (ref 3.5–5.0)
ALK PHOS: 85 U/L (ref 40–150)
ALT: 34 U/L (ref 0–55)
AST: 35 U/L — ABNORMAL HIGH (ref 5–34)
Anion Gap: 11 mEq/L (ref 3–11)
BUN: 15.1 mg/dL (ref 7.0–26.0)
CALCIUM: 8.9 mg/dL (ref 8.4–10.4)
CO2: 23 mEq/L (ref 22–29)
Chloride: 108 mEq/L (ref 98–109)
Creatinine: 0.8 mg/dL (ref 0.6–1.1)
EGFR: 81 mL/min/{1.73_m2} — AB (ref >90)
GLUCOSE: 102 mg/dL (ref 70–140)
POTASSIUM: 4.2 meq/L (ref 3.5–5.1)
SODIUM: 142 meq/L (ref 136–145)
Total Bilirubin: 0.45 mg/dL (ref 0.20–1.20)
Total Protein: 6.5 g/dL (ref 6.4–8.3)

## 2016-05-07 LAB — CBC WITH DIFFERENTIAL/PLATELET
BASO%: 1.2 % (ref 0.0–2.0)
Basophils Absolute: 0.1 10*3/uL (ref 0.0–0.1)
EOS%: 3.7 % (ref 0.0–7.0)
Eosinophils Absolute: 0.2 10*3/uL (ref 0.0–0.5)
HEMATOCRIT: 39.7 % (ref 34.8–46.6)
HEMOGLOBIN: 13.4 g/dL (ref 11.6–15.9)
LYMPH#: 1.6 10*3/uL (ref 0.9–3.3)
LYMPH%: 32.2 % (ref 14.0–49.7)
MCH: 30.1 pg (ref 25.1–34.0)
MCHC: 33.8 g/dL (ref 31.5–36.0)
MCV: 89 fL (ref 79.5–101.0)
MONO#: 0.3 10*3/uL (ref 0.1–0.9)
MONO%: 5.7 % (ref 0.0–14.0)
NEUT%: 57.2 % (ref 38.4–76.8)
NEUTROS ABS: 2.9 10*3/uL (ref 1.5–6.5)
PLATELETS: 225 10*3/uL (ref 145–400)
RBC: 4.46 10*6/uL (ref 3.70–5.45)
RDW: 13.2 % (ref 11.2–14.5)
WBC: 5 10*3/uL (ref 3.9–10.3)

## 2016-05-07 MED ORDER — TRASTUZUMAB CHEMO 150 MG IV SOLR
6.0000 mg/kg | Freq: Once | INTRAVENOUS | Status: AC
  Administered 2016-05-07: 399 mg via INTRAVENOUS
  Filled 2016-05-07: qty 19

## 2016-05-07 MED ORDER — HEPARIN SOD (PORK) LOCK FLUSH 100 UNIT/ML IV SOLN
500.0000 [IU] | Freq: Once | INTRAVENOUS | Status: AC | PRN
  Administered 2016-05-07: 500 [IU]
  Filled 2016-05-07: qty 5

## 2016-05-07 MED ORDER — ACETAMINOPHEN 325 MG PO TABS
ORAL_TABLET | ORAL | Status: AC
  Filled 2016-05-07: qty 2

## 2016-05-07 MED ORDER — DIPHENHYDRAMINE HCL 25 MG PO CAPS
ORAL_CAPSULE | ORAL | Status: AC
  Filled 2016-05-07: qty 1

## 2016-05-07 MED ORDER — SODIUM CHLORIDE 0.9 % IV SOLN
Freq: Once | INTRAVENOUS | Status: AC
  Administered 2016-05-07: 09:00:00 via INTRAVENOUS

## 2016-05-07 MED ORDER — SODIUM CHLORIDE 0.9 % IJ SOLN
10.0000 mL | INTRAMUSCULAR | Status: DC | PRN
  Administered 2016-05-07: 10 mL
  Filled 2016-05-07: qty 10

## 2016-05-07 MED ORDER — DIPHENHYDRAMINE HCL 25 MG PO CAPS
25.0000 mg | ORAL_CAPSULE | Freq: Once | ORAL | Status: AC
  Administered 2016-05-07: 25 mg via ORAL

## 2016-05-07 MED ORDER — ACETAMINOPHEN 325 MG PO TABS
650.0000 mg | ORAL_TABLET | Freq: Once | ORAL | Status: AC
  Administered 2016-05-07: 650 mg via ORAL

## 2016-05-07 NOTE — Patient Instructions (Signed)
[facility] Discharge Instructions for Patients Receiving Chemotherapy  Today you received the following chemotherapy agents; Herceptin     BELOW ARE SYMPTOMS THAT SHOULD BE REPORTED IMMEDIATELY:  *FEVER GREATER THAN 100.5 F  *CHILLS WITH OR WITHOUT FEVER  NAUSEA AND VOMITING THAT IS NOT CONTROLLED WITH YOUR NAUSEA MEDICATION  *UNUSUAL SHORTNESS OF BREATH  *UNUSUAL BRUISING OR BLEEDING  TENDERNESS IN MOUTH AND THROAT WITH OR WITHOUT PRESENCE OF ULCERS  *URINARY PROBLEMS  *BOWEL PROBLEMS  UNUSUAL RASH Items with * indicate a potential emergency and should be followed up as soon as possible.  Feel free to call the clinic you have any questions or concerns. The clinic phone number is [phone].  Please show the CHEMO ALERT CARD at check-in to the Emergency Department and triage nurse.   

## 2016-05-10 ENCOUNTER — Encounter (HOSPITAL_COMMUNITY): Payer: Managed Care, Other (non HMO) | Admitting: Internal Medicine

## 2016-05-10 ENCOUNTER — Other Ambulatory Visit (HOSPITAL_COMMUNITY): Payer: Managed Care, Other (non HMO)

## 2016-05-28 ENCOUNTER — Ambulatory Visit (HOSPITAL_BASED_OUTPATIENT_CLINIC_OR_DEPARTMENT_OTHER): Payer: Managed Care, Other (non HMO)

## 2016-05-28 ENCOUNTER — Ambulatory Visit (HOSPITAL_COMMUNITY)
Admission: RE | Admit: 2016-05-28 | Discharge: 2016-05-28 | Disposition: A | Discharge status: Home / Self Care | Payer: Managed Care, Other (non HMO) | Source: Ambulatory Visit | Attending: Family Medicine | Admitting: Family Medicine

## 2016-05-28 ENCOUNTER — Ambulatory Visit (HOSPITAL_BASED_OUTPATIENT_CLINIC_OR_DEPARTMENT_OTHER)
Admission: RE | Admit: 2016-05-28 | Discharge: 2016-05-28 | Disposition: A | Discharge status: Home / Self Care | Payer: Managed Care, Other (non HMO) | Source: Ambulatory Visit | Attending: Internal Medicine | Admitting: Internal Medicine

## 2016-05-28 ENCOUNTER — Encounter: Payer: Self-pay | Admitting: *Deleted

## 2016-05-28 ENCOUNTER — Other Ambulatory Visit (HOSPITAL_BASED_OUTPATIENT_CLINIC_OR_DEPARTMENT_OTHER): Payer: Managed Care, Other (non HMO)

## 2016-05-28 ENCOUNTER — Encounter (HOSPITAL_COMMUNITY): Payer: Self-pay | Admitting: Internal Medicine

## 2016-05-28 VITALS — BP 118/68 | HR 60 | Wt 145.0 lb

## 2016-05-28 VITALS — BP 128/65 | HR 62 | Temp 98.6°F | Resp 16

## 2016-05-28 DIAGNOSIS — C50411 Malignant neoplasm of upper-outer quadrant of right female breast: Secondary | ICD-10-CM | POA: Diagnosis present

## 2016-05-28 DIAGNOSIS — Z5112 Encounter for antineoplastic immunotherapy: Secondary | ICD-10-CM | POA: Diagnosis not present

## 2016-05-28 DIAGNOSIS — Z170 Estrogen receptor positive status [ER+]: Principal | ICD-10-CM

## 2016-05-28 LAB — COMPREHENSIVE METABOLIC PANEL
ALBUMIN: 3.6 g/dL (ref 3.5–5.0)
ALT: 38 U/L (ref 0–55)
ANION GAP: 11 meq/L (ref 3–11)
AST: 25 U/L (ref 5–34)
Alkaline Phosphatase: 96 U/L (ref 40–150)
BILIRUBIN TOTAL: 0.78 mg/dL (ref 0.20–1.20)
BUN: 16.3 mg/dL (ref 7.0–26.0)
CALCIUM: 9.3 mg/dL (ref 8.4–10.4)
CO2: 23 mEq/L (ref 22–29)
CREATININE: 0.8 mg/dL (ref 0.6–1.1)
Chloride: 109 mEq/L (ref 98–109)
EGFR: 82 mL/min/{1.73_m2} — ABNORMAL LOW (ref >90)
Glucose: 92 mg/dl (ref 70–140)
Potassium: 4.1 mEq/L (ref 3.5–5.1)
Sodium: 142 mEq/L (ref 136–145)
TOTAL PROTEIN: 6.7 g/dL (ref 6.4–8.3)

## 2016-05-28 LAB — ECHOCARDIOGRAM COMPLETE
CHL CUP MV DEC (S): 303
E decel time: 303 msec
EERAT: 7.37
FS: 33 % (ref 28–44)
IVS/LV PW RATIO, ED: 0.95
LA diam end sys: 26 mm
LA vol A4C: 36.5 ml
LA vol index: 20.5 mL/m2
LADIAMINDEX: 1.53 cm/m2
LASIZE: 26 mm
LAVOL: 34.8 mL
LDCA: 2.54 cm2
LV E/e'average: 7.37
LV PW d: 8.38 mm — AB (ref 0.6–1.1)
LV e' LATERAL: 12.9 cm/s
LVEEMED: 7.37
LVOT diameter: 18 mm
MV Peak grad: 4 mmHg
MVPKAVEL: 89.2 m/s
MVPKEVEL: 95.1 m/s
RV LATERAL S' VELOCITY: 13.3 cm/s
TAPSE: 25.6 mm
TDI e' lateral: 12.9
TDI e' medial: 9.57

## 2016-05-28 LAB — CBC WITH DIFFERENTIAL/PLATELET
BASO%: 1.1 % (ref 0.0–2.0)
Basophils Absolute: 0.1 10*3/uL (ref 0.0–0.1)
EOS%: 2.9 % (ref 0.0–7.0)
Eosinophils Absolute: 0.1 10*3/uL (ref 0.0–0.5)
HCT: 39.6 % (ref 34.8–46.6)
HEMOGLOBIN: 13.3 g/dL (ref 11.6–15.9)
LYMPH%: 34.9 % (ref 14.0–49.7)
MCH: 30.2 pg (ref 25.1–34.0)
MCHC: 33.7 g/dL (ref 31.5–36.0)
MCV: 89.7 fL (ref 79.5–101.0)
MONO#: 0.3 10*3/uL (ref 0.1–0.9)
MONO%: 6.6 % (ref 0.0–14.0)
NEUT%: 54.5 % (ref 38.4–76.8)
NEUTROS ABS: 2.5 10*3/uL (ref 1.5–6.5)
Platelets: 237 10*3/uL (ref 145–400)
RBC: 4.41 10*6/uL (ref 3.70–5.45)
RDW: 13.7 % (ref 11.2–14.5)
WBC: 4.7 10*3/uL (ref 3.9–10.3)
lymph#: 1.6 10*3/uL (ref 0.9–3.3)

## 2016-05-28 MED ORDER — ACETAMINOPHEN 325 MG PO TABS
ORAL_TABLET | ORAL | Status: AC
  Filled 2016-05-28: qty 2

## 2016-05-28 MED ORDER — SODIUM CHLORIDE 0.9 % IJ SOLN
10.0000 mL | INTRAMUSCULAR | Status: DC | PRN
  Administered 2016-05-28: 10 mL
  Filled 2016-05-28: qty 10

## 2016-05-28 MED ORDER — SODIUM CHLORIDE 0.9 % IV SOLN
6.0000 mg/kg | Freq: Once | INTRAVENOUS | Status: AC
  Administered 2016-05-28: 399 mg via INTRAVENOUS
  Filled 2016-05-28: qty 19

## 2016-05-28 MED ORDER — SODIUM CHLORIDE 0.9 % IV SOLN
Freq: Once | INTRAVENOUS | Status: AC
  Administered 2016-05-28: 10:00:00 via INTRAVENOUS

## 2016-05-28 MED ORDER — HEPARIN SOD (PORK) LOCK FLUSH 100 UNIT/ML IV SOLN
500.0000 [IU] | Freq: Once | INTRAVENOUS | Status: AC | PRN
  Administered 2016-05-28: 500 [IU]
  Filled 2016-05-28: qty 5

## 2016-05-28 MED ORDER — ACETAMINOPHEN 325 MG PO TABS
650.0000 mg | ORAL_TABLET | Freq: Once | ORAL | Status: AC
  Administered 2016-05-28: 650 mg via ORAL

## 2016-05-28 MED ORDER — DIPHENHYDRAMINE HCL 25 MG PO CAPS
25.0000 mg | ORAL_CAPSULE | Freq: Once | ORAL | Status: AC
  Administered 2016-05-28: 25 mg via ORAL

## 2016-05-28 MED ORDER — DIPHENHYDRAMINE HCL 25 MG PO CAPS
ORAL_CAPSULE | ORAL | Status: AC
  Filled 2016-05-28: qty 1

## 2016-05-28 NOTE — Progress Notes (Signed)
  Echocardiogram 2D Echocardiogram has been performed.  Catherine Lawson 05/28/2016, 3:07 PM

## 2016-05-28 NOTE — Progress Notes (Signed)
Patient ID: RAENA PAU, female   DOB: 1956/01/29, 60 y.o.   MRN: [id number]     CARDIO-ONCOLOGY CLINIC NOTE  Referring Physician: Magrinat Primary Care: Theadore Nan  HPI:  [name] is a 60 y.o. woman with no previous significant medical history who was diagnosed with  right breast cancer 02/02/2015 for a clinically multifocal T1c NX, stage IA invasive ductal carcinoma, grade 2 or 3, estrogen and progesterone receptor positive, with HER-2 not amplified and the MIB-1 at 40%. She is referred for enrollment into the cardio-oncology clinic for surveillance while getting Herceptin.   Breast CA history as follows:   (1) biopsy of a suspicious left breast mass 03/28/2015 showed only usual ductal hyperplasia, no malignancy identified.  (2) genetics testing 03/06/2015 through the Breast/Ovarian gene panel offered by [facility] found no deleterious mutations in ATM, BARD1, BRCA1, BRCA2, BRIP1, CDH1, CHEK2, EPCAM, FANCC, MLH1, MSH2, MSH6, NBN, PALB2, PMS2, PTEN, RAD51C, RAD51D, TP53, and XRCC2. (a) there was a Variant of Unknown Significance in the BRCA2 gene called c.[id number]  (3) status post right lumpectomy and sentinel lymph node sampling 05/29/2015 for an mpT1b pN0, stage IA invasive ductal carcinoma, grade 2, with positive margins (a) margins cleared with subsequent surgery 06/12/2015  (4) adjuvant chemotherapy to consist of cyclophosphamide and docetaxel 4, given 3 weeks apart, starting 06/27/2015.  (5) FISH panel returned HER-2 positive. Will begin trastuzumab every 3 weeks through 1 year, starting 59/25/16.  (a) echocardiogram on 07/11/15 showed an EF of 60-65%  (6) adjuvant radiation to follow chemotherapy  (7) received anastrozole 02/08/2015 through December 2016, to resume at the completion of radiation  Here for f/u. Finished XRT in 4/17. Had SVT ablation with Dr. Rayann Heman in 5/17 with two foci ablated. Otherwise tolerating Herceptin well. Off  flecainide and carvedilol. Has 2 more treatments with Herceptin left. Remains very active with cardio-interval course and Body-Pump. No further palpitations   Echo 07/11/15: EF 60-65% LATERAL S' 12.8 gls -19.0 Echo 4/17: EF 55-60%, lateral s' 11.6, GLS -18%, normal RV size and systolic function, mild MR.  Echo 7/17: EF 60-65% lateral s' 9.8 GLS -18.4% normal RV Echo11/14/17: EF 60-65% lateral s' 11.9 GLS -22.9% normal RV  Review of Systems: All systems reviewed and negative except as per HPI.   Past Medical History:  Diagnosis Date  . Arthritis    bil thumb joints  . Breast cancer ([place])   . Breast cancer of upper-outer quadrant of right female breast ([place]) 02/06/2015  . Family history of breast cancer   . Family history of colon cancer   . Family history of ovarian cancer   . Family history of stomach cancer   . SVT (supraventricular tachycardia) (HCC)    fast HR at times due to stress, no chest pain or SOB   Social History   Social History  . Marital status: Married    Spouse name: Event organiser  . Number of children: 0  . Years of education: N/A   Occupational History  . Not on file.   Social History Main Topics  . Smoking status: Never Smoker  . Smokeless tobacco: Not on file  . Alcohol use Yes     Comment: social  . Drug use: No  . Sexual activity: Yes   Other Topics Concern  . Not on file   Social History Narrative   Lives in [place]   Retired   married   Family History  Problem Relation Age of Onset  . Breast cancer Sister 38  .  Colon cancer Father 53  . Cancer Maternal Grandmother     probable ovarian cancer  . Stomach cancer Paternal Grandmother 19  . Brain cancer Other 10    Medulloblastoma  . Parkinson's disease Mother   . Lung cancer Paternal Uncle     smoker  . Stomach cancer Other     PGMs sister  . Stomach cancer Other     PMGs brother   Current Outpatient Prescriptions  Medication Sig Dispense Refill  . anastrozole (ARIMIDEX) 1 MG tablet  Take 1 tablet (1 mg total) by mouth daily. 90 tablet 3  . Bepotastine Besilate 1.5 % SOLN     . cetirizine (ZYRTEC) 10 MG tablet Take 10 mg by mouth daily.    . Crisaborole 2 % OINT Apply topically.    . Crisaborole 2 % OINT Apply topically.    . lidocaine-prilocaine (EMLA) cream Apply 1 application topically daily as needed (Eczema).     . Multiple Vitamin (MULTIVITAMIN) tablet Take 1 tablet by mouth daily.    . Omega-3 Fatty Acids (FISH OIL OMEGA-3 PO) Take 2 tablets by mouth daily.     No current facility-administered medications for this encounter.     No Known Allergies    Social History   Social History  . Marital status: Married    Spouse name: Event organiser  . Number of children: 0  . Years of education: N/A   Occupational History  . Not on file.   Social History Main Topics  . Smoking status: Never Smoker  . Smokeless tobacco: Not on file  . Alcohol use Yes     Comment: social  . Drug use: No  . Sexual activity: Yes   Other Topics Concern  . Not on file   Social History Narrative   Lives in [place]   Retired   married      Family History  Problem Relation Age of Onset  . Breast cancer Sister 68  . Colon cancer Father 9  . Cancer Maternal Grandmother     probable ovarian cancer  . Stomach cancer Paternal Grandmother 63  . Brain cancer Other 10    Medulloblastoma  . Parkinson's disease Mother   . Lung cancer Paternal Uncle     smoker  . Stomach cancer Other     PGMs sister  . Stomach cancer Other     PMGs brother    Vitals:   05/28/16 1458  BP: 118/68  Pulse: 60  SpO2: 98%  Weight: 145 lb (65.8 kg)    PHYSICAL EXAM: General:  Well appearing. No respiratory difficulty HEENT: normal Neck: supple. no JVD. Carotids 2+ bilat; no bruits. No lymphadenopathy or thryomegaly appreciated. Cor: PMI nondisplaced. Regular rate & rhythm. No rubs, gallops or murmurs. Lungs: clear Abdomen: soft, nontender, nondistended. No hepatosplenomegaly. No bruits or  masses. Good bowel sounds. Extremities: no cyanosis, clubbing, rash, edema Neuro: alert & oriented x 3, cranial nerves grossly intact. moves all 4 extremities w/o difficulty. Affect pleasant.   ASSESSMENT & PLAN: 1. Breast cancer:  -I reviewed echos personally. EF and Doppler parameters stable. No HF on exam. Continue Herceptin. She will complete in 12/17. No further echos needed. 2. SVT: s/p ablation with Dr. Rayann Heman 5/17. SVT quiescent.   Houa Nie,MD 05/28/2016

## 2016-05-28 NOTE — Patient Instructions (Signed)
[facility] Discharge Instructions for Patients Receiving Chemotherapy  Today you received the following chemotherapy agents: Herceptin   To help prevent nausea and vomiting after your treatment, we encourage you to take your nausea medication as directed.    If you develop nausea and vomiting that is not controlled by your nausea medication, call the clinic.   BELOW ARE SYMPTOMS THAT SHOULD BE REPORTED IMMEDIATELY:  *FEVER GREATER THAN 100.5 F  *CHILLS WITH OR WITHOUT FEVER  NAUSEA AND VOMITING THAT IS NOT CONTROLLED WITH YOUR NAUSEA MEDICATION  *UNUSUAL SHORTNESS OF BREATH  *UNUSUAL BRUISING OR BLEEDING  TENDERNESS IN MOUTH AND THROAT WITH OR WITHOUT PRESENCE OF ULCERS  *URINARY PROBLEMS  *BOWEL PROBLEMS  UNUSUAL RASH Items with * indicate a potential emergency and should be followed up as soon as possible.  Feel free to call the clinic you have any questions or concerns. The clinic phone number is [phone].  Please show the CHEMO ALERT CARD at check-in to the Emergency Department and triage nurse.   

## 2016-05-28 NOTE — Patient Instructions (Signed)
Follow up as needed

## 2016-06-14 HISTORY — PX: PORT-A-CATH REMOVAL: SHX5289

## 2016-06-18 ENCOUNTER — Other Ambulatory Visit (HOSPITAL_BASED_OUTPATIENT_CLINIC_OR_DEPARTMENT_OTHER): Payer: Managed Care, Other (non HMO)

## 2016-06-18 ENCOUNTER — Ambulatory Visit (HOSPITAL_BASED_OUTPATIENT_CLINIC_OR_DEPARTMENT_OTHER): Payer: Managed Care, Other (non HMO)

## 2016-06-18 ENCOUNTER — Encounter: Payer: Self-pay | Admitting: *Deleted

## 2016-06-18 VITALS — BP 114/67 | HR 68 | Temp 98.7°F | Resp 16

## 2016-06-18 DIAGNOSIS — C50411 Malignant neoplasm of upper-outer quadrant of right female breast: Secondary | ICD-10-CM | POA: Diagnosis not present

## 2016-06-18 DIAGNOSIS — Z5112 Encounter for antineoplastic immunotherapy: Secondary | ICD-10-CM | POA: Diagnosis not present

## 2016-06-18 DIAGNOSIS — Z170 Estrogen receptor positive status [ER+]: Principal | ICD-10-CM

## 2016-06-18 LAB — COMPREHENSIVE METABOLIC PANEL
ALT: 24 U/L (ref 0–55)
ANION GAP: 9 meq/L (ref 3–11)
AST: 23 U/L (ref 5–34)
Albumin: 3.5 g/dL (ref 3.5–5.0)
Alkaline Phosphatase: 80 U/L (ref 40–150)
BILIRUBIN TOTAL: 0.79 mg/dL (ref 0.20–1.20)
BUN: 16.3 mg/dL (ref 7.0–26.0)
CHLORIDE: 108 meq/L (ref 98–109)
CO2: 23 meq/L (ref 22–29)
CREATININE: 0.9 mg/dL (ref 0.6–1.1)
Calcium: 9.3 mg/dL (ref 8.4–10.4)
EGFR: 75 mL/min/{1.73_m2} — ABNORMAL LOW (ref >90)
GLUCOSE: 125 mg/dL (ref 70–140)
Potassium: 4.2 mEq/L (ref 3.5–5.1)
Sodium: 141 mEq/L (ref 136–145)
TOTAL PROTEIN: 6.7 g/dL (ref 6.4–8.3)

## 2016-06-18 LAB — CBC WITH DIFFERENTIAL/PLATELET
BASO%: 1.2 % (ref 0.0–2.0)
BASOS ABS: 0.1 10*3/uL (ref 0.0–0.1)
EOS ABS: 0.2 10*3/uL (ref 0.0–0.5)
EOS%: 3.9 % (ref 0.0–7.0)
HEMATOCRIT: 39.8 % (ref 34.8–46.6)
HEMOGLOBIN: 13.6 g/dL (ref 11.6–15.9)
LYMPH%: 31.7 % (ref 14.0–49.7)
MCH: 30.3 pg (ref 25.1–34.0)
MCHC: 34.2 g/dL (ref 31.5–36.0)
MCV: 88.4 fL (ref 79.5–101.0)
MONO#: 0.3 10*3/uL (ref 0.1–0.9)
MONO%: 7 % (ref 0.0–14.0)
NEUT#: 2.5 10*3/uL (ref 1.5–6.5)
NEUT%: 56.2 % (ref 38.4–76.8)
Platelets: 238 10*3/uL (ref 145–400)
RBC: 4.5 10*6/uL (ref 3.70–5.45)
RDW: 13.1 % (ref 11.2–14.5)
WBC: 4.5 10*3/uL (ref 3.9–10.3)
lymph#: 1.4 10*3/uL (ref 0.9–3.3)

## 2016-06-18 MED ORDER — ACETAMINOPHEN 325 MG PO TABS
650.0000 mg | ORAL_TABLET | Freq: Once | ORAL | Status: AC
  Administered 2016-06-18: 650 mg via ORAL

## 2016-06-18 MED ORDER — SODIUM CHLORIDE 0.9 % IV SOLN
Freq: Once | INTRAVENOUS | Status: AC
  Administered 2016-06-18: 10:00:00 via INTRAVENOUS

## 2016-06-18 MED ORDER — DIPHENHYDRAMINE HCL 25 MG PO CAPS
25.0000 mg | ORAL_CAPSULE | Freq: Once | ORAL | Status: AC
  Administered 2016-06-18: 25 mg via ORAL

## 2016-06-18 MED ORDER — SODIUM CHLORIDE 0.9 % IJ SOLN
10.0000 mL | INTRAMUSCULAR | Status: DC | PRN
  Administered 2016-06-18: 10 mL
  Filled 2016-06-18: qty 10

## 2016-06-18 MED ORDER — HEPARIN SOD (PORK) LOCK FLUSH 100 UNIT/ML IV SOLN
500.0000 [IU] | Freq: Once | INTRAVENOUS | Status: AC | PRN
  Administered 2016-06-18: 500 [IU]
  Filled 2016-06-18: qty 5

## 2016-06-18 MED ORDER — TRASTUZUMAB CHEMO INJECTION 440 MG
6.0000 mg/kg | Freq: Once | INTRAVENOUS | Status: AC
  Administered 2016-06-18: 399 mg via INTRAVENOUS
  Filled 2016-06-18: qty 19

## 2016-06-18 NOTE — Patient Instructions (Signed)
[facility] Discharge Instructions for Patients Receiving Chemotherapy  Today you received the following chemotherapy agents: Herceptin   To help prevent nausea and vomiting after your treatment, we encourage you to take your nausea medication as directed.    If you develop nausea and vomiting that is not controlled by your nausea medication, call the clinic.   BELOW ARE SYMPTOMS THAT SHOULD BE REPORTED IMMEDIATELY:  *FEVER GREATER THAN 100.5 F  *CHILLS WITH OR WITHOUT FEVER  NAUSEA AND VOMITING THAT IS NOT CONTROLLED WITH YOUR NAUSEA MEDICATION  *UNUSUAL SHORTNESS OF BREATH  *UNUSUAL BRUISING OR BLEEDING  TENDERNESS IN MOUTH AND THROAT WITH OR WITHOUT PRESENCE OF ULCERS  *URINARY PROBLEMS  *BOWEL PROBLEMS  UNUSUAL RASH Items with * indicate a potential emergency and should be followed up as soon as possible.  Feel free to call the clinic you have any questions or concerns. The clinic phone number is [phone].  Please show the CHEMO ALERT CARD at check-in to the Emergency Department and triage nurse.   

## 2016-07-03 ENCOUNTER — Telehealth: Payer: Self-pay | Admitting: *Deleted

## 2016-07-03 NOTE — Telephone Encounter (Signed)
Patient called to cancel her flush appt. Patient no longer has a port

## 2016-07-04 ENCOUNTER — Other Ambulatory Visit: Payer: Self-pay | Admitting: Oncology

## 2016-07-05 ENCOUNTER — Other Ambulatory Visit: Payer: Managed Care, Other (non HMO)

## 2016-07-05 ENCOUNTER — Other Ambulatory Visit (HOSPITAL_BASED_OUTPATIENT_CLINIC_OR_DEPARTMENT_OTHER): Payer: Managed Care, Other (non HMO)

## 2016-07-05 ENCOUNTER — Ambulatory Visit: Payer: Managed Care, Other (non HMO)

## 2016-07-05 ENCOUNTER — Ambulatory Visit (HOSPITAL_BASED_OUTPATIENT_CLINIC_OR_DEPARTMENT_OTHER): Payer: Managed Care, Other (non HMO)

## 2016-07-05 ENCOUNTER — Encounter: Payer: Self-pay | Admitting: *Deleted

## 2016-07-05 DIAGNOSIS — Z170 Estrogen receptor positive status [ER+]: Principal | ICD-10-CM

## 2016-07-05 DIAGNOSIS — C50411 Malignant neoplasm of upper-outer quadrant of right female breast: Secondary | ICD-10-CM | POA: Diagnosis not present

## 2016-07-05 DIAGNOSIS — Z5112 Encounter for antineoplastic immunotherapy: Secondary | ICD-10-CM | POA: Diagnosis not present

## 2016-07-05 LAB — CBC WITH DIFFERENTIAL/PLATELET
BASO%: 0.7 % (ref 0.0–2.0)
BASOS ABS: 0 10*3/uL (ref 0.0–0.1)
EOS ABS: 0.2 10*3/uL (ref 0.0–0.5)
EOS%: 3.8 % (ref 0.0–7.0)
HCT: 41.1 % (ref 34.8–46.6)
HEMOGLOBIN: 14.2 g/dL (ref 11.6–15.9)
LYMPH%: 33.2 % (ref 14.0–49.7)
MCH: 30.5 pg (ref 25.1–34.0)
MCHC: 34.5 g/dL (ref 31.5–36.0)
MCV: 88.4 fL (ref 79.5–101.0)
MONO#: 0.4 10*3/uL (ref 0.1–0.9)
MONO%: 6 % (ref 0.0–14.0)
NEUT#: 3.3 10*3/uL (ref 1.5–6.5)
NEUT%: 56.3 % (ref 38.4–76.8)
Platelets: 233 10*3/uL (ref 145–400)
RBC: 4.65 10*6/uL (ref 3.70–5.45)
RDW: 12.9 % (ref 11.2–14.5)
WBC: 5.8 10*3/uL (ref 3.9–10.3)
lymph#: 1.9 10*3/uL (ref 0.9–3.3)
nRBC: 0 % (ref 0–0)

## 2016-07-05 LAB — COMPREHENSIVE METABOLIC PANEL
ALT: 25 U/L (ref 0–55)
AST: 26 U/L (ref 5–34)
Albumin: 3.8 g/dL (ref 3.5–5.0)
Alkaline Phosphatase: 95 U/L (ref 40–150)
Anion Gap: 10 mEq/L (ref 3–11)
BUN: 17.5 mg/dL (ref 7.0–26.0)
CALCIUM: 9.1 mg/dL (ref 8.4–10.4)
CHLORIDE: 108 meq/L (ref 98–109)
CO2: 23 mEq/L (ref 22–29)
Creatinine: 0.9 mg/dL (ref 0.6–1.1)
EGFR: 75 mL/min/{1.73_m2} — ABNORMAL LOW (ref >90)
Glucose: 99 mg/dl (ref 70–140)
POTASSIUM: 4.5 meq/L (ref 3.5–5.1)
SODIUM: 141 meq/L (ref 136–145)
Total Bilirubin: 0.48 mg/dL (ref 0.20–1.20)
Total Protein: 7 g/dL (ref 6.4–8.3)

## 2016-07-05 MED ORDER — ACETAMINOPHEN 325 MG PO TABS: 650.0000 mg | ORAL_TABLET | Freq: Once | ORAL | Status: DC

## 2016-07-05 MED ORDER — DIPHENHYDRAMINE HCL 25 MG PO CAPS: 25.0000 mg | ORAL_CAPSULE | Freq: Once | ORAL | Status: DC

## 2016-07-05 MED ORDER — TRASTUZUMAB CHEMO INJECTION 440 MG
6.0000 mg/kg | Freq: Once | INTRAVENOUS | Status: DC
  Filled 2016-07-05: qty 19

## 2016-07-05 MED ORDER — TRASTUZUMAB CHEMO 150 MG IV SOLR
6.0000 mg/kg | Freq: Once | INTRAVENOUS | Status: AC
  Administered 2016-07-05: 399 mg via INTRAVENOUS
  Filled 2016-07-05: qty 19

## 2016-07-05 MED ORDER — SODIUM CHLORIDE 0.9 % IV SOLN
Freq: Once | INTRAVENOUS | Status: AC
  Administered 2016-07-05: 11:00:00 via INTRAVENOUS

## 2016-07-05 NOTE — Patient Instructions (Signed)
[facility] Discharge Instructions for Patients Receiving Chemotherapy  Today you received the following chemotherapy agents: Herceptin   To help prevent nausea and vomiting after your treatment, we encourage you to take your nausea medication as directed.    If you develop nausea and vomiting that is not controlled by your nausea medication, call the clinic.   BELOW ARE SYMPTOMS THAT SHOULD BE REPORTED IMMEDIATELY:  *FEVER GREATER THAN 100.5 F  *CHILLS WITH OR WITHOUT FEVER  NAUSEA AND VOMITING THAT IS NOT CONTROLLED WITH YOUR NAUSEA MEDICATION  *UNUSUAL SHORTNESS OF BREATH  *UNUSUAL BRUISING OR BLEEDING  TENDERNESS IN MOUTH AND THROAT WITH OR WITHOUT PRESENCE OF ULCERS  *URINARY PROBLEMS  *BOWEL PROBLEMS  UNUSUAL RASH Items with * indicate a potential emergency and should be followed up as soon as possible.  Feel free to call the clinic you have any questions or concerns. The clinic phone number is [phone].  Please show the CHEMO ALERT CARD at check-in to the Emergency Department and triage nurse.   

## 2016-07-30 ENCOUNTER — Ambulatory Visit (HOSPITAL_BASED_OUTPATIENT_CLINIC_OR_DEPARTMENT_OTHER): Payer: Managed Care, Other (non HMO) | Admitting: Oncology

## 2016-07-30 ENCOUNTER — Other Ambulatory Visit (HOSPITAL_BASED_OUTPATIENT_CLINIC_OR_DEPARTMENT_OTHER): Payer: Managed Care, Other (non HMO)

## 2016-07-30 VITALS — BP 145/62 | HR 72 | Temp 98.1°F | Resp 16 | Wt 150.5 lb

## 2016-07-30 DIAGNOSIS — C50411 Malignant neoplasm of upper-outer quadrant of right female breast: Secondary | ICD-10-CM

## 2016-07-30 DIAGNOSIS — Z170 Estrogen receptor positive status [ER+]: Secondary | ICD-10-CM | POA: Diagnosis not present

## 2016-07-30 DIAGNOSIS — Z79811 Long term (current) use of aromatase inhibitors: Secondary | ICD-10-CM | POA: Diagnosis not present

## 2016-07-30 DIAGNOSIS — M8580 Other specified disorders of bone density and structure, unspecified site: Secondary | ICD-10-CM | POA: Diagnosis not present

## 2016-07-30 LAB — CBC WITH DIFFERENTIAL/PLATELET
BASO%: 0.9 % (ref 0.0–2.0)
BASOS ABS: 0.1 10*3/uL (ref 0.0–0.1)
EOS ABS: 0.2 10*3/uL (ref 0.0–0.5)
EOS%: 2.3 % (ref 0.0–7.0)
HEMATOCRIT: 42.4 % (ref 34.8–46.6)
HEMOGLOBIN: 14.7 g/dL (ref 11.6–15.9)
LYMPH%: 22.9 % (ref 14.0–49.7)
MCH: 30.6 pg (ref 25.1–34.0)
MCHC: 34.7 g/dL (ref 31.5–36.0)
MCV: 88.1 fL (ref 79.5–101.0)
MONO#: 0.4 10*3/uL (ref 0.1–0.9)
MONO%: 6.3 % (ref 0.0–14.0)
NEUT%: 67.6 % (ref 38.4–76.8)
NEUTROS ABS: 4.8 10*3/uL (ref 1.5–6.5)
NRBC: 0 % (ref 0–0)
PLATELETS: 252 10*3/uL (ref 145–400)
RBC: 4.81 10*6/uL (ref 3.70–5.45)
RDW: 12.9 % (ref 11.2–14.5)
WBC: 7 10*3/uL (ref 3.9–10.3)
lymph#: 1.6 10*3/uL (ref 0.9–3.3)

## 2016-07-30 LAB — COMPREHENSIVE METABOLIC PANEL
ALT: 36 U/L (ref 0–55)
AST: 31 U/L (ref 5–34)
Albumin: 4 g/dL (ref 3.5–5.0)
Alkaline Phosphatase: 95 U/L (ref 40–150)
Anion Gap: 11 mEq/L (ref 3–11)
BUN: 15.8 mg/dL (ref 7.0–26.0)
CHLORIDE: 106 meq/L (ref 98–109)
CO2: 24 meq/L (ref 22–29)
Calcium: 9.9 mg/dL (ref 8.4–10.4)
Creatinine: 0.9 mg/dL (ref 0.6–1.1)
EGFR: 69 mL/min/{1.73_m2} — ABNORMAL LOW (ref >90)
Glucose: 84 mg/dl (ref 70–140)
POTASSIUM: 4.4 meq/L (ref 3.5–5.1)
Sodium: 140 mEq/L (ref 136–145)
Total Bilirubin: 0.49 mg/dL (ref 0.20–1.20)
Total Protein: 7.4 g/dL (ref 6.4–8.3)

## 2016-07-30 NOTE — Progress Notes (Signed)
[place]  [phone] [phone]) [phone]   ID: Catherine Lawson DOB: 1955/07/20  MR#: [id number]  BWG#:[id number]  Patient Care Team: Cari Caraway, MD as PCP - General (Family Medicine) Excell Seltzer, MD as Consulting Physician (General Surgery) Chauncey Cruel, MD as Consulting Physician (Oncology) Kyung Rudd, MD as Consulting Physician (Radiation Oncology) Mauro Kaufmann, RN as Registered Nurse Rockwell Germany, RN as Registered Nurse Sylvan Cheese, NP as Nurse Practitioner (Nurse Practitioner) Vania Rea, MD as Consulting Physician (Obstetrics and Gynecology) Thompson Grayer, MD as Consulting Physician (Cardiology) PCP: Cari Caraway, MD OTHER MD:  CHIEF COMPLAINT: Estrogen receptor positive breast cancer  CURRENT TREATMENT:  anastrozole  BREAST CANCER HISTORY: From the original intake note:     "Catherine Lawson" had routine screening mammography at [facility] 01/24/2015. This showed a 1.6 cm mass in the right breast at the 11:00 position, and on 0.8 cm irregular density also at the 11:00 position farther away from the nipple. On 02/01/2015 she underwent a unilateral right diagnostic mammography at [facility]. The breast density was category B. In addition to the 2 masses previously noted there was a cluster of calcifications at the 9:00 position. Ultrasonography the same day showed a 1.9 cm oval mass, which was hypoechoic with no vascularity and hard on L a Stogner if he, a 7 mm mass at the 10:00 position with no vascularity and intermediate L a Stogner 3, and a lymph node with uniform cortex thickening in the right axilla. The 2 masses in question were separated by 2 cm.  On 02/02/2015 the patient underwent biopsy of what appears to have been the larger of the 2 breast masses (I cannot locate the biopsy report). This documented invasive ductal carcinoma, grade 2 or 3, with micropapillary features, estrogen and progesterone receptor positive, with an MIB-1 of 40%,  and HER-2 not amplified with a signals ratio of 1.36 and a number per cell of 3.60. There was evidence of lymphovascular invasion.  Because of bleeding from the first biopsy, the second breast mass could not be performed. The lymph node was aspirated, not cord, and this appeared benign but the concordance is questionable.  The patient's subsequent history is as detailed below  INTERVAL HISTORY: Catherine Lawson breast cancer. She received her final trastuzumab dose before the December holidays. She tolerated it well. She had her last echocardiogram November 2017 showing an excellent ejection fraction  She continues on anastrozole. She tolerates that well.   Hot flashes and vaginal dryness are not a major issue. She never developed the arthralgias or myalgias that many patients can experience on this medication. She obtains it at a good price.  REVIEW OF SYSTEMS. she feels "good". Her biggest problem is dry skin. She does have a history of eczema and it particularly bothers her around the eyes and lips. She is using some ointments for this and it is helping a little. She did see an allergist but wonders if she should see a dermatologist. A detailed review of systems today was otherwise stable  PAST MEDICAL HISTORY: Past Medical History:  Diagnosis Date  . Arthritis    bil thumb joints  . Breast cancer ([place])   . Breast cancer of upper-outer quadrant of right female breast ([place]) 02/06/2015  . Family history of breast cancer   . Family history of colon cancer   . Family history of ovarian cancer   . Family history of stomach cancer   . SVT (supraventricular tachycardia) ([facility])    fast HR at  times due to stress, no chest pain or SOB    PAST SURGICAL HISTORY: Past Surgical History:  Procedure Laterality Date  . ELECTROPHYSIOLOGIC STUDY N/A 11/28/2015   left lateral accessory pathway and slow pathway ablation performed by Dr Rayann Heman  . PORTACATH PLACEMENT Right 06/27/2015   Procedure: INSERTION  PORT-A-CATH;  Surgeon: Excell Seltzer, MD;  Location: [facility];  Service: General;  Laterality: Right;  . RADIOACTIVE SEED GUIDED MASTECTOMY WITH AXILLARY SENTINEL LYMPH NODE BIOPSY Right 05/29/2015   Procedure: RADIOACTIVE SEED RIGHT BREAST LUMPECTOMY WITH AXILLARY SENTINEL LYMPH NODE BIOPSY;  Surgeon: Excell Seltzer, MD;  Location: [facility];  Service: General;  Laterality: Right;  . RE-EXCISION OF BREAST LUMPECTOMY Right 06/12/2015   Procedure: RE-EXCISION OF RIGHT  BREAST LUMPECTOMY;  Surgeon: Excell Seltzer, MD;  Location: [facility];  Service: General;  Laterality: Right;  . WISDOM TOOTH EXTRACTION      FAMILY HISTORY Family History  Problem Relation Age of Onset  . Breast cancer Sister 75  . Colon cancer Father 50  . Cancer Maternal Grandmother     probable ovarian cancer  . Stomach cancer Paternal Grandmother 23  . Brain cancer Other 10    Medulloblastoma  . Parkinson's disease Mother   . Lung cancer Paternal Uncle     smoker  . Stomach cancer Other     PGMs sister  . Stomach cancer Other     PMGs brother   the patient's father is still living, at age 18. The patient's mother died from Parkinson's disease complications at age 17. The patient had 5 brothers, 4 sisters. One sister was diagnosed with breast cancer at age 31 and died at age 9. The patient's father was diagnosed with colon cancer at the age of 9. A nephew was diagnosed with medulloblastoma at age 2 and died at age 50. The paternal grandmother was diagnosed with stomach cancer at age 41. The maternal grandmother was diagnosed with metastatic cancer of unknown type at age 67. (The description is suggestive of an ovarian cancer).  GYNECOLOGIC HISTORY:  No LMP recorded. Patient is postmenopausal. Menarche age 79. The patient is GX P0. She stopped having periods in 2014. She did not take hormone replacement. She took oral contraceptives for approximately 20 years with no  complications.  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 Her husband [name] works for a Merchand[employer], retail in [employer] [place] [employer] credited Warehouse manager". [name] has 2 daughters from an earlier marriage, Ronalee Belts lives in [place] and works in recruiting, and Carly Sabo lives in [place] and works in Press photographer. They have 1 grandchild. The patient attends the [place]: [employer]   HEALTH MAINTENANCE: Social History  Substance Use Topics  . Smoking status: Never Smoker  . Smokeless tobacco: Not on file  . Alcohol use Yes     Comment: social     Colonoscopy: 2014/lobe are  PAP: 2015  Bone density: Remote  Lipid panel:  No Known Allergies  Current Outpatient Prescriptions  Medication Sig Dispense Refill  . anastrozole (ARIMIDEX) 1 MG tablet Take 1 tablet (1 mg total) by mouth daily. 90 tablet 3  . Bepotastine Besilate 1.5 % SOLN     . cetirizine (ZYRTEC) 10 MG tablet Take 10 mg by mouth daily.    . Crisaborole 2 % OINT Apply topically.    . Crisaborole 2 % OINT Apply topically.    Marland Kitchen  lidocaine-prilocaine (EMLA) cream Apply 1 application topically daily as needed (Eczema).     . Multiple Vitamin (MULTIVITAMIN) tablet Take 1 tablet by mouth daily.    . Omega-3 Fatty Acids (FISH OIL OMEGA-3 PO) Take 2 tablets by mouth daily.     No current facility-administered medications for this visit.     OBJECTIVE: Middle-aged white woman Who appears well  Vitals:   07/30/16 1200  BP: (!) 145/62  Pulse: 72  Resp: 16  Temp: 98.1 F (36.7 C)     Body mass index is 25.04 kg/m.    ECOG FS:0 - Asymptomatic  Sclerae unicteric, pupils round and equal Oropharynx clear and moist-- no thrush or other lesions No cervical or supraclavicular adenopathy Lungs no rales or rhonchi Heart regular rate and rhythm Abd soft, nontender, positive bowel sounds MSK no focal spinal tenderness, no upper extremity  lymphedema Neuro: nonfocal, well oriented, appropriate affect Breasts: The right breast is status post lumpectomy followed by radiation, with no evidence of local recurrence. Right axilla is benign. The left breast is unremarkable.  LAB RESULTS:  CMP     Component Value Date/Time   NA 141 07/05/2016 0855   K 4.5 07/05/2016 0855   CL 102 11/21/2015 1147   CO2 23 07/05/2016 0855   GLUCOSE 99 07/05/2016 0855   BUN 17.5 07/05/2016 0855   CREATININE 0.9 07/05/2016 0855   CALCIUM 9.1 07/05/2016 0855   PROT 7.0 07/05/2016 0855   ALBUMIN 3.8 07/05/2016 0855   AST 26 07/05/2016 0855   ALT 25 07/05/2016 0855   ALKPHOS 95 07/05/2016 0855   BILITOT 0.48 07/05/2016 0855   GFRNONAA >60 09/08/2015 0226   GFRAA >60 09/08/2015 0226    INo results found for: SPEP, UPEP  Lab Results  Component Value Date   WBC 7.0 07/30/2016   NEUTROABS 4.8 07/30/2016   HGB 14.7 07/30/2016   HCT 42.4 07/30/2016   MCV 88.1 07/30/2016   PLT 252 07/30/2016      Chemistry      Component Value Date/Time   NA 141 07/05/2016 0855   K 4.5 07/05/2016 0855   CL 102 11/21/2015 1147   CO2 23 07/05/2016 0855   BUN 17.5 07/05/2016 0855   CREATININE 0.9 07/05/2016 0855      Component Value Date/Time   CALCIUM 9.1 07/05/2016 0855   ALKPHOS 95 07/05/2016 0855   AST 26 07/05/2016 0855   ALT 25 07/05/2016 0855   BILITOT 0.48 07/05/2016 0855       No results found for: LABCA2  No components found for: LABCA125  No results for input(s): INR in the last 168 hours.  Urinalysis No results found for: COLORURINE, APPEARANCEUR, LABSPEC, PHURINE, GLUCOSEU, HGBUR, BILIRUBINUR, KETONESUR, PROTEINUR, UROBILINOGEN, NITRITE, LEUKOCYTESUR  STUDIES: No results found.  ASSESSMENT: 61 y.o. [place] woman status post right breast upper outer quadrant biopsy 02/02/2015 for a clinically multifocal T1c NX, stage IA invasive ductal carcinoma, grade 2 or 3, estrogen and progesterone receptor positive, with HER-2 not  amplified and the MIB-1 at 40%  (1) biopsy of a suspicious left breast mass 03/28/2015 showed only usual ductal hyperplasia, no malignancy identified.  (2) genetics testing 03/06/2015 through the Breast/Ovarian gene panel offered by [facility] found no deleterious mutations in ATM, BARD1, BRCA1, BRCA2, BRIP1, CDH1, CHEK2, EPCAM, FANCC, MLH1, MSH2, MSH6, NBN, PALB2, PMS2, PTEN, RAD51C, RAD51D, TP53, and XRCC2.  (a) there was a Variant of Unknown Significance in the BRCA2 gene called [id number]  (3) status post right lumpectomy and  sentinel lymph node sampling 05/29/2015 for an mpT1b pN0, stage IA invasive ductal carcinoma, grade 2, with positive margins  (a) margins cleared with subsequent surgery 06/12/2015  (3) Oncotype score of 35 predicts a risk of outside the breast recurrence within 10 years of 24% if the patient's only systemic therapy is tamoxifen for 5 years. It also predicts significant benefit from chemotherapy.  (4) adjuvant chemotherapy consisting of cyclophosphamide and docetaxel 4, given 3 weeks apart, starting 06/27/2015, completed 08/30/2015  (5) FISH panel returned HER-2 positive. Receive trastuzumab starting 08/09/2015, last dose 07/05/2016  (a) echocardiogram 05/28/2016 showed a well-preserved ejection fraction at 60-65%.  (6) adjuvant radiation 09/26/2015 through 11/10/2015: Site/dose:   The patient initially received a dose of 50.4 Gy in 28 fractions to the breast using whole-breast tangent fields. This was delivered using a 3-D conformal technique. The patient then received a boost to the seroma. This delivered an additional 10 Gy in 5 fractions using a electron field. The total dose was 60.4 Gy.  (7) received anastrozole 02/08/2015 through December 2016, resumed at the completion of radiation April 2017  (a) DEXA scan at [facility] 03/28/2015 showed osteopenia, with a T score of -2.1  PLAN: Catherine Lawson is now a little over one year out from definitive surgery for her breast cancer  with no evidence of disease recurrence. This is very favorable.  She tolerates anastrozole well and the plan will be to continue that for a total of 5 years. She will be due for repeat bone scan in the fall of this year.   I don't have a simple solution for her vaginal dryness problems. She has had a lady with a physical therapist who specializes in that issue and she may benefit from further visits. As far as the eczema is concerned I offered her a dermatologic referral but at this point she prefers to wait  Discussing me again in July. She knows to call for any problems that may develop before that visit.  Chauncey Cruel, MD 07/30/16

## 2016-09-10 DIAGNOSIS — Z853 Personal history of malignant neoplasm of breast: Secondary | ICD-10-CM | POA: Diagnosis not present

## 2016-09-10 DIAGNOSIS — N6311 Unspecified lump in the right breast, upper outer quadrant: Secondary | ICD-10-CM | POA: Diagnosis not present

## 2016-09-10 DIAGNOSIS — N6312 Unspecified lump in the right breast, upper inner quadrant: Secondary | ICD-10-CM | POA: Diagnosis not present

## 2016-10-24 DIAGNOSIS — H1045 Other chronic allergic conjunctivitis: Secondary | ICD-10-CM | POA: Diagnosis not present

## 2016-10-24 DIAGNOSIS — J300 Vasomotor rhinitis: Secondary | ICD-10-CM | POA: Diagnosis not present

## 2016-10-24 DIAGNOSIS — L209 Atopic dermatitis, unspecified: Secondary | ICD-10-CM | POA: Diagnosis not present

## 2016-10-31 DIAGNOSIS — L710 Perioral dermatitis: Secondary | ICD-10-CM | POA: Diagnosis not present

## 2016-10-31 DIAGNOSIS — L2089 Other atopic dermatitis: Secondary | ICD-10-CM | POA: Diagnosis not present

## 2016-12-11 ENCOUNTER — Other Ambulatory Visit: Payer: Self-pay | Admitting: *Deleted

## 2016-12-11 MED ORDER — ANASTROZOLE 1 MG PO TABS: 1.0000 mg | ORAL_TABLET | Freq: Every day | ORAL | 3 refills | Status: DC

## 2016-12-16 DIAGNOSIS — M9903 Segmental and somatic dysfunction of lumbar region: Secondary | ICD-10-CM | POA: Diagnosis not present

## 2016-12-16 DIAGNOSIS — M9905 Segmental and somatic dysfunction of pelvic region: Secondary | ICD-10-CM | POA: Diagnosis not present

## 2016-12-16 DIAGNOSIS — M791 Myalgia: Secondary | ICD-10-CM | POA: Diagnosis not present

## 2016-12-16 DIAGNOSIS — M9904 Segmental and somatic dysfunction of sacral region: Secondary | ICD-10-CM | POA: Diagnosis not present

## 2016-12-19 DIAGNOSIS — M9904 Segmental and somatic dysfunction of sacral region: Secondary | ICD-10-CM | POA: Diagnosis not present

## 2016-12-19 DIAGNOSIS — M791 Myalgia: Secondary | ICD-10-CM | POA: Diagnosis not present

## 2016-12-19 DIAGNOSIS — M9903 Segmental and somatic dysfunction of lumbar region: Secondary | ICD-10-CM | POA: Diagnosis not present

## 2016-12-19 DIAGNOSIS — M9905 Segmental and somatic dysfunction of pelvic region: Secondary | ICD-10-CM | POA: Diagnosis not present

## 2016-12-23 DIAGNOSIS — M791 Myalgia: Secondary | ICD-10-CM | POA: Diagnosis not present

## 2016-12-23 DIAGNOSIS — M9905 Segmental and somatic dysfunction of pelvic region: Secondary | ICD-10-CM | POA: Diagnosis not present

## 2016-12-23 DIAGNOSIS — M9904 Segmental and somatic dysfunction of sacral region: Secondary | ICD-10-CM | POA: Diagnosis not present

## 2016-12-23 DIAGNOSIS — M9903 Segmental and somatic dysfunction of lumbar region: Secondary | ICD-10-CM | POA: Diagnosis not present

## 2016-12-30 DIAGNOSIS — M791 Myalgia: Secondary | ICD-10-CM | POA: Diagnosis not present

## 2016-12-30 DIAGNOSIS — M9903 Segmental and somatic dysfunction of lumbar region: Secondary | ICD-10-CM | POA: Diagnosis not present

## 2016-12-30 DIAGNOSIS — M9904 Segmental and somatic dysfunction of sacral region: Secondary | ICD-10-CM | POA: Diagnosis not present

## 2016-12-30 DIAGNOSIS — M9905 Segmental and somatic dysfunction of pelvic region: Secondary | ICD-10-CM | POA: Diagnosis not present

## 2017-01-06 ENCOUNTER — Telehealth: Payer: Self-pay | Admitting: *Deleted

## 2017-01-06 NOTE — Telephone Encounter (Signed)
This RN contacted pt per her VM requesting a return call.  Catherine Lawson states she developed vaginal spotting last week - she used appropriate moisturizers.  Vaginal spotting is very slight - " and I only notice it when I wipe - I do not have any spotting on my underpants'  Spotting has continued and she is wanting further advice.  She is currently on anstrazole and is scheduled for follow up in July.  She denies any urinary concerns.  This note will be given to MD for review.

## 2017-01-07 DIAGNOSIS — R300 Dysuria: Secondary | ICD-10-CM | POA: Diagnosis not present

## 2017-01-08 ENCOUNTER — Telehealth: Payer: Self-pay | Admitting: *Deleted

## 2017-01-08 NOTE — Telephone Encounter (Signed)
This RN spoke with pt per MD review of vaginal spotting while on anastrozole with request for pt to see GYN.  Pt inquired about above " is this just not a side effect of the anastrozole - I read this could happen while on it "  This RN validated pt's concern as well as possible bleeding could be secondary to vaginal tissue atrophy which has not improved since she has been using vaginal moisturizers.  Catherine Lawson verbalized understanding and agreement to the above.

## 2017-01-09 DIAGNOSIS — N950 Postmenopausal bleeding: Secondary | ICD-10-CM | POA: Diagnosis not present

## 2017-01-16 DIAGNOSIS — M9903 Segmental and somatic dysfunction of lumbar region: Secondary | ICD-10-CM | POA: Diagnosis not present

## 2017-01-16 DIAGNOSIS — M791 Myalgia: Secondary | ICD-10-CM | POA: Diagnosis not present

## 2017-01-16 DIAGNOSIS — M9904 Segmental and somatic dysfunction of sacral region: Secondary | ICD-10-CM | POA: Diagnosis not present

## 2017-01-16 DIAGNOSIS — M9905 Segmental and somatic dysfunction of pelvic region: Secondary | ICD-10-CM | POA: Diagnosis not present

## 2017-01-28 ENCOUNTER — Ambulatory Visit (HOSPITAL_BASED_OUTPATIENT_CLINIC_OR_DEPARTMENT_OTHER): Payer: BLUE CROSS/BLUE SHIELD | Admitting: Oncology

## 2017-01-28 ENCOUNTER — Telehealth: Payer: Self-pay | Admitting: Oncology

## 2017-01-28 ENCOUNTER — Other Ambulatory Visit (HOSPITAL_BASED_OUTPATIENT_CLINIC_OR_DEPARTMENT_OTHER): Payer: BLUE CROSS/BLUE SHIELD

## 2017-01-28 VITALS — BP 122/75 | HR 64 | Temp 98.0°F | Resp 20 | Ht 65.0 in | Wt 144.5 lb

## 2017-01-28 DIAGNOSIS — N898 Other specified noninflammatory disorders of vagina: Secondary | ICD-10-CM

## 2017-01-28 DIAGNOSIS — C50411 Malignant neoplasm of upper-outer quadrant of right female breast: Secondary | ICD-10-CM | POA: Diagnosis not present

## 2017-01-28 DIAGNOSIS — Z170 Estrogen receptor positive status [ER+]: Secondary | ICD-10-CM

## 2017-01-28 DIAGNOSIS — Z79811 Long term (current) use of aromatase inhibitors: Secondary | ICD-10-CM

## 2017-01-28 DIAGNOSIS — L309 Dermatitis, unspecified: Secondary | ICD-10-CM | POA: Diagnosis not present

## 2017-01-28 LAB — COMPREHENSIVE METABOLIC PANEL
ALT: 20 U/L (ref 0–55)
AST: 24 U/L (ref 5–34)
Albumin: 3.9 g/dL (ref 3.5–5.0)
Alkaline Phosphatase: 86 U/L (ref 40–150)
Anion Gap: 10 mEq/L (ref 3–11)
BUN: 17.3 mg/dL (ref 7.0–26.0)
CHLORIDE: 106 meq/L (ref 98–109)
CO2: 24 meq/L (ref 22–29)
Calcium: 9.7 mg/dL (ref 8.4–10.4)
Creatinine: 0.8 mg/dL (ref 0.6–1.1)
EGFR: 78 mL/min/{1.73_m2} — ABNORMAL LOW (ref >90)
GLUCOSE: 91 mg/dL (ref 70–140)
POTASSIUM: 5.2 meq/L — AB (ref 3.5–5.1)
SODIUM: 140 meq/L (ref 136–145)
Total Bilirubin: 0.56 mg/dL (ref 0.20–1.20)
Total Protein: 6.9 g/dL (ref 6.4–8.3)

## 2017-01-28 LAB — CBC WITH DIFFERENTIAL/PLATELET
BASO%: 0.8 % (ref 0.0–2.0)
BASOS ABS: 0.1 10*3/uL (ref 0.0–0.1)
EOS ABS: 0.2 10*3/uL (ref 0.0–0.5)
EOS%: 3.2 % (ref 0.0–7.0)
HCT: 41.6 % (ref 34.8–46.6)
HGB: 14 g/dL (ref 11.6–15.9)
LYMPH%: 25 % (ref 14.0–49.7)
MCH: 30.2 pg (ref 25.1–34.0)
MCHC: 33.7 g/dL (ref 31.5–36.0)
MCV: 89.6 fL (ref 79.5–101.0)
MONO#: 0.4 10*3/uL (ref 0.1–0.9)
MONO%: 5.6 % (ref 0.0–14.0)
NEUT%: 65.4 % (ref 38.4–76.8)
NEUTROS ABS: 4.6 10*3/uL (ref 1.5–6.5)
PLATELETS: 260 10*3/uL (ref 145–400)
RBC: 4.65 10*6/uL (ref 3.70–5.45)
RDW: 13.6 % (ref 11.2–14.5)
WBC: 7 10*3/uL (ref 3.9–10.3)
lymph#: 1.7 10*3/uL (ref 0.9–3.3)

## 2017-01-28 NOTE — Progress Notes (Signed)
Catherine Lawson  [phone] [phone]   ID: Catherine Lawson DOB: May 21, 1956  MR#: [id number]  ZHY#:[id number]  Patient Care Team: Cari Caraway, MD as PCP - General (Family Medicine) Excell Seltzer, MD as Consulting Physician (General Surgery) Zacharia Sowles, Virgie Dad, MD as Consulting Physician (Oncology) Kyung Rudd, MD as Consulting Physician (Radiation Oncology) Mauro Kaufmann, RN as Registered Nurse Rockwell Germany, RN as Registered Nurse Jake Shark, Johny Blamer, NP as Nurse Practitioner (Nurse Practitioner) Vania Rea, MD as Consulting Physician (Obstetrics and Gynecology) Thompson Grayer, MD as Consulting Physician (Cardiology) PCP: Cari Caraway, MD OTHER MD:  CHIEF COMPLAINT: Estrogen receptor positive breast cancer  CURRENT TREATMENT:  anastrozole  BREAST CANCER HISTORY: From the original intake note:     "Catherine Lawson" had routine screening mammography at [facility] 01/24/2015. This showed a 1.6 cm mass in the right breast at the 11:00 position, and on 0.8 cm irregular density also at the 11:00 position farther away from the nipple. On 02/01/2015 she underwent a unilateral right diagnostic mammography at [facility]. The breast density was category B. In addition to the 2 masses previously noted there was a cluster of calcifications at the 9:00 position. Ultrasonography the same day showed a 1.9 cm oval mass, which was hypoechoic with no vascularity and hard on L a Stogner if he, a 7 mm mass at the 10:00 position with no vascularity and intermediate L a Stogner 3, and a lymph node with uniform cortex thickening in the right axilla. The 2 masses in question were separated by 2 cm.  On 02/02/2015 the patient underwent biopsy of what appears to have been the larger of the 2 breast masses (I cannot locate the biopsy report). This documented invasive ductal carcinoma, grade 2 or 3, with micropapillary features, estrogen and progesterone receptor positive, with an  MIB-1 of 40%, and HER-2 not amplified with a signals ratio of 1.36 and a number per cell of 3.60. There was evidence of lymphovascular invasion.  Because of bleeding from the first biopsy, the second breast mass could not be performed. The lymph node was aspirated, not cord, and this appeared benign but the concordance is questionable.  The patient's subsequent history is as detailed below  INTERVAL HISTORY: Catherine Lawson returns today for follow-up and treatment of her estrogen receptor positive breast cancer. She continues on anastrozole. Generally she tolerates it well but there are significant problems with vaginal atrophy, which are not significantly relieved with lubricants. In addition she has developed mild eczema. She thinks the anastrozole causes her skin to dry up and this makes the eczema worse. She does obtain the drug at a very good price.    breast cancer. She received her final trastuzumab dose before the December holidays. She tolerated it well. She had her last echocardiogram November 2017 showing an excellent ejection fraction  She continues on anastrozole. She tolerates that well.   Hot flashes and vaginal dryness are not a major issue. She never developed the arthralgias or myalgias that many patients can experience on this medication. She obtains it at a good price.  REVIEW OF SYSTEMS.  A detailed review of systems today was otherwise benign. She exercises regularly, 4-5 times a week.  PAST MEDICAL HISTORY: Past Medical History:  Diagnosis Date  . Arthritis    bil thumb joints  . Breast cancer ([place])   . Breast cancer of upper-outer quadrant of right female breast ([place]) 02/06/2015  . Family history of breast cancer   . Family history of colon  cancer   . Family history of ovarian cancer   . Family history of stomach cancer   . SVT (supraventricular tachycardia) ([facility])    fast HR at times due to stress, no chest pain or SOB    PAST SURGICAL HISTORY: Past Surgical History:    Procedure Laterality Date  . ELECTROPHYSIOLOGIC STUDY N/A 11/28/2015   left lateral accessory pathway and slow pathway ablation performed by Dr Rayann Heman  . PORTACATH PLACEMENT Right 06/27/2015   Procedure: INSERTION PORT-A-CATH;  Surgeon: Excell Seltzer, MD;  Location: [facility];  Service: General;  Laterality: Right;  . RADIOACTIVE SEED GUIDED MASTECTOMY WITH AXILLARY SENTINEL LYMPH NODE BIOPSY Right 05/29/2015   Procedure: RADIOACTIVE SEED RIGHT BREAST LUMPECTOMY WITH AXILLARY SENTINEL LYMPH NODE BIOPSY;  Surgeon: Excell Seltzer, MD;  Location: [facility];  Service: General;  Laterality: Right;  . RE-EXCISION OF BREAST LUMPECTOMY Right 06/12/2015   Procedure: RE-EXCISION OF RIGHT  BREAST LUMPECTOMY;  Surgeon: Excell Seltzer, MD;  Location: [facility];  Service: General;  Laterality: Right;  . WISDOM TOOTH EXTRACTION      FAMILY HISTORY Family History  Problem Relation Age of Onset  . Breast cancer Sister 1  . Colon cancer Father 76  . Cancer Maternal Grandmother        probable ovarian cancer  . Stomach cancer Paternal Grandmother 25  . Brain cancer Other 10       Medulloblastoma  . Parkinson's disease Mother   . Lung cancer Paternal Uncle        smoker  . Stomach cancer Other        PGMs sister  . Stomach cancer Other        PMGs brother   the patient's father is still living, at age 46. The patient's mother died from Parkinson's disease complications at age 29. The patient had 5 brothers, 4 sisters. One sister was diagnosed with breast cancer at age 3 and died at age 73. The patient's father was diagnosed with colon cancer at the age of 57. A nephew was diagnosed with medulloblastoma at age 61 and died at age 31. The paternal grandmother was diagnosed with stomach cancer at age 50. The maternal grandmother was diagnosed with metastatic cancer of unknown type at age 2. (The description is suggestive of an ovarian cancer).  GYNECOLOGIC HISTORY:   No LMP recorded. Patient is postmenopausal. Menarche age 36. The patient is GX P0. She stopped having periods in 2014. She did not take hormone replacement. She took oral contraceptives for approximately 20 years with no complications.  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 Her husband [name] works for a Merchand[employer], retail in [employer] [place] [employer] credited Warehouse manager". [name] has 2 daughters from an earlier marriage, Ronalee Belts lives in [place] and works in recruiting, and Kaylyne Axton lives in [place] and works in Press photographer. They have 1 grandchild. The patient attends the [place]   HEALTH MAINTENANCE: Social History  Substance Use Topics  . Smoking status: Never Smoker  . Smokeless tobacco: Not on file  . Alcohol use Yes     Comment: social     Colonoscopy: 2014/lobe are  PAP: 2015  Bone density: Remote  Lipid panel:  No Known Allergies  Current Outpatient Prescriptions  Medication Sig Dispense Refill  . anastrozole (ARIMIDEX) 1 MG tablet Take 1 tablet (1 mg total) by mouth daily. 90 tablet 3  .  Bepotastine Besilate 1.5 % SOLN     . calcium carbonate (TUMS - DOSED IN MG ELEMENTAL CALCIUM) 500 MG chewable tablet Chew 1 tablet by mouth daily.    . cetirizine (ZYRTEC) 10 MG tablet Take 10 mg by mouth daily.    Marland Kitchen lidocaine-prilocaine (EMLA) cream Apply 1 application topically daily as needed (Eczema).     . Multiple Vitamin (MULTIVITAMIN) tablet Take 1 tablet by mouth daily.    . Omega-3 Fatty Acids (FISH OIL OMEGA-3 PO) Take 2 tablets by mouth daily.     No current facility-administered medications for this visit.     OBJECTIVE: Middle-aged white woman In no acute distress  Vitals:   01/28/17 1324  BP: 122/75  Pulse: 64  Resp: 20  Temp: 98 F (36.7 C)     Body mass index is 24.05 kg/m.    ECOG FS:1 - Symptomatic but completely ambulatory  Sclerae unicteric, EOMs  intact Oropharynx clear and moist No cervical or supraclavicular adenopathy Lungs no rales or rhonchi Heart regular rate and rhythm Abd soft, nontender, positive bowel sounds MSK no focal spinal tenderness, no upper extremity lymphedema Neuro: nonfocal, well oriented, appropriate affect Breasts: The right breast is status post lumpectomy and radiation with no evidence of local recurrence. There is a minimal "blush" over part of the breast which she tells me is the eczema. This is not scaly or palpable. The left breast is unremarkable. Both axillae are benign.  LAB RESULTS:  CMP     Component Value Date/Time   NA 140 07/30/2016 1229   K 4.4 07/30/2016 1229   CL 102 11/21/2015 1147   CO2 24 07/30/2016 1229   GLUCOSE 84 07/30/2016 1229   BUN 15.8 07/30/2016 1229   CREATININE 0.9 07/30/2016 1229   CALCIUM 9.9 07/30/2016 1229   PROT 7.4 07/30/2016 1229   ALBUMIN 4.0 07/30/2016 1229   AST 31 07/30/2016 1229   ALT 36 07/30/2016 1229   ALKPHOS 95 07/30/2016 1229   BILITOT 0.49 07/30/2016 1229   GFRNONAA >60 09/08/2015 0226   GFRAA >60 09/08/2015 0226    INo results found for: SPEP, UPEP  Lab Results  Component Value Date   WBC 7.0 01/28/2017   NEUTROABS 4.6 01/28/2017   HGB 14.0 01/28/2017   HCT 41.6 01/28/2017   MCV 89.6 01/28/2017   PLT 260 01/28/2017      Chemistry      Component Value Date/Time   NA 140 07/30/2016 1229   K 4.4 07/30/2016 1229   CL 102 11/21/2015 1147   CO2 24 07/30/2016 1229   BUN 15.8 07/30/2016 1229   CREATININE 0.9 07/30/2016 1229      Component Value Date/Time   CALCIUM 9.9 07/30/2016 1229   ALKPHOS 95 07/30/2016 1229   AST 31 07/30/2016 1229   ALT 36 07/30/2016 1229   BILITOT 0.49 07/30/2016 1229       No results found for: LABCA2  No components found for: LABCA125  No results for input(s): INR in the last 168 hours.  Urinalysis No results found for: COLORURINE, APPEARANCEUR, LABSPEC, PHURINE, GLUCOSEU, HGBUR, BILIRUBINUR,  KETONESUR, PROTEINUR, UROBILINOGEN, NITRITE, LEUKOCYTESUR  STUDIES: Mammography at [facility] is scheduled for next week.  ASSESSMENT: 61 y.o. [place] woman status post right breast upper outer quadrant biopsy 02/02/2015 for a clinically multifocal T1c NX, stage IA invasive ductal carcinoma, grade 2 or 3, estrogen and progesterone receptor positive, with HER-2 not amplified and the MIB-1 at 40%  (1) biopsy of a suspicious left breast  mass 03/28/2015 showed only usual ductal hyperplasia, no malignancy identified.  (2) genetics testing 03/06/2015 through the Breast/Ovarian gene panel offered by [facility] found no deleterious mutations in ATM, BARD1, BRCA1, BRCA2, BRIP1, CDH1, CHEK2, EPCAM, FANCC, MLH1, MSH2, MSH6, NBN, PALB2, PMS2, PTEN, RAD51C, RAD51D, TP53, and XRCC2.  (a) there was a Variant of Unknown Significance in the BRCA2 gene called [id number]  (3) status post right lumpectomy and sentinel lymph node sampling 05/29/2015 for an mpT1b pN0, stage IA invasive ductal carcinoma, grade 2, with positive margins  (a) margins cleared with subsequent surgery 06/12/2015  (3) Oncotype score of 35 predicts a risk of outside the breast recurrence within 10 years of 24% if the patient's only systemic therapy is tamoxifen for 5 years. It also predicts significant benefit from chemotherapy.  (4) adjuvant chemotherapy consisting of cyclophosphamide and docetaxel 4, given 3 weeks apart, starting 06/27/2015, completed 08/30/2015  (5) FISH panel returned HER-2 positive. Receive trastuzumab starting 08/09/2015, last dose 07/05/2016  (a) echocardiogram 05/28/2016 showed a well-preserved ejection fraction at 60-65%.  (6) adjuvant radiation 09/26/2015 through 11/10/2015: Site/dose:   The patient initially received a dose of 50.4 Gy in 28 fractions to the breast using whole-breast tangent fields. This was delivered using a 3-D conformal technique. The patient then received a boost to the seroma. This delivered an  additional 10 Gy in 5 fractions using a electron field. The total dose was 60.4 Gy.  (7) received anastrozole 02/08/2015 through December 2016, resumed at the completion of radiation April 2017  (a) DEXA scan at [facility] 03/28/2015 showed osteopenia, with a T score of -2.1  PLAN: Catherine Lawson will soon be 2 years out from definitive surgery for her breast cancer with no evidence of disease recurrence. This is very favorable.  She is having some problems from the anastrozole. She feels this makes her skin dry and that worsens the eczema problem. There are also significant vaginal dryness issues.  Accordingly today we spent approximately 30 minutes discussing switching to tamoxifen. She understands tamoxifen works differently, does not "take away her estrogen", therefore should not make her skin dry or worsen problems with vaginal atrophy. In fact it would be possible for her to use vaginal estrogens while on tamoxifen.  We did discuss the possible toxicities, side effects and complications including concerns regarding blood clots, endometrial hyperplasia, endometrial polyps, and the rare cases of endometrial cancer.  After much discussion what we decided to do is for her to stop anastrozole now and call me in 6-8 weeks. If she thinks stopping the anastrozole has helped and she is willing to try tamoxifen, that would be my recommendation.  I'm going to see her in mid November and from that point likely will start seeing her in August or September, after her yearly mammogram.  She has a good understanding of the overall plan. She will call with any problems that may develop before the next visit. Chauncey Cruel, MD 01/28/17

## 2017-01-28 NOTE — Telephone Encounter (Signed)
Pt stated MD told her to return in 6 months, although LOs stated mid Nov. Gave pt 6 month appt per request

## 2017-02-11 DIAGNOSIS — Z853 Personal history of malignant neoplasm of breast: Secondary | ICD-10-CM | POA: Diagnosis not present

## 2017-02-11 DIAGNOSIS — R928 Other abnormal and inconclusive findings on diagnostic imaging of breast: Secondary | ICD-10-CM | POA: Diagnosis not present

## 2017-03-25 DIAGNOSIS — Z01419 Encounter for gynecological examination (general) (routine) without abnormal findings: Secondary | ICD-10-CM | POA: Diagnosis not present

## 2017-03-25 DIAGNOSIS — Z6824 Body mass index (BMI) 24.0-24.9, adult: Secondary | ICD-10-CM | POA: Diagnosis not present

## 2017-06-16 NOTE — Progress Notes (Signed)
Catherine Lawson received her flu shot at the [facility] on 06/02/17 to LT deltoid Lot # [id number] NDC: [id number] Mfg: [id number] Expires: 01/11/18

## 2017-06-21 ENCOUNTER — Other Ambulatory Visit: Payer: Self-pay | Admitting: Nurse Practitioner

## 2017-07-30 NOTE — Progress Notes (Signed)
[name]  [phone] [phone]   ID: TRANISE FORREST DOB: 1955-12-15  MR#: [id number]  QTM#:[id number]  Patient Care Team: Cari Caraway, MD as PCP - General (Family Medicine) Excell Seltzer, MD as Consulting Physician (General Surgery) Anett Ranker, Virgie Dad, MD as Consulting Physician (Oncology) Kyung Rudd, MD as Consulting Physician (Radiation Oncology) Mauro Kaufmann, RN as Registered Nurse Rockwell Germany, RN as Registered Nurse Jake Shark, Johny Blamer, NP as Nurse Practitioner (Nurse Practitioner) Vania Rea, MD as Consulting Physician (Obstetrics and Gynecology) Thompson Grayer, MD as Consulting Physician (Cardiology) PCP: Cari Caraway, MD OTHER MD:  CHIEF COMPLAINT: Estrogen receptor positive breast cancer  CURRENT TREATMENT:  anastrozole  BREAST CANCER HISTORY: From the original intake note:     "Catherine Lawson" had routine screening mammography at [facility] 01/24/2015. This showed a 1.6 cm mass in the right breast at the 11:00 position, and on 0.8 cm irregular density also at the 11:00 position farther away from the nipple. On 02/01/2015 she underwent a unilateral right diagnostic mammography at [facility]. The breast density was category B. In addition to the 2 masses previously noted there was a cluster of calcifications at the 9:00 position. Ultrasonography the same day showed a 1.9 cm oval mass, which was hypoechoic with no vascularity and hard on L a Stogner if he, a 7 mm mass at the 10:00 position with no vascularity and intermediate L a Stogner 3, and a lymph node with uniform cortex thickening in the right axilla. The 2 masses in question were separated by 2 cm.  On 02/02/2015 the patient underwent biopsy of what appears to have been the larger of the 2 breast masses (I cannot locate the biopsy report). This documented invasive ductal carcinoma, grade 2 or 3, with micropapillary features, estrogen and progesterone receptor positive, with an  MIB-1 of 40%, and HER-2 not amplified with a signals ratio of 1.36 and a number per cell of 3.60. There was evidence of lymphovascular invasion.  Because of bleeding from the first biopsy, the second breast mass could not be performed. The lymph node was aspirated, not cord, and this appeared benign but the concordance is questionable.  The patient's subsequent history is as detailed below  INTERVAL HISTORY: Catherine Lawson returns today for follow-up and treatment of her estrogen receptor positive breast cancer. She continues on anastrozole, generally with good tolerance. She notes that hot flashes aren't a major issue for her. She notes that vaginal dryness continues to be an issue for her despite completing pelvic health classes. She notes that she may want to switch to tamoxifen after she completes two years of anastrozole in June 2019.   Since her last visit, she underwent diagnostic bilateral mammography with CAD and tomography on 02/11/2017 at [facility] showing: breast density category B. There was no evidence of malignancy.    REVIEW OF SYSTEMS.  Estie reports that she continues to care for her dry skin and eczema. She notes that she started having issues with rosacea. She follows up with her allergist and dermatologist. She notes that she was prescribed medications to aid her skin issues. She notes that she had a cold for that last 3 weeks. She notes that she hasn't worked out in a month. She reports that she is allowing her body to heal. She notes that she has put on weight since the holidays, but she isn't stressed about it because she is looking forward to exercising again. She denies unusual headaches, visual changes, nausea, vomiting, or dizziness. There has  been no unusual cough, phlegm production, or pleurisy. This been no change in bowel or bladder habits. She denies unexplained fatigue or unexplained weight loss, bleeding, rash, or fever. A detailed review of systems was otherwise stable.     PAST MEDICAL HISTORY: Past Medical History:  Diagnosis Date  . Arthritis    bil thumb joints  . Breast cancer ([place])   . Breast cancer of upper-outer quadrant of right female breast ([place]) 02/06/2015  . Family history of breast cancer   . Family history of colon cancer   . Family history of ovarian cancer   . Family history of stomach cancer   . SVT (supraventricular tachycardia) ([facility])    fast HR at times due to stress, no chest pain or SOB    PAST SURGICAL HISTORY: Past Surgical History:  Procedure Laterality Date  . ELECTROPHYSIOLOGIC STUDY N/A 11/28/2015   left lateral accessory pathway and slow pathway ablation performed by Dr Rayann Heman  . PORTACATH PLACEMENT Right 06/27/2015   Procedure: INSERTION PORT-A-CATH;  Surgeon: Excell Seltzer, MD;  Location: [facility];  Service: General;  Laterality: Right;  . RADIOACTIVE SEED GUIDED PARTIAL MASTECTOMY WITH AXILLARY SENTINEL LYMPH NODE BIOPSY Right 05/29/2015   Procedure: RADIOACTIVE SEED RIGHT BREAST LUMPECTOMY WITH AXILLARY SENTINEL LYMPH NODE BIOPSY;  Surgeon: Excell Seltzer, MD;  Location: [facility];  Service: General;  Laterality: Right;  . RE-EXCISION OF BREAST LUMPECTOMY Right 06/12/2015   Procedure: RE-EXCISION OF RIGHT  BREAST LUMPECTOMY;  Surgeon: Excell Seltzer, MD;  Location: [facility];  Service: General;  Laterality: Right;  . WISDOM TOOTH EXTRACTION      FAMILY HISTORY Family History  Problem Relation Age of Onset  . Breast cancer Sister 28  . Colon cancer Father 24  . Cancer Maternal Grandmother        probable ovarian cancer  . Stomach cancer Paternal Grandmother 31  . Brain cancer Other 10       Medulloblastoma  . Parkinson's disease Mother   . Lung cancer Paternal Uncle        smoker  . Stomach cancer Other        PGMs sister  . Stomach cancer Other        PMGs brother   the patient's father is still living, at age 2. The patient's mother died from Parkinson's disease  complications at age 62. The patient had 5 brothers, 4 sisters. One sister was diagnosed with breast cancer at age 65 and died at age 52. The patient's father was diagnosed with colon cancer at the age of 67. A nephew was diagnosed with medulloblastoma at age 81 and died at age 19. The paternal grandmother was diagnosed with stomach cancer at age 29. The maternal grandmother was diagnosed with metastatic cancer of unknown type at age 41. (The description is suggestive of an ovarian cancer).  GYNECOLOGIC HISTORY:  No LMP recorded. Patient is postmenopausal. Menarche age 14. The patient is GX P0. She stopped having periods in 2014. She did not take hormone replacement. She took oral contraceptives for approximately 20 years with no complications.  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 Her husband [name] works for a Merchand[employer], retail in [employer] [employer] credited Warehouse manager". [name] has 2 daughters from an earlier marriage, Ronalee Belts lives in [place] and works in recruiting, and Tela Kotecki lives in [place] and works in Press photographer. They have 1 grandchild. The patient attends the [employer]  church    ADVANCED DIRECTIVES: In place   HEALTH MAINTENANCE: Social History   Tobacco Use  . Smoking status: Never Smoker  Substance Use Topics  . Alcohol use: Yes    Comment: social  . Drug use: No     Colonoscopy: 2014/lobe are  PAP: 2015  Bone density: Remote  Lipid panel:  No Known Allergies  Current Outpatient Medications  Medication Sig Dispense Refill  . anastrozole (ARIMIDEX) 1 MG tablet Take 1 tablet (1 mg total) by mouth daily. 90 tablet 3  . calcium carbonate (TUMS - DOSED IN MG ELEMENTAL CALCIUM) 500 MG chewable tablet Chew 1 tablet by mouth daily.    . cetirizine (ZYRTEC) 10 MG tablet Take 10 mg by mouth daily.    . Crisaborole 2 % OINT Apply topically.    . fluocinolone (VANOS) 0.01 % cream Apply topically 2 (two) times  daily.    . Multiple Vitamin (MULTIVITAMIN) tablet Take 1 tablet by mouth daily.    . Omega-3 Fatty Acids (FISH OIL OMEGA-3 PO) Take 2 tablets by mouth daily.     No current facility-administered medications for this visit.     OBJECTIVE: Middle-aged white woman in no acute distress  Vitals:   07/31/17 1354  BP: (!) 144/59  Pulse: 88  Resp: 18  Temp: 99.2 F (37.3 C)  SpO2: 99%     Body mass index is 24.98 kg/m.    ECOG FS:1 - Symptomatic but completely ambulatory  Sclerae unicteric, pupils round and equal Oropharynx clear and moist No cervical or supraclavicular adenopathy Lungs no rales or rhonchi Heart regular rate and rhythm Abd soft, nontender, positive bowel sounds MSK no focal spinal tenderness, no upper extremity lymphedema Neuro: nonfocal, well oriented, appropriate affect Breasts: The right breast is status post lumpectomy followed by radiation.  The cosmetic result is good, with minimal distortion of the breast contour.  There is no evidence of local recurrence.  Left breast is unremarkable.  Both axillae are benign.  LAB RESULTS:  CMP     Component Value Date/Time   NA 140 01/28/2017 1312   K 5.2 (H) 01/28/2017 1312   CL 102 11/21/2015 1147   CO2 24 01/28/2017 1312   GLUCOSE 91 01/28/2017 1312   BUN 17.3 01/28/2017 1312   CREATININE 0.8 01/28/2017 1312   CALCIUM 9.7 01/28/2017 1312   PROT 6.9 01/28/2017 1312   ALBUMIN 3.9 01/28/2017 1312   AST 24 01/28/2017 1312   ALT 20 01/28/2017 1312   ALKPHOS 86 01/28/2017 1312   BILITOT 0.56 01/28/2017 1312   GFRNONAA >60 09/08/2015 0226   GFRAA >60 09/08/2015 0226    INo results found for: SPEP, UPEP  Lab Results  Component Value Date   WBC 7.0 01/28/2017   NEUTROABS 4.6 01/28/2017   HGB 14.0 01/28/2017   HCT 41.6 01/28/2017   MCV 89.6 01/28/2017   PLT 260 01/28/2017      Chemistry      Component Value Date/Time   NA 140 01/28/2017 1312   K 5.2 (H) 01/28/2017 1312   CL 102 11/21/2015 1147   CO2  24 01/28/2017 1312   BUN 17.3 01/28/2017 1312   CREATININE 0.8 01/28/2017 1312      Component Value Date/Time   CALCIUM 9.7 01/28/2017 1312   ALKPHOS 86 01/28/2017 1312   AST 24 01/28/2017 1312   ALT 20 01/28/2017 1312   BILITOT 0.56 01/28/2017 1312       No results found for: LABCA2  No  components found for: [id number]  No results for input(s): INR in the last 168 hours.  Urinalysis No results found for: COLORURINE, APPEARANCEUR, LABSPEC, PHURINE, GLUCOSEU, HGBUR, BILIRUBINUR, KETONESUR, PROTEINUR, UROBILINOGEN, NITRITE, LEUKOCYTESUR  STUDIES: Since her last visit, she underwent diagnostic bilateral mammography with CAD and tomography on 02/11/2017 at [facility] showing: breast density category B. There was no evidence of malignancy.    ASSESSMENT: 62 y.o. [place] woman status post right breast upper outer quadrant biopsy 02/02/2015 for a clinically multifocal T1c NX, stage IA invasive ductal carcinoma, grade 2 or 3, estrogen and progesterone receptor positive, with HER-2 not amplified and the MIB-1 at 40%  (1) biopsy of a suspicious left breast mass 03/28/2015 showed only usual ductal hyperplasia, no malignancy identified.  (2) genetics testing 03/06/2015 through the Breast/Ovarian gene panel offered by [facility] found no deleterious mutations in ATM, BARD1, BRCA1, BRCA2, BRIP1, CDH1, CHEK2, EPCAM, FANCC, MLH1, MSH2, MSH6, NBN, PALB2, PMS2, PTEN, RAD51C, RAD51D, TP53, and XRCC2.  (a) there was a Variant of Unknown Significance in the BRCA2 gene called [id number]  (3) status post right lumpectomy and sentinel lymph node sampling 05/29/2015 for an mpT1b pN0, stage IA invasive ductal carcinoma, grade 2, with positive margins  (a) margins cleared with subsequent surgery 06/12/2015  (3) Oncotype score of 35 predicts a risk of outside the breast recurrence within 10 years of 24% if the patient's only systemic therapy is tamoxifen for 5 years. It also predicts significant benefit from  chemotherapy.  (4) adjuvant chemotherapy consisting of cyclophosphamide and docetaxel 4, given 3 weeks apart, starting 06/27/2015, completed 08/30/2015  (5) FISH panel returned HER-2 positive. Receive trastuzumab starting 08/09/2015, last dose 07/05/2016  (a) echocardiogram 05/28/2016 showed a well-preserved ejection fraction at 60-65%.  (6) adjuvant radiation 09/26/2015 through 11/10/2015: Site/dose:   The patient initially received a dose of 50.4 Gy in 28 fractions to the breast using whole-breast tangent fields. This was delivered using a 3-D conformal technique. The patient then received a boost to the seroma. This delivered an additional 10 Gy in 5 fractions using a electron field. The total dose was 60.4 Gy.  (7) received anastrozole 02/08/2015 through December 2016, resumed at the completion of radiation April 2017  (a) D[facility]A scan at [facility] 03/28/2015 showed osteopenia, with a T score of -2.1  (b) repeat bone density pending August 2019  (c) to stop anastrozole 01/11/2018  (8) to start tamoxifen August 2019  PLAN: Catherine Lawson is now just over 2 years out from definitive surgery for her breast cancer with no evidence of disease recurrence.  This is very favorable.  She is struggling to continue the anastrozole, but she is very motivated.  We are going to stop it on 01/11/2018.  After that she will have a month or so off and then start tamoxifen.  That will make an enormous difference to her in terms of vaginal dryness symptoms, which is the main concern here.  She was very appreciative of the care she has received through our team and I was delighted to pass her complements on to other members of the group.  She will see me again mid August, after her next mammogram and I have added a repeat DEXA scan to this year studies.  She knows to call for any issues that may develop before the next visit.   Samel Bruna, Virgie Dad, MD  07/31/17 2:31 PM Medical Oncology and Hematology [facility] [address] [place], [place] [address] Tel. [phone]    Fax. [phone]  This document serves as a record of services personally performed by Lurline Del, MD. It was created on his behalf by Sheron Nightingale, a trained medical scribe. The creation of this record is based on the scribe's personal observations and the provider's statements to them.   I have reviewed the above documentation for accuracy and completeness, and I agree with the above.

## 2017-07-31 ENCOUNTER — Telehealth: Payer: Self-pay | Admitting: Oncology

## 2017-07-31 ENCOUNTER — Inpatient Hospital Stay: Payer: BLUE CROSS/BLUE SHIELD | Attending: Oncology | Admitting: Oncology

## 2017-07-31 VITALS — BP 144/59 | HR 88 | Temp 99.2°F | Resp 18 | Ht 65.0 in | Wt 150.1 lb

## 2017-07-31 DIAGNOSIS — Z170 Estrogen receptor positive status [ER+]: Secondary | ICD-10-CM

## 2017-07-31 DIAGNOSIS — C50411 Malignant neoplasm of upper-outer quadrant of right female breast: Secondary | ICD-10-CM

## 2017-07-31 DIAGNOSIS — Z923 Personal history of irradiation: Secondary | ICD-10-CM

## 2017-07-31 DIAGNOSIS — Z9221 Personal history of antineoplastic chemotherapy: Secondary | ICD-10-CM | POA: Diagnosis not present

## 2017-07-31 DIAGNOSIS — Z79811 Long term (current) use of aromatase inhibitors: Secondary | ICD-10-CM | POA: Diagnosis not present

## 2017-07-31 DIAGNOSIS — I471 Supraventricular tachycardia: Secondary | ICD-10-CM

## 2017-07-31 DIAGNOSIS — M8580 Other specified disorders of bone density and structure, unspecified site: Secondary | ICD-10-CM

## 2017-07-31 MED ORDER — ANASTROZOLE 1 MG PO TABS: 1.0000 mg | ORAL_TABLET | Freq: Every day | ORAL | 3 refills | Status: DC

## 2017-07-31 NOTE — Telephone Encounter (Signed)
Scheduled appts per 1/17 los - patient did not want avs or calendar.

## 2017-10-23 DIAGNOSIS — Z23 Encounter for immunization: Secondary | ICD-10-CM | POA: Diagnosis not present

## 2017-10-23 DIAGNOSIS — J300 Vasomotor rhinitis: Secondary | ICD-10-CM | POA: Diagnosis not present

## 2017-10-23 DIAGNOSIS — H1045 Other chronic allergic conjunctivitis: Secondary | ICD-10-CM | POA: Diagnosis not present

## 2017-10-23 DIAGNOSIS — L209 Atopic dermatitis, unspecified: Secondary | ICD-10-CM | POA: Diagnosis not present

## 2017-10-23 DIAGNOSIS — Z0000 Encounter for general adult medical examination without abnormal findings: Secondary | ICD-10-CM | POA: Diagnosis not present

## 2017-10-28 DIAGNOSIS — Z13220 Encounter for screening for lipoid disorders: Secondary | ICD-10-CM | POA: Diagnosis not present

## 2017-10-28 DIAGNOSIS — R739 Hyperglycemia, unspecified: Secondary | ICD-10-CM | POA: Diagnosis not present

## 2017-10-28 DIAGNOSIS — Z0000 Encounter for general adult medical examination without abnormal findings: Secondary | ICD-10-CM | POA: Diagnosis not present

## 2018-02-12 DIAGNOSIS — R928 Other abnormal and inconclusive findings on diagnostic imaging of breast: Secondary | ICD-10-CM | POA: Diagnosis not present

## 2018-02-12 DIAGNOSIS — M85832 Other specified disorders of bone density and structure, left forearm: Secondary | ICD-10-CM | POA: Diagnosis not present

## 2018-02-12 DIAGNOSIS — Z853 Personal history of malignant neoplasm of breast: Secondary | ICD-10-CM | POA: Diagnosis not present

## 2018-02-23 ENCOUNTER — Telehealth: Payer: Self-pay | Admitting: Gastroenterology

## 2018-02-23 NOTE — Telephone Encounter (Signed)
Hi Dr. Fuller Plan, we have received pt's previous GI records. She is due for a colon and is requesting you to review her records because her husband is a patient of yours. Records will be placed on your desk for review. Thank you.

## 2018-02-26 ENCOUNTER — Encounter: Payer: Self-pay | Admitting: Gastroenterology

## 2018-03-02 NOTE — Progress Notes (Signed)
Catherine Lawson  [phone] [phone]   ID: Catherine Lawson DOB: 13-Mar-1956  MR#: [id number]  XEN#:[id number]  Patient Care Team: Cari Caraway, MD as PCP - General (Family Medicine) Excell Seltzer, MD as Consulting Physician (General Surgery) Perla Echavarria, Virgie Dad, MD as Consulting Physician (Oncology) Kyung Rudd, MD as Consulting Physician (Radiation Oncology) Mauro Kaufmann, RN as Registered Nurse Rockwell Germany, RN as Registered Nurse Jake Shark, Johny Blamer, NP as Nurse Practitioner (Nurse Practitioner) Vania Rea, MD as Consulting Physician (Obstetrics and Gynecology) Thompson Grayer, MD as Consulting Physician (Cardiology) PCP: Cari Caraway, MD OTHER MD:  CHIEF COMPLAINT: Estrogen receptor positive breast cancer  CURRENT TREATMENT:  anastrozole  BREAST CANCER HISTORY: From the original intake note:     "Catherine Lawson" had routine screening mammography at [facility] 01/24/2015. This showed a 1.6 cm mass in the right breast at the 11:00 position, and on 0.8 cm irregular density also at the 11:00 position farther away from the nipple. On 02/01/2015 she underwent a unilateral right diagnostic mammography at [facility]. The breast density was category B. In addition to the 2 masses previously noted there was a cluster of calcifications at the 9:00 position. Ultrasonography the same day showed a 1.9 cm oval mass, which was hypoechoic with no vascularity and hard on L a Stogner if he, a 7 mm mass at the 10:00 position with no vascularity and intermediate L a Stogner 3, and a lymph node with uniform cortex thickening in the right axilla. The 2 masses in question were separated by 2 cm.  On 02/02/2015 the patient underwent biopsy of what appears to have been the larger of the 2 breast masses (I cannot locate the biopsy report). This documented invasive ductal carcinoma, grade 2 or 3, with micropapillary features, estrogen and progesterone receptor positive, with an  MIB-1 of 40%, and HER-2 not amplified with a signals ratio of 1.36 and a number per cell of 3.60. There was evidence of lymphovascular invasion.  Because of bleeding from the first biopsy, the second breast mass could not be performed. The lymph node was aspirated, not cord, and this appeared benign but the concordance is questionable.  The patient's subsequent history is as detailed below  INTERVAL HISTORY: Catherine Lawson returns today for follow-up and treatment of her estrogen receptor positive breast cancer. She continues on anastrozole. She discontinued taking this at the end of June 2019. She denies having issues with hot flashes. She does have some vaginal dryness.   Since her last visit, she underwent diagnostic bilateral mammography with CAD and tomography on 02/12/2018 at [facility] showing: breast density category B. There was no evidence of malignancy.   She also completed a bone density on 02/12/2018 at [facility] which showed a T-score of -2.1 osteopenia    REVIEW OF SYSTEMS.  Catherine Lawson reports that she watches her 2 grandsons one day per week. She is taking care of her home, husband , and family. She also became an advocate for other women battling breast cancer all over the country, and she gives encouragement to others with her experiences. She has skin dryness and eczema. She denies unusual headaches, visual changes, nausea, vomiting, or dizziness. There has been no unusual cough, phlegm production, or pleurisy. There has been no change in bowel or bladder habits. She denies unexplained fatigue or unexplained weight loss, bleeding, rash, or fever. A detailed review of systems was otherwise stable.    PAST MEDICAL HISTORY: Past Medical History:  Diagnosis Date  . Arthritis  bil thumb joints  . Breast cancer ([place])   . Breast cancer of upper-outer quadrant of right female breast ([place]) 02/06/2015  . Family history of breast cancer   . Family history of colon cancer   . Family history of  ovarian cancer   . Family history of stomach cancer   . SVT (supraventricular tachycardia) ([facility])    fast HR at times due to stress, no chest pain or SOB    PAST SURGICAL HISTORY: Past Surgical History:  Procedure Laterality Date  . ELECTROPHYSIOLOGIC STUDY N/A 11/28/2015   left lateral accessory pathway and slow pathway ablation performed by Dr Rayann Heman  . PORTACATH PLACEMENT Right 06/27/2015   Procedure: INSERTION PORT-A-CATH;  Surgeon: Excell Seltzer, MD;  Location: [facility];  Service: General;  Laterality: Right;  . RADIOACTIVE SEED GUIDED PARTIAL MASTECTOMY WITH AXILLARY SENTINEL LYMPH NODE BIOPSY Right 05/29/2015   Procedure: RADIOACTIVE SEED RIGHT BREAST LUMPECTOMY WITH AXILLARY SENTINEL LYMPH NODE BIOPSY;  Surgeon: Excell Seltzer, MD;  Location: [facility];  Service: General;  Laterality: Right;  . RE-EXCISION OF BREAST LUMPECTOMY Right 06/12/2015   Procedure: RE-EXCISION OF RIGHT  BREAST LUMPECTOMY;  Surgeon: Excell Seltzer, MD;  Location: [facility];  Service: General;  Laterality: Right;  . WISDOM TOOTH EXTRACTION      FAMILY HISTORY Family History  Problem Relation Age of Onset  . Breast cancer Sister 3  . Colon cancer Father 48  . Cancer Maternal Grandmother        probable ovarian cancer  . Stomach cancer Paternal Grandmother 90  . Brain cancer Other 10       Medulloblastoma  . Parkinson's disease Mother   . Lung cancer Paternal Uncle        smoker  . Stomach cancer Other        PGMs sister  . Stomach cancer Other        PMGs brother   the patient's father is still living, at age 23. The patient's mother died from Parkinson's disease complications at age 24. The patient had 5 brothers, 4 sisters. One sister was diagnosed with breast cancer at age 67 and died at age 3. The patient's father was diagnosed with colon cancer at the age of 41. A nephew was diagnosed with medulloblastoma at age 24 and died at age 13. The paternal  grandmother was diagnosed with stomach cancer at age 53. The maternal grandmother was diagnosed with metastatic cancer of unknown type at age 61. (The description is suggestive of an ovarian cancer).  GYNECOLOGIC HISTORY:  No LMP recorded. Patient is postmenopausal. Menarche age 3. The patient is GX P0. She stopped having periods in 2014. She did not take hormone replacement. She took oral contraceptives for approximately 20 years with no complications.  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 Her husband [name] works for a Merchandiser, retail in [employer] "[place] [employer] credited Warehouse manager". [name] has 2 daughters from an earlier marriage, Ronalee Belts lives in [place] and works in recruiting, and Roschelle Calandra lives in [place] and works in Press photographer. They have 1 grandchild. The patient attends the [employer]   HEALTH MAINTENANCE: Social History   Tobacco Use  . Smoking status: Never Smoker  Substance Use Topics  . Alcohol use: Yes    Comment: social  . Drug use: No     Colonoscopy: 2014/lobe are  PAP: 2015  Bone  density: 02/12/2018 showed a T score of -2.1  Lipid panel:  No Known Allergies  Current Outpatient Medications  Medication Sig Dispense Refill  . calcium carbonate (TUMS - DOSED IN MG ELEMENTAL CALCIUM) 500 MG chewable tablet Chew 1 tablet by mouth daily.    . cetirizine (ZYRTEC) 10 MG tablet Take 10 mg by mouth daily.    . Crisaborole 2 % OINT Apply topically.    . fluocinolone (VANOS) 0.01 % cream Apply topically 2 (two) times daily.    . Multiple Vitamin (MULTIVITAMIN) tablet Take 1 tablet by mouth daily.    . Omega-3 Fatty Acids (FISH OIL OMEGA-3 PO) Take 2 tablets by mouth daily.    . tamoxifen (NOLVADEX) 20 MG tablet Take 1 tablet (20 mg total) by mouth daily. 90 tablet 12   No current facility-administered medications for this visit.     OBJECTIVE: Middle-aged white  woman who appears well  Vitals:   03/03/18 1348  BP: 133/72  Pulse: 61  Resp: 18  Temp: 97.9 F (36.6 C)  SpO2: 100%     Body mass index is 24.21 kg/m.    ECOG FS:1 - Symptomatic but completely ambulatory  Sclerae unicteric, EOMs intact Oropharynx clear and moist No cervical or supraclavicular adenopathy Lungs no rales or rhonchi Heart regular rate and rhythm Abd soft, nontender, positive bowel sounds MSK no focal spinal tenderness, no upper extremity lymphedema Neuro: nonfocal, well oriented, appropriate affect Breasts: The right breast is status post lumpectomy.  It is status post radiation.  There is no evidence of recurrence.  The left breast is benign.  Both axillae are benign.  LAB RESULTS:  CMP     Component Value Date/Time   NA 141 03/03/2018 1314   NA 140 01/28/2017 1312   K 4.5 03/03/2018 1314   K 5.2 (H) 01/28/2017 1312   CL 107 03/03/2018 1314   CO2 26 03/03/2018 1314   CO2 24 01/28/2017 1312   GLUCOSE 92 03/03/2018 1314   GLUCOSE 91 01/28/2017 1312   BUN 15 03/03/2018 1314   BUN 17.3 01/28/2017 1312   CREATININE 0.83 03/03/2018 1314   CREATININE 0.8 01/28/2017 1312   CALCIUM 9.4 03/03/2018 1314   CALCIUM 9.7 01/28/2017 1312   PROT 7.0 03/03/2018 1314   PROT 6.9 01/28/2017 1312   ALBUMIN 4.0 03/03/2018 1314   ALBUMIN 3.9 01/28/2017 1312   AST 17 03/03/2018 1314   AST 24 01/28/2017 1312   ALT 14 03/03/2018 1314   ALT 20 01/28/2017 1312   ALKPHOS 98 03/03/2018 1314   ALKPHOS 86 01/28/2017 1312   BILITOT 0.4 03/03/2018 1314   BILITOT 0.56 01/28/2017 1312   GFRNONAA >60 03/03/2018 1314   GFRAA >60 03/03/2018 1314    INo results found for: SPEP, UPEP  Lab Results  Component Value Date   WBC 6.3 03/03/2018   NEUTROABS 4.1 03/03/2018   HGB 13.7 03/03/2018   HCT 40.3 03/03/2018   MCV 89.6 03/03/2018   PLT 237 03/03/2018      Chemistry      Component Value Date/Time   NA 141 03/03/2018 1314   NA 140 01/28/2017 1312   K 4.5 03/03/2018 1314    K 5.2 (H) 01/28/2017 1312   CL 107 03/03/2018 1314   CO2 26 03/03/2018 1314   CO2 24 01/28/2017 1312   BUN 15 03/03/2018 1314   BUN 17.3 01/28/2017 1312   CREATININE 0.83 03/03/2018 1314   CREATININE 0.8 01/28/2017 1312      Component  Value Date/Time   CALCIUM 9.4 03/03/2018 1314   CALCIUM 9.7 01/28/2017 1312   ALKPHOS 98 03/03/2018 1314   ALKPHOS 86 01/28/2017 1312   AST 17 03/03/2018 1314   AST 24 01/28/2017 1312   ALT 14 03/03/2018 1314   ALT 20 01/28/2017 1312   BILITOT 0.4 03/03/2018 1314   BILITOT 0.56 01/28/2017 1312       No results found for: LABCA2  No components found for: LABCA125  No results for input(s): INR in the last 168 hours.  Urinalysis No results found for: COLORURINE, APPEARANCEUR, LABSPEC, PHURINE, GLUCOSEU, HGBUR, BILIRUBINUR, KETONESUR, PROTEINUR, UROBILINOGEN, NITRITE, LEUKOCYTESUR  STUDIES: Since her last visit, she underwent diagnostic bilateral mammography with CAD and tomography on 02/12/2018 at [facility] showing: breast density category B. There was no evidence of malignancy.   She also completed a bone density on 02/12/2018 at [facility] which showed a T-score of -2.1 osteopenia   ASSESSMENT: 62 y.o. [place] woman status post right breast upper outer quadrant biopsy 02/02/2015 for a clinically multifocal T1c NX, stage IA invasive ductal carcinoma, grade 2 or 3, estrogen and progesterone receptor positive, with HER-2 not amplified and the MIB-1 at 40%  (1) biopsy of a suspicious left breast mass 03/28/2015 showed only usual ductal hyperplasia, no malignancy identified.  (2) genetics testing 03/06/2015 through the Breast/Ovarian gene panel offered by [facility] found no deleterious mutations in ATM, BARD1, BRCA1, BRCA2, BRIP1, CDH1, CHEK2, EPCAM, FANCC, MLH1, MSH2, MSH6, NBN, PALB2, PMS2, PTEN, RAD51C, RAD51D, TP53, and XRCC2.  (a) there was a Variant of Unknown Significance in the BRCA2 gene called [id number]  (3) status post right lumpectomy and  sentinel lymph node sampling 05/29/2015 for an mpT1b pN0, stage IA invasive ductal carcinoma, grade 2, with positive margins  (a) margins cleared with subsequent surgery 06/12/2015  (3) Oncotype score of 35 predicts a risk of outside the breast recurrence within 10 years of 24% if the patient's only systemic therapy is tamoxifen for 5 years. It also predicts significant benefit from chemotherapy.  (4) adjuvant chemotherapy consisting of cyclophosphamide and docetaxel 4, given 3 weeks apart, starting 06/27/2015, completed 08/30/2015  (5) FISH panel returned HER-2 positive. Receive trastuzumab starting 08/09/2015, last dose 07/05/2016  (a) echocardiogram 05/28/2016 showed a well-preserved ejection fraction at 60-65%.  (6) adjuvant radiation 09/26/2015 through 11/10/2015: Site/dose:   The patient initially received a dose of 50.4 Gy in 28 fractions to the breast using whole-breast tangent fields. This was delivered using a 3-D conformal technique. The patient then received a boost to the seroma. This delivered an additional 10 Gy in 5 fractions using a electron field. The total dose was 60.4 Gy.  (7) received anastrozole 02/08/2015 through December 2016, resumed at the completion of radiation April 2017  (a) D[facility]A [facility] at [facility] 03/28/2015 showed osteopenia, with a T score of -2.1  (b) bone density 02/12/2018 showed a T-score of -2.1  (c) to stop anastrozole 01/11/2018  (8) to start tamoxifen August 2019  PLAN: Catherine Lawson will soon be 3 years out from definitive surgery for her breast cancer with no evidence of disease recurrence.  This is very favorable.  She did well with anastrozole and the repeat bone density is essentially stable.  We are going to switch to tamoxifen however in an effort to improve the bone density.  A second problem that she has is vaginal dryness issues.  She understands that while on tamoxifen it is okay for her to use vaginal estrogens.  She will let me know if  she would  like to try that.  I also gave her information on our pelvic health program through physical therapy.  I am delighted that she is mentoring.  If she runs into questions she cannot answer she can give me a call and I will be glad to proper.  She will see me again in November if there are any problems but if there are no problems from the tamoxifen she will return in 1 year.  She knows to call for any other issues that may develop before next visit.  Shah Insley, Virgie Dad, MD  03/03/18 2:17 PM Medical Oncology and Hematology [facility] [address] [place], [place] [address] Tel. [phone]    Fax. [phone]  Alice Rieger, am acting as scribe for Chauncey Cruel MD.  I, Lurline Del MD, have reviewed the above documentation for accuracy and completeness, and I agree with the above.

## 2018-03-03 ENCOUNTER — Inpatient Hospital Stay: Payer: BLUE CROSS/BLUE SHIELD | Attending: Oncology | Admitting: Oncology

## 2018-03-03 ENCOUNTER — Inpatient Hospital Stay: Payer: BLUE CROSS/BLUE SHIELD

## 2018-03-03 ENCOUNTER — Telehealth: Payer: Self-pay | Admitting: Oncology

## 2018-03-03 VITALS — BP 133/72 | HR 61 | Temp 97.9°F | Resp 18 | Ht 65.0 in | Wt 145.5 lb

## 2018-03-03 DIAGNOSIS — Z803 Family history of malignant neoplasm of breast: Secondary | ICD-10-CM | POA: Diagnosis not present

## 2018-03-03 DIAGNOSIS — Z923 Personal history of irradiation: Secondary | ICD-10-CM | POA: Insufficient documentation

## 2018-03-03 DIAGNOSIS — Z79811 Long term (current) use of aromatase inhibitors: Secondary | ICD-10-CM | POA: Insufficient documentation

## 2018-03-03 DIAGNOSIS — Z170 Estrogen receptor positive status [ER+]: Secondary | ICD-10-CM | POA: Diagnosis not present

## 2018-03-03 DIAGNOSIS — Z800 Family history of malignant neoplasm of digestive organs: Secondary | ICD-10-CM | POA: Insufficient documentation

## 2018-03-03 DIAGNOSIS — C50411 Malignant neoplasm of upper-outer quadrant of right female breast: Secondary | ICD-10-CM | POA: Insufficient documentation

## 2018-03-03 DIAGNOSIS — M8580 Other specified disorders of bone density and structure, unspecified site: Secondary | ICD-10-CM

## 2018-03-03 DIAGNOSIS — Z9221 Personal history of antineoplastic chemotherapy: Secondary | ICD-10-CM | POA: Insufficient documentation

## 2018-03-03 LAB — COMPREHENSIVE METABOLIC PANEL
ALT: 14 U/L (ref 0–44)
ANION GAP: 8 (ref 5–15)
AST: 17 U/L (ref 15–41)
Albumin: 4 g/dL (ref 3.5–5.0)
Alkaline Phosphatase: 98 U/L (ref 38–126)
BUN: 15 mg/dL (ref 6–20)
CHLORIDE: 107 mmol/L (ref 98–111)
CO2: 26 mmol/L (ref 22–32)
Calcium: 9.4 mg/dL (ref 8.9–10.3)
Creatinine, Ser: 0.83 mg/dL (ref 0.44–1.00)
GFR calc non Af Amer: >60 mL/min (ref >60)
Glucose, Bld: 92 mg/dL (ref 70–99)
POTASSIUM: 4.5 mmol/L (ref 3.5–5.1)
Sodium: 141 mmol/L (ref 135–145)
Total Bilirubin: 0.4 mg/dL (ref 0.3–1.2)
Total Protein: 7 g/dL (ref 6.5–8.1)

## 2018-03-03 LAB — CBC WITH DIFFERENTIAL/PLATELET
Basophils Absolute: 0 10*3/uL (ref 0.0–0.1)
Basophils Relative: 1 %
EOS ABS: 0.1 10*3/uL (ref 0.0–0.5)
EOS PCT: 2 %
HCT: 40.3 % (ref 34.8–46.6)
Hemoglobin: 13.7 g/dL (ref 11.6–15.9)
LYMPHS ABS: 1.8 10*3/uL (ref 0.9–3.3)
Lymphocytes Relative: 28 %
MCH: 30.4 pg (ref 25.1–34.0)
MCHC: 34 g/dL (ref 31.5–36.0)
MCV: 89.6 fL (ref 79.5–101.0)
MONOS PCT: 5 %
Monocytes Absolute: 0.3 10*3/uL (ref 0.1–0.9)
Neutro Abs: 4.1 10*3/uL (ref 1.5–6.5)
Neutrophils Relative %: 64 %
PLATELETS: 237 10*3/uL (ref 145–400)
RBC: 4.5 MIL/uL (ref 3.70–5.45)
RDW: 13.2 % (ref 11.2–14.5)
WBC: 6.3 10*3/uL (ref 3.9–10.3)

## 2018-03-03 MED ORDER — TAMOXIFEN CITRATE 20 MG PO TABS: 20.0000 mg | ORAL_TABLET | Freq: Every day | ORAL | 12 refills | Status: AC

## 2018-03-03 NOTE — Telephone Encounter (Signed)
Per 8/20 patient decline avs and calendar

## 2018-04-08 NOTE — Telephone Encounter (Signed)
Dr. Fuller Plan has reviewed records and patient has family hx of colon cancer in his father at age 61. Patient due for Colonoscopy now and ok to schedule.

## 2018-04-09 ENCOUNTER — Ambulatory Visit (AMBULATORY_SURGERY_CENTER): Payer: Self-pay | Admitting: *Deleted

## 2018-04-09 VITALS — Ht 65.0 in | Wt 146.0 lb

## 2018-04-09 DIAGNOSIS — N879 Dysplasia of cervix uteri, unspecified: Secondary | ICD-10-CM | POA: Insufficient documentation

## 2018-04-09 DIAGNOSIS — E663 Overweight: Secondary | ICD-10-CM | POA: Insufficient documentation

## 2018-04-09 DIAGNOSIS — Z800 Family history of malignant neoplasm of digestive organs: Secondary | ICD-10-CM

## 2018-04-09 DIAGNOSIS — N951 Menopausal and female climacteric states: Secondary | ICD-10-CM | POA: Insufficient documentation

## 2018-04-09 DIAGNOSIS — Z8371 Family history of colonic polyps: Secondary | ICD-10-CM

## 2018-04-09 MED ORDER — NA SULFATE-K SULFATE-MG SULF 17.5-3.13-1.6 GM/177ML PO SOLN: ORAL | 0 refills | Status: DC

## 2018-04-09 NOTE — Progress Notes (Signed)
Patient denies any allergies to eggs or soy. Patient denies any problems with anesthesia/sedation. Patient denies any oxygen use at home. Patient denies taking any diet/weight loss medications or blood thinners. EMMI education assisgned to patient on colonoscopy, this was explained and instructions given to patient. suprep coupon $50 given to pt.

## 2018-04-15 ENCOUNTER — Encounter: Payer: Self-pay | Admitting: Gastroenterology

## 2018-04-24 DIAGNOSIS — R1013 Epigastric pain: Secondary | ICD-10-CM | POA: Diagnosis not present

## 2018-04-29 ENCOUNTER — Encounter: Payer: Self-pay | Admitting: Gastroenterology

## 2018-04-29 ENCOUNTER — Ambulatory Visit (AMBULATORY_SURGERY_CENTER): Payer: BLUE CROSS/BLUE SHIELD | Admitting: Gastroenterology

## 2018-04-29 VITALS — BP 125/54 | HR 52 | Temp 97.8°F | Resp 18 | Ht 65.0 in | Wt 146.0 lb

## 2018-04-29 DIAGNOSIS — Z8371 Family history of colonic polyps: Secondary | ICD-10-CM | POA: Diagnosis not present

## 2018-04-29 DIAGNOSIS — D123 Benign neoplasm of transverse colon: Secondary | ICD-10-CM | POA: Diagnosis not present

## 2018-04-29 DIAGNOSIS — K635 Polyp of colon: Secondary | ICD-10-CM

## 2018-04-29 DIAGNOSIS — Z1211 Encounter for screening for malignant neoplasm of colon: Secondary | ICD-10-CM | POA: Diagnosis not present

## 2018-04-29 DIAGNOSIS — Z800 Family history of malignant neoplasm of digestive organs: Secondary | ICD-10-CM

## 2018-04-29 DIAGNOSIS — Z83719 Family history of colon polyps, unspecified: Secondary | ICD-10-CM

## 2018-04-29 MED ORDER — SODIUM CHLORIDE 0.9 % IV SOLN: 500.0000 mL | Freq: Once | INTRAVENOUS | Status: DC

## 2018-04-29 NOTE — Progress Notes (Signed)
Pt's states no medical or surgical changes since previsit or office visit. 

## 2018-04-29 NOTE — Progress Notes (Signed)
A/ox3 pleased with MAC, report to RN 

## 2018-04-29 NOTE — Op Note (Signed)
[facility] Patient Name: Catherine Lawson Procedure Date: 04/29/2018 8:05 AM MRN: [id number] Endoscopist: Ladene Artist , MD Age: 62 Referring MD:  Date of Birth: 02/10/1956 Gender: Female Account #: [id number] Procedure:                Colonoscopy Indications:              Colon cancer screening in patient at increased                            risk: Family history of 1st-degree relative with                            colon polyps, Screening in patient at increased                            risk: Family history of 1st-degree relative with                            colorectal cancer Medicines:                Monitored Anesthesia Care Procedure:                Pre-Anesthesia Assessment:                           - Prior to the procedure, a History and Physical                            was performed, and patient medications and                            allergies were reviewed. The patient's tolerance of                            previous anesthesia was also reviewed. The risks                            and benefits of the procedure and the sedation                            options and risks were discussed with the patient.                            All questions were answered, and informed consent                            was obtained. Prior Anticoagulants: The patient has                            taken no previous anticoagulant or antiplatelet                            agents. ASA Grade Assessment: II - A patient with  mild systemic disease. After reviewing the risks                            and benefits, the patient was deemed in                            satisfactory condition to undergo the procedure.                           After obtaining informed consent, the colonoscope                            was passed under direct vision. Throughout the                            procedure, the patient's blood pressure, pulse, and                             oxygen saturations were monitored continuously. The                            Colonoscope was introduced through the anus and                            advanced to the the cecum, identified by                            appendiceal orifice and ileocecal valve. The                            ileocecal valve, appendiceal orifice, and rectum                            were photographed. The quality of the bowel                            preparation was excellent. The colonoscopy was                            performed without difficulty. The patient tolerated                            the procedure well. Scope In: 8:10:00 AM Scope Out: 8:37:50 AM Scope Withdrawal Time: 0 hours 23 minutes 49 seconds  Total Procedure Duration: 0 hours 27 minutes 50 seconds  Findings:                 The perianal and digital rectal examinations were                            normal.                           A 17 mm polyp was found in the transverse colon.  The polyp was sessile. The polyp was removed with a                            saline injection-lift technique using a hot snare                            with 4 cc of saline. The polyp was removed with a                            piecemeal technique using a hot snare. Resection                            and retrieval were complete. Area was tattooed with                            injections of 3 mL of Spot (carbon black) in 2                            sites, 1 proximal to and 1 distal to the site.                           Multiple medium-mouthed diverticula were found in                            the sigmoid colon, descending colon, transverse                            colon and ascending colon. There was no evidence of                            diverticular bleeding.                           The exam was otherwise without abnormality on                            direct and retroflexion  views. Complications:            No immediate complications. Estimated blood loss:                            None. Estimated Blood Loss:     Estimated blood loss: none. Impression:               - One 17 mm polyp in the transverse colon, removed                            with a hot snare, removed using injection-lift and                            a hot snare, removed piecemeal using a hot snare  and removed with a cold snare. Resected and                            retrieved. Tattooed.                           - Moderate diverticulosis in the sigmoid colon, in                            the descending colon, in the transverse colon and                            in the ascending colon.                           - The examination was otherwise normal on direct                            and retroflexion views. Recommendation:           - Repeat colonoscopy in 6 months for surveillance                            after piecemeal polypectomy.                           - Patient has a contact number available for                            emergencies. The signs and symptoms of potential                            delayed complications were discussed with the                            patient. Return to normal activities tomorrow.                            Written discharge instructions were provided to the                            patient.                           - High fiber diet.                           - Continue present medications.                           - Await pathology results.                           - No aspirin, ibuprofen, naproxen, or other                            non-steroidal anti-inflammatory drugs for  2 weeks                            after polyp removal. Ladene Artist, MD 04/29/2018 8:45:09 AM This report has been signed electronically.

## 2018-04-29 NOTE — Patient Instructions (Signed)
Please read handouts provided. High fiber diet. No aspirin, ibuprofen, naproxen, or other non-steriodal anti-inflammatory drugs for 2 weeks.     YOU HAD AN ENDOSCOPIC PROCEDURE TODAY AT [facility]:   Refer to the procedure report that was given to you for any specific questions about what was found during the examination.  If the procedure report does not answer your questions, please call your gastroenterologist to clarify.  If you requested that your care partner not be given the details of your procedure findings, then the procedure report has been included in a sealed envelope for you to review at your convenience later.  YOU SHOULD EXPECT: Some feelings of bloating in the abdomen. Passage of more gas than usual.  Walking can help get rid of the air that was put into your GI tract during the procedure and reduce the bloating. If you had a lower endoscopy (such as a colonoscopy or flexible sigmoidoscopy) you may notice spotting of blood in your stool or on the toilet paper. If you underwent a bowel prep for your procedure, you may not have a normal bowel movement for a few days.  Please Note:  You might notice some irritation and congestion in your nose or some drainage.  This is from the oxygen used during your procedure.  There is no need for concern and it should clear up in a day or so.  SYMPTOMS TO REPORT IMMEDIATELY:   Following lower endoscopy (colonoscopy or flexible sigmoidoscopy):  Excessive amounts of blood in the stool  Significant tenderness or worsening of abdominal pains  Swelling of the abdomen that is new, acute  Fever of 100F or higher   For urgent or emergent issues, a gastroenterologist can be reached at any hour by calling [phone].   DIET:  We do recommend a small meal at first, but then you may proceed to your regular diet.  Drink plenty of fluids but you should avoid alcoholic beverages for 24 hours.  ACTIVITY:  You should plan to take  it easy for the rest of today and you should NOT DRIVE or use heavy machinery until tomorrow (because of the sedation medicines used during the test).    FOLLOW UP: Our staff will call the number listed on your records the next business day following your procedure to check on you and address any questions or concerns that you may have regarding the information given to you following your procedure. If we do not reach you, we will leave a message.  However, if you are feeling well and you are not experiencing any problems, there is no need to return our call.  We will assume that you have returned to your regular daily activities without incident.  If any biopsies were taken you will be contacted by phone or by letter within the next 1-3 weeks.  Please call us at [phone] if you have not heard about the biopsies in 3 weeks.    SIGNATURES/CONFIDENTIALITY: You and/or your care partner have signed paperwork which will be entered into your electronic medical record.  These signatures attest to the fact that that the information above on your After Visit Summary has been reviewed and is understood.  Full responsibility of the confidentiality of this discharge information lies with you and/or your care-partner.

## 2018-04-29 NOTE — Progress Notes (Signed)
Called to room to assist during endoscopic procedure.  Patient ID and intended procedure confirmed with present staff. Received instructions for my participation in the procedure from the performing physician.  

## 2018-04-30 ENCOUNTER — Telehealth: Payer: Self-pay

## 2018-04-30 NOTE — Telephone Encounter (Signed)
Left message

## 2018-04-30 NOTE — Telephone Encounter (Signed)
No answer, left message to call back later today, B.Girard Koontz RN. 

## 2018-05-11 ENCOUNTER — Encounter: Payer: Self-pay | Admitting: Gastroenterology

## 2018-06-01 ENCOUNTER — Telehealth: Payer: Self-pay | Admitting: Oncology

## 2018-06-01 NOTE — Telephone Encounter (Signed)
Patient called to cancel °

## 2018-06-08 ENCOUNTER — Ambulatory Visit: Payer: BLUE CROSS/BLUE SHIELD | Admitting: Oncology

## 2018-07-01 NOTE — Progress Notes (Signed)
Catherine Lawson received her flu shot on 06/30/18 to the LT deltoid at the [facility] by the undersigned.  Lot #[id number] NDC: 16435-391-22 Mfg: [id number] Exp: 01/12/19

## 2018-08-18 ENCOUNTER — Encounter: Payer: Self-pay | Admitting: Gastroenterology

## 2018-09-29 ENCOUNTER — Telehealth: Payer: Self-pay | Admitting: *Deleted

## 2018-09-29 NOTE — Telephone Encounter (Addendum)
Covid-19 travel screening questions  Have you traveled in the last 14 days? no If yes where?  Do you now or have you had a fever in the last 14 days? no  Do you have any respiratory symptoms of shortness of breath or cough now or in the last 14 days? no  Do you have a medical history of Congestive Heart Failure? no  Do you have a medical history of lung disease? no  Do you have any family members or close contacts with diagnosed or suspected Covid-19? no   [id number] to call back

## 2018-09-29 NOTE — Telephone Encounter (Signed)
Pt return call °

## 2018-09-30 ENCOUNTER — Other Ambulatory Visit: Payer: Self-pay

## 2018-09-30 ENCOUNTER — Ambulatory Visit (AMBULATORY_SURGERY_CENTER): Payer: Self-pay | Admitting: *Deleted

## 2018-09-30 ENCOUNTER — Encounter: Payer: Self-pay | Admitting: Gastroenterology

## 2018-09-30 VITALS — Temp 98.3°F | Ht 65.0 in | Wt 151.6 lb

## 2018-09-30 DIAGNOSIS — Z86010 Personal history of colonic polyps: Secondary | ICD-10-CM

## 2018-09-30 MED ORDER — SUPREP BOWEL PREP KIT 17.5-3.13-1.6 GM/177ML PO SOLN: 1.0000 | Freq: Once | ORAL | 0 refills | Status: AC

## 2018-09-30 NOTE — Progress Notes (Signed)
No egg or soy allergy known to patient  No issues with past sedation with any surgeries  or procedures, no intubation problems  No diet pills per patient No home 02 use per patient  No blood thinners per patient  Pt denies issues with constipation  No A fib or A flutter  EMMI video offered and declined 

## 2018-10-07 ENCOUNTER — Telehealth: Payer: Self-pay | Admitting: *Deleted

## 2018-10-07 NOTE — Telephone Encounter (Signed)
Entered in error. SM

## 2018-10-14 ENCOUNTER — Telehealth: Payer: Self-pay | Admitting: *Deleted

## 2018-10-14 ENCOUNTER — Encounter: Payer: BLUE CROSS/BLUE SHIELD | Admitting: Gastroenterology

## 2018-10-14 ENCOUNTER — Encounter: Payer: PRIVATE HEALTH INSURANCE | Admitting: Gastroenterology

## 2018-10-14 NOTE — Telephone Encounter (Signed)
Left message on VM for patient to inform her that her 4/6 appointment is cancelled d/t covid restrictions.  Also informed her that Dr. Fuller Plan would like for her to reschedule for June.  Advised her to call back to confirm.

## 2018-10-14 NOTE — Telephone Encounter (Signed)
Pt called and confirmed the msg that was lft

## 2018-10-19 ENCOUNTER — Encounter: Payer: PRIVATE HEALTH INSURANCE | Admitting: Gastroenterology

## 2018-11-09 ENCOUNTER — Telehealth: Payer: Self-pay | Admitting: *Deleted

## 2018-11-09 NOTE — Telephone Encounter (Signed)
Phone call to patient to reschedule colonoscopy cancelled due to Covid-19. Left message. I need to speak to patient to determine if Rx needs to be sent for prep and complete new prep instructions.

## 2018-11-09 NOTE — Telephone Encounter (Signed)
Pt returned call---colon rescheduled to 11/19/18 at 0900. Pt has prep, instructions redone and mailed to patient.

## 2018-11-17 ENCOUNTER — Telehealth: Payer: Self-pay | Admitting: *Deleted

## 2018-11-17 NOTE — Telephone Encounter (Signed)
Covid-19 travel screening questions  Have you traveled in the last 14 days?no If yes where?  Do you now or have you had a fever in the last 14 days?no  Do you have any respiratory symptoms of shortness of breath or cough now or in the last 14 days?no  Do you have a medical history of Congestive Heart Failure?  Do you have a medical history of lung disease?  Do you have any family members or close contacts with diagnosed or suspected Covid-19?no  Spoke with the patient about the care partner policy and she is aware to bring her own PPE if she has it. Sm

## 2018-11-19 ENCOUNTER — Other Ambulatory Visit: Payer: Self-pay

## 2018-11-19 ENCOUNTER — Ambulatory Visit (AMBULATORY_SURGERY_CENTER): Payer: PRIVATE HEALTH INSURANCE | Admitting: Gastroenterology

## 2018-11-19 ENCOUNTER — Encounter: Payer: Self-pay | Admitting: Gastroenterology

## 2018-11-19 VITALS — BP 134/59 | HR 57 | Temp 98.6°F | Resp 12 | Ht 65.0 in | Wt 151.0 lb

## 2018-11-19 DIAGNOSIS — Z86010 Personal history of colonic polyps: Secondary | ICD-10-CM | POA: Diagnosis present

## 2018-11-19 DIAGNOSIS — D125 Benign neoplasm of sigmoid colon: Secondary | ICD-10-CM

## 2018-11-19 MED ORDER — SODIUM CHLORIDE 0.9 % IV SOLN: 500.0000 mL | Freq: Once | INTRAVENOUS | Status: DC

## 2018-11-19 NOTE — Progress Notes (Signed)
To PACU, VSS. Report to [email or web address] 

## 2018-11-19 NOTE — Op Note (Signed)
[facility] Patient Name: Catherine Lawson Procedure Date: 11/19/2018 9:29 AM MRN: [id number] Endoscopist: Ladene Artist , MD Age: 63 Referring MD:  Date of Birth: Mar 24, 1956 Gender: Female Account #: [id number] Procedure:                Colonoscopy Indications:              Surveillance: Personal history of piecemeal removal                            of large sessile polyp on last colonoscopy 6 months                            ago Medicines:                Monitored Anesthesia Care Procedure:                Pre-Anesthesia Assessment:                           - Prior to the procedure, a History and Physical                            was performed, and patient medications and                            allergies were reviewed. The patient's tolerance of                            previous anesthesia was also reviewed. The risks                            and benefits of the procedure and the sedation                            options and risks were discussed with the patient.                            All questions were answered, and informed consent                            was obtained. Prior Anticoagulants: The patient has                            taken no previous anticoagulant or antiplatelet                            agents. ASA Grade Assessment: II - A patient with                            mild systemic disease. After reviewing the risks                            and benefits, the patient was deemed in  satisfactory condition to undergo the procedure.                           After obtaining informed consent, the colonoscope                            was passed under direct vision. Throughout the                            procedure, the patient's blood pressure, pulse, and                            oxygen saturations were monitored continuously. The                            Colonoscope was introduced through the anus and                          advanced to the the cecum, identified by                            appendiceal orifice and ileocecal valve. The                            ileocecal valve, appendiceal orifice, and rectum                            were photographed. The quality of the bowel                            preparation was excellent. The colonoscopy was                            performed without difficulty. The patient tolerated                            the procedure well. Scope In: 9:39:00 AM Scope Out: 9:59:08 AM Scope Withdrawal Time: 0 hours 15 minutes 47 seconds  Total Procedure Duration: 0 hours 20 minutes 8 seconds  Findings:                 The perianal and digital rectal examinations were                            normal.                           A 7 mm polyp was found in the sigmoid colon. The                            polyp was sessile. The polyp was removed with a                            cold snare. Resection and retrieval were complete.  A tattoo was seen in the transverse colon. A                            post-polypectomy scar was found at the tattoo site.                            There was no evidence of residual polyp tissue.                           Multiple medium-mouthed diverticula were found in                            the sigmoid colon, descending colon, transverse                            colon and ascending colon. There was no evidence of                            diverticular bleeding.                           The exam was otherwise without abnormality on                            direct and retroflexion views. Complications:            No immediate complications. Estimated blood loss:                            None. Estimated Blood Loss:     Estimated blood loss: none. Impression:               - One 7 mm polyp in the sigmoid colon, removed with                            a cold snare. Resected and retrieved.                            - A tattoo was seen in the transverse colon. A                            post-polypectomy scar was found at the tattoo site.                            There was no evidence of residual polyp tissue.                           - Moderate diverticulosis in the sigmoid colon, in                            the descending colon, in the transverse colon and                            in the ascending colon.                           -  The examination was otherwise normal on direct                            and retroflexion views. Recommendation:           - Repeat colonoscopy in 3 years for surveillance.                           - Patient has a contact number available for                            emergencies. The signs and symptoms of potential                            delayed complications were discussed with the                            patient. Return to normal activities tomorrow.                            Written discharge instructions were provided to the                            patient.                           - High fiber diet.                           - Continue present medications.                           - Await pathology results. Ladene Artist, MD 11/19/2018 10:05:20 AM This report has been signed electronically.

## 2018-11-19 NOTE — Patient Instructions (Signed)
YOU HAD AN ENDOSCOPIC PROCEDURE TODAY AT [facility]:   Refer to the procedure report that was given to you for any specific questions about what was found during the examination.  If the procedure report does not answer your questions, please call your gastroenterologist to clarify.  If you requested that your care partner not be given the details of your procedure findings, then the procedure report has been included in a sealed envelope for you to review at your convenience later.  YOU SHOULD EXPECT: Some feelings of bloating in the abdomen. Passage of more gas than usual.  Walking can help get rid of the air that was put into your GI tract during the procedure and reduce the bloating. If you had a lower endoscopy (such as a colonoscopy or flexible sigmoidoscopy) you may notice spotting of blood in your stool or on the toilet paper. If you underwent a bowel prep for your procedure, you may not have a normal bowel movement for a few days.  Please Note:  You might notice some irritation and congestion in your nose or some drainage.  This is from the oxygen used during your procedure.  There is no need for concern and it should clear up in a day or so.  SYMPTOMS TO REPORT IMMEDIATELY:   Following lower endoscopy (colonoscopy or flexible sigmoidoscopy):  Excessive amounts of blood in the stool  Significant tenderness or worsening of abdominal pains  Swelling of the abdomen that is new, acute  Fever of 100F or higher   For urgent or emergent issues, a gastroenterologist can be reached at any hour by calling [phone].   DIET:  We do recommend a small meal at first, but then you may proceed to your regular diet.  Drink plenty of fluids but you should avoid alcoholic beverages for 24 hours.  ACTIVITY:  You should plan to take it easy for the rest of today and you should NOT DRIVE or use heavy machinery until tomorrow (because of the sedation medicines used during the test).     FOLLOW UP: Our staff will call the number listed on your records the next business day following your procedure to check on you and address any questions or concerns that you may have regarding the information given to you following your procedure. If we do not reach you, we will leave a message.  However, if you are feeling well and you are not experiencing any problems, there is no need to return our call.  We will assume that you have returned to your regular daily activities without incident. We will be calling you two weeks following your procedure to see if you have developed any symptoms of the COVID-19.  If you develop any symptoms before then, please let us know.  If any biopsies were taken you will be contacted by phone or by letter within the next 1-3 weeks.  Please call us at [phone] if you have not heard about the biopsies in 3 weeks.   Await for biopsy results Polyps (handout given) Diverticulosis (handout given) Repeat colonoscopy 3 years for surveillance High Fiber Diet (handout given)  SIGNATURES/CONFIDENTIALITY: You and/or your care partner have signed paperwork which will be entered into your electronic medical record.  These signatures attest to the fact that that the information above on your After Visit Summary has been reviewed and is understood.  Full responsibility of the confidentiality of this discharge information lies with you and/or your care-partner.

## 2018-11-19 NOTE — Progress Notes (Signed)
Called to room to assist during endoscopic procedure.  Patient ID and intended procedure confirmed with present staff. Received instructions for my participation in the procedure from the performing physician.  

## 2018-11-19 NOTE — Progress Notes (Signed)
Oral temp taken per [facility], Vital signs taken per [facility] Mote.

## 2018-11-19 NOTE — Progress Notes (Signed)
Pt's states no medical or surgical changes since previsit or office visit. 

## 2018-11-23 ENCOUNTER — Telehealth: Payer: Self-pay

## 2018-11-23 ENCOUNTER — Encounter: Payer: Self-pay | Admitting: Gastroenterology

## 2018-11-23 NOTE — Telephone Encounter (Signed)
2nd call back for follow up from colonoscopy, no answer, left message for pt to call if she is having any problems or questions.

## 2018-11-23 NOTE — Telephone Encounter (Signed)
Left message on follow up call. 

## 2018-11-26 ENCOUNTER — Telehealth: Payer: Self-pay | Admitting: *Deleted

## 2018-11-26 NOTE — Telephone Encounter (Signed)
1. Have you developed a fever since your procedure? no  2.   Have you had an respiratory symptoms (SOB or cough) since your procedure? no  3.   Have you tested positive for COVID 19 since your procedure no  3.   Have you had any family members/close contacts diagnosed with the COVID 19 since your procedure?  no   If any of these questions are a yes, please inquire if patient has been seen by family doctor and route this note to Tracy Walton, RN.  

## 2019-02-23 ENCOUNTER — Encounter: Payer: Self-pay | Admitting: Oncology

## 2019-03-03 NOTE — Progress Notes (Signed)
[place]  [phone] [phone]) [phone]   ID: BRIHANA QUICKEL DOB: 1955-09-24  MR#: [id number]  SKA#:[id number]  Patient Care Team: Cari Caraway, MD as PCP - General (Family Medicine) Excell Seltzer, MD as Consulting Physician (General Surgery) Magrinat, Virgie Dad, MD as Consulting Physician (Oncology) Kyung Rudd, MD as Consulting Physician (Radiation Oncology) Mauro Kaufmann, RN as Registered Nurse Rockwell Germany, RN as Registered Nurse Jake Shark, Johny Blamer, NP as Nurse Practitioner (Nurse Practitioner) Vania Rea, MD as Consulting Physician (Obstetrics and Gynecology) Thompson Grayer, MD as Consulting Physician (Cardiology) PCP: Cari Caraway, MD OTHER MD:  CHIEF COMPLAINT: Estrogen receptor positive breast cancer  CURRENT TREATMENT:  tamoxifen  BREAST CANCER HISTORY: From the original intake note:     "[name]" had routine screening mammography at [facility] 01/24/2015. This showed a 1.6 cm mass in the right breast at the 11:00 position, and on 0.8 cm irregular density also at the 11:00 position farther away from the nipple. On 02/01/2015 she underwent a unilateral right diagnostic mammography at [facility]. The breast density was category B. In addition to the 2 masses previously noted there was a cluster of calcifications at the 9:00 position. Ultrasonography the same day showed a 1.9 cm oval mass, which was hypoechoic with no vascularity and hard on L a Stogner if he, a 7 mm mass at the 10:00 position with no vascularity and intermediate L a Stogner 3, and a lymph node with uniform cortex thickening in the right axilla. The 2 masses in question were separated by 2 cm.  On 02/02/2015 the patient underwent biopsy of what appears to have been the larger of the 2 breast masses (I cannot locate the biopsy report). This documented invasive ductal carcinoma, grade 2 or 3, with micropapillary features, estrogen and progesterone receptor positive, with an MIB-1  of 40%, and HER-2 not amplified with a signals ratio of 1.36 and a number per cell of 3.60. There was evidence of lymphovascular invasion.  Because of bleeding from the first biopsy, the second breast mass could not be performed. The lymph node was aspirated, not cord, and this appeared benign but the concordance is questionable.  The patient's subsequent history is as detailed below   INTERVAL HISTORY: [name] returns today for follow-up and treatment of her estrogen receptor positive breast cancer. She was last seen on 03/03/2018.   She continues on tamoxifen.  She has essentially no side effects from this.  She has minimal hot flashes.  The vaginal dryness she was experiencing is greatly improved.  [name]'s last bone density screening on 03/28/2015, showed a T-score of -2.1, which is considered osteopenic.   Mammography at [facility] 02/16/2019 shows breast density to be category B.  There is no evidence of malignancy.  REVIEW OF SYSTEMS.  [name] describes herself as an introvert so she "does not mind" staying home all the time because of the virus.  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  They also have an elliptical.  They go to church online and they do a Bible study online.  Her husband does work outside the home.  He is very careful otherwise.  She has an excellent diet and exercise program but cannot get to the weight she would like.  A detailed review of systems today was otherwise stable  PAST MEDICAL HISTORY: Past Medical History:  Diagnosis Date  . Allergy   . Arthritis    bil thumb joints  . Breast cancer ([place]) 06/2015  .  Breast cancer of upper-outer quadrant of right female breast ([place]) 02/06/2015   sx ,chemo,radiation  . Family history of breast cancer   . Family history of colon cancer   . Family history of stomach cancer   . SVT (supraventricular tachycardia) (HCC)    fast HR at times due to stress, no chest pain or SOB    PAST SURGICAL HISTORY: Past  Surgical History:  Procedure Laterality Date  . COLONOSCOPY    . ELECTROPHYSIOLOGIC STUDY N/A 11/28/2015   left lateral accessory pathway and slow pathway ablation performed by Dr Rayann Heman  . POLYPECTOMY    . PORTACATH PLACEMENT Right 06/27/2015   Procedure: INSERTION PORT-A-CATH;  Surgeon: Excell Seltzer, MD;  Location: [facility];  Service: General;  Laterality: Right;  . RADIOACTIVE SEED GUIDED PARTIAL MASTECTOMY WITH AXILLARY SENTINEL LYMPH NODE BIOPSY Right 05/29/2015   Procedure: RADIOACTIVE SEED RIGHT BREAST LUMPECTOMY WITH AXILLARY SENTINEL LYMPH NODE BIOPSY;  Surgeon: Excell Seltzer, MD;  Location: [facility];  Service: General;  Laterality: Right;  . RE-EXCISION OF BREAST LUMPECTOMY Right 06/12/2015   Procedure: RE-EXCISION OF RIGHT  BREAST LUMPECTOMY;  Surgeon: Excell Seltzer, MD;  Location: [facility];  Service: General;  Laterality: Right;  . WISDOM TOOTH EXTRACTION      FAMILY HISTORY Family History  Problem Relation Age of Onset  . Breast cancer Sister 110  . Colon cancer Father 34  . Cancer Maternal Grandmother        probable ovarian cancer  . Stomach cancer Paternal Grandmother 55  . Brain cancer Other 10       Medulloblastoma  . Parkinson's disease Mother   . Lung cancer Paternal Uncle        smoker  . Stomach cancer Other        PGMs sister  . Stomach cancer Other        PMGs brother  . Colon polyps Brother   . Colon polyps Sister   . Esophageal cancer Neg Hx   . Rectal cancer Neg Hx    the patient's father is still living, at age 43. The patient's mother died from Parkinson's disease complications at age 32. The patient had 5 brothers, 4 sisters. One sister was diagnosed with breast cancer at age 108 and died at age 69. The patient's father was diagnosed with colon cancer at the age of 79. A nephew was diagnosed with medulloblastoma at age 52 and died at age 54. The paternal grandmother was diagnosed with stomach cancer at age 55.  The maternal grandmother was diagnosed with metastatic cancer of unknown type at age 29. (The description is suggestive of an ovarian cancer).  GYNECOLOGIC HISTORY:  No LMP recorded. Patient is postmenopausal. Menarche age 37. The patient is GX P0. She stopped having periods in 2014. She did not take hormone replacement. She took oral contraceptives for approximately 20 years with no complications.  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 Her husband [name] works for a Merchandiser, retail in [employer] "[place] and [employer]ta [employer] credited Warehouse manager". [name] has 2 daughters from an earlier marriage, Ronalee Belts lives in [place] and works in recruiting, and Jaidan Stachnik lives in [place] and works in Press photographer. They have 1 grandchild. The patient attends the [employer]   HEALTH MAINTENANCE: Social History   Tobacco Use  . Smoking status: Never Smoker  . Smokeless tobacco: Never Used  Substance Use Topics  .  Alcohol use: Yes    Alcohol/week: 4.0 - 5.0 standard drinks    Types: 4 - 5 Glasses of wine per week    Comment: social  . Drug use: No     Colonoscopy: 2014/lobe are  PAP: 2015  Bone density: 02/12/2018 showed a T score of -2.1  Lipid panel:  No Known Allergies  Current Outpatient Medications  Medication Sig Dispense Refill  . calcium carbonate (TUMS - DOSED IN MG ELEMENTAL CALCIUM) 500 MG chewable tablet Chew 1 tablet by mouth daily.    . cetirizine (ZYRTEC) 10 MG tablet Take 10 mg by mouth daily.    Stasia Cavalier (EUCRISA) 2 % OINT Eucrisa 2 % topical ointment    . fluocinolone (VANOS) 0.01 % cream Apply topically 2 (two) times daily.    . Multiple Vitamin (MULTIVITAMIN) tablet Take 1 tablet by mouth daily.    . Omega-3 Fatty Acids (FISH OIL OMEGA-3 PO) Take 2 tablets by mouth daily.    . Probiotic Product (PROBIOTIC PO) Take by mouth.    . tamoxifen (NOLVADEX) 10 MG tablet Take 10 mg by mouth  daily.     Current Facility-Administered Medications  Medication Dose Route Frequency Provider Last Rate Last Dose  . 0.9 %  sodium chloride infusion  500 mL Intravenous Once Ladene Artist, MD        OBJECTIVE: Middle-aged white woman in no acute distress  Vitals:   03/04/19 1342  BP: (!) 114/53  Pulse: 72  Resp: 18  Temp: 99.1 F (37.3 C)  SpO2: 100%    Body mass index is 25.34 kg/m.     ECOG FS:1 - Symptomatic but completely ambulatory  Sclerae unicteric, EOMs intact Wearing a mask No cervical or supraclavicular adenopathy Lungs no rales or rhonchi Heart regular rate and rhythm Abd soft, nontender, positive bowel sounds MSK no focal spinal tenderness, no upper extremity lymphedema Neuro: nonfocal, well oriented, appropriate affect Breasts: On the right she is status post lumpectomy and radiation with no evidence of disease recurrence.  This is very favorable.  On the left the breast is unremarkable.  Both axillae are benign.  LAB RESULTS:  CMP     Component Value Date/Time   NA 139 03/04/2019 1319   NA 140 01/28/2017 1312   K 4.5 03/04/2019 1319   K 5.2 (H) 01/28/2017 1312   CL 107 03/04/2019 1319   CO2 24 03/04/2019 1319   CO2 24 01/28/2017 1312   GLUCOSE 91 03/04/2019 1319   GLUCOSE 91 01/28/2017 1312   BUN 17 03/04/2019 1319   BUN 17.3 01/28/2017 1312   CREATININE 0.92 03/04/2019 1319   CREATININE 0.8 01/28/2017 1312   CALCIUM 8.5 (L) 03/04/2019 1319   CALCIUM 9.7 01/28/2017 1312   PROT 6.9 03/04/2019 1319   PROT 6.9 01/28/2017 1312   ALBUMIN 4.0 03/04/2019 1319   ALBUMIN 3.9 01/28/2017 1312   AST 19 03/04/2019 1319   AST 24 01/28/2017 1312   ALT 14 03/04/2019 1319   ALT 20 01/28/2017 1312   ALKPHOS 67 03/04/2019 1319   ALKPHOS 86 01/28/2017 1312   BILITOT 0.4 03/04/2019 1319   BILITOT 0.56 01/28/2017 1312   GFRNONAA >60 03/04/2019 1319   GFRAA >60 03/04/2019 1319    INo results found for: SPEP, UPEP  Lab Results  Component Value Date    WBC 6.3 03/04/2019   NEUTROABS 4.0 03/04/2019   HGB 12.6 03/04/2019   HCT 37.2 03/04/2019   MCV 90.5 03/04/2019   PLT 241  03/04/2019      Chemistry      Component Value Date/Time   NA 139 03/04/2019 1319   NA 140 01/28/2017 1312   K 4.5 03/04/2019 1319   K 5.2 (H) 01/28/2017 1312   CL 107 03/04/2019 1319   CO2 24 03/04/2019 1319   CO2 24 01/28/2017 1312   BUN 17 03/04/2019 1319   BUN 17.3 01/28/2017 1312   CREATININE 0.92 03/04/2019 1319   CREATININE 0.8 01/28/2017 1312      Component Value Date/Time   CALCIUM 8.5 (L) 03/04/2019 1319   CALCIUM 9.7 01/28/2017 1312   ALKPHOS 67 03/04/2019 1319   ALKPHOS 86 01/28/2017 1312   AST 19 03/04/2019 1319   AST 24 01/28/2017 1312   ALT 14 03/04/2019 1319   ALT 20 01/28/2017 1312   BILITOT 0.4 03/04/2019 1319   BILITOT 0.56 01/28/2017 1312       No results found for: LABCA2  No components found for: LABCA125  No results for input(s): INR in the last 168 hours.  Urinalysis No results found for: COLORURINE, APPEARANCEUR, LABSPEC, PHURINE, GLUCOSEU, HGBUR, BILIRUBINUR, KETONESUR, PROTEINUR, UROBILINOGEN, NITRITE, LEUKOCYTESUR   STUDIES: No results found.   ASSESSMENT: 63 y.o. [place] woman status post right breast upper outer quadrant biopsy 02/02/2015 for a clinically multifocal T1c NX, stage IA invasive ductal carcinoma, grade 2 or 3, estrogen and progesterone receptor positive, with HER-2 not amplified and the MIB-1 at 40%  (1) biopsy of a suspicious left breast mass 03/28/2015 showed only usual ductal hyperplasia, no malignancy identified.  (2) genetics testing 03/06/2015 through the Breast/Ovarian gene panel offered by [facility] found no deleterious mutations in ATM, BARD1, BRCA1, BRCA2, BRIP1, CDH1, CHEK2, EPCAM, FANCC, MLH1, MSH2, MSH6, NBN, PALB2, PMS2, PTEN, RAD51C, RAD51D, TP53, and XRCC2.  (a) there was a Variant of Unknown Significance in the BRCA2 gene called [id number]  (3) status post right lumpectomy and  sentinel lymph node sampling 05/29/2015 for an mpT1b pN0, stage IA invasive ductal carcinoma, grade 2, with positive margins  (a) margins cleared with subsequent surgery 06/12/2015  (3) Oncotype score of 35 predicts a risk of outside the breast recurrence within 10 years of 24% if the patient's only systemic therapy is tamoxifen for 5 years. It also predicts significant benefit from chemotherapy.  (4) adjuvant chemotherapy consisting of cyclophosphamide and docetaxel 4, given 3 weeks apart, starting 06/27/2015, completed 08/30/2015  (5) FISH panel returned HER-2 positive. Receive trastuzumab starting 08/09/2015, last dose 07/05/2016  (a) echocardiogram 05/28/2016 showed a well-preserved ejection fraction at 60-65%.  (6) adjuvant radiation 09/26/2015 through 11/10/2015: Site/dose:   The patient initially received a dose of 50.4 Gy in 28 fractions to the breast using whole-breast tangent fields. This was delivered using a 3-D conformal technique. The patient then received a boost to the seroma. This delivered an additional 10 Gy in 5 fractions using a electron field. The total dose was 60.4 Gy.  (7) received anastrozole 02/08/2015 through December 2016, resumed at the completion of radiation April 2017  (a) DEXA scan at [facility] 03/28/2015 showed osteopenia, with a T score of -2.1  (b) bone density 02/12/2018 showed a T-score of -2.1  (c) stopped anastrozole 01/11/2018  (8) started tamoxifen August 2019   PLAN: [name] is now nearly 4 years out from definitive surgery for her breast cancer with no evidence of disease recurrence.  This is very favorable.  She is tolerating tamoxifen well and next year she will have the option of discontinuing or continuing an additional  5 years.  She has an excellent diet and exercise program but is not losing weight.  She would like a referral to nutrition and I am glad to provide that for her.  I gave her information on a probiotic diet and the current phase  3 COVID vaccine study available in [place]  She will return to see me in a year.  She knows to call for any other issue that may develop before then.  Magrinat, Virgie Dad, MD  03/04/19 2:22 PM Medical Oncology and Hematology [facility] [address] [place], [place] [address] Tel. [phone]    Fax. [phone]  I, Jacqualyn Posey am acting as a Education administrator for Chauncey Cruel, MD.   I, Lurline Del MD, have reviewed the above documentation for accuracy and completeness, and I agree with the above.

## 2019-03-04 ENCOUNTER — Inpatient Hospital Stay: Payer: 59 | Attending: Oncology

## 2019-03-04 ENCOUNTER — Other Ambulatory Visit: Payer: Self-pay

## 2019-03-04 ENCOUNTER — Inpatient Hospital Stay (HOSPITAL_BASED_OUTPATIENT_CLINIC_OR_DEPARTMENT_OTHER): Payer: 59 | Admitting: Oncology

## 2019-03-04 VITALS — BP 114/53 | HR 72 | Temp 99.1°F | Resp 18 | Ht 65.0 in | Wt 152.3 lb

## 2019-03-04 DIAGNOSIS — Z8371 Family history of colonic polyps: Secondary | ICD-10-CM | POA: Diagnosis not present

## 2019-03-04 DIAGNOSIS — M8580 Other specified disorders of bone density and structure, unspecified site: Secondary | ICD-10-CM | POA: Diagnosis not present

## 2019-03-04 DIAGNOSIS — Z808 Family history of malignant neoplasm of other organs or systems: Secondary | ICD-10-CM | POA: Insufficient documentation

## 2019-03-04 DIAGNOSIS — Z820 Family history of epilepsy and other diseases of the nervous system: Secondary | ICD-10-CM | POA: Insufficient documentation

## 2019-03-04 DIAGNOSIS — C50411 Malignant neoplasm of upper-outer quadrant of right female breast: Secondary | ICD-10-CM

## 2019-03-04 DIAGNOSIS — Z170 Estrogen receptor positive status [ER+]: Secondary | ICD-10-CM | POA: Insufficient documentation

## 2019-03-04 DIAGNOSIS — Z79810 Long term (current) use of selective estrogen receptor modulators (SERMs): Secondary | ICD-10-CM | POA: Insufficient documentation

## 2019-03-04 DIAGNOSIS — Z801 Family history of malignant neoplasm of trachea, bronchus and lung: Secondary | ICD-10-CM | POA: Diagnosis not present

## 2019-03-04 DIAGNOSIS — Z800 Family history of malignant neoplasm of digestive organs: Secondary | ICD-10-CM | POA: Diagnosis not present

## 2019-03-04 DIAGNOSIS — R232 Flushing: Secondary | ICD-10-CM | POA: Insufficient documentation

## 2019-03-04 DIAGNOSIS — Z803 Family history of malignant neoplasm of breast: Secondary | ICD-10-CM | POA: Diagnosis not present

## 2019-03-04 LAB — COMPREHENSIVE METABOLIC PANEL
ALT: 14 U/L (ref 0–44)
AST: 19 U/L (ref 15–41)
Albumin: 4 g/dL (ref 3.5–5.0)
Alkaline Phosphatase: 67 U/L (ref 38–126)
Anion gap: 8 (ref 5–15)
BUN: 17 mg/dL (ref 8–23)
CO2: 24 mmol/L (ref 22–32)
Calcium: 8.5 mg/dL — ABNORMAL LOW (ref 8.9–10.3)
Chloride: 107 mmol/L (ref 98–111)
Creatinine, Ser: 0.92 mg/dL (ref 0.44–1.00)
GFR calc Af Amer: >60 mL/min (ref >60)
GFR calc non Af Amer: >60 mL/min (ref >60)
Glucose, Bld: 91 mg/dL (ref 70–99)
Potassium: 4.5 mmol/L (ref 3.5–5.1)
Sodium: 139 mmol/L (ref 135–145)
Total Bilirubin: 0.4 mg/dL (ref 0.3–1.2)
Total Protein: 6.9 g/dL (ref 6.5–8.1)

## 2019-03-04 LAB — CBC WITH DIFFERENTIAL/PLATELET
Abs Immature Granulocytes: 0.01 10*3/uL (ref 0.00–0.07)
Basophils Absolute: 0.1 10*3/uL (ref 0.0–0.1)
Basophils Relative: 1 %
Eosinophils Absolute: 0.1 10*3/uL (ref 0.0–0.5)
Eosinophils Relative: 1 %
HCT: 37.2 % (ref 36.0–46.0)
Hemoglobin: 12.6 g/dL (ref 12.0–15.0)
Immature Granulocytes: 0 %
Lymphocytes Relative: 27 %
Lymphs Abs: 1.7 10*3/uL (ref 0.7–4.0)
MCH: 30.7 pg (ref 26.0–34.0)
MCHC: 33.9 g/dL (ref 30.0–36.0)
MCV: 90.5 fL (ref 80.0–100.0)
Monocytes Absolute: 0.4 10*3/uL (ref 0.1–1.0)
Monocytes Relative: 7 %
Neutro Abs: 4 10*3/uL (ref 1.7–7.7)
Neutrophils Relative %: 64 %
Platelets: 241 10*3/uL (ref 150–400)
RBC: 4.11 MIL/uL (ref 3.87–5.11)
RDW: 12.1 % (ref 11.5–15.5)
WBC: 6.3 10*3/uL (ref 4.0–10.5)
nRBC: 0 % (ref 0.0–0.2)

## 2019-03-05 ENCOUNTER — Telehealth: Payer: Self-pay | Admitting: Oncology

## 2019-03-05 NOTE — Telephone Encounter (Signed)
I talk with patient regarding schedule  

## 2019-03-09 ENCOUNTER — Other Ambulatory Visit: Payer: Self-pay | Admitting: *Deleted

## 2019-03-09 MED ORDER — TAMOXIFEN CITRATE 10 MG PO TABS: 10.0000 mg | ORAL_TABLET | Freq: Every day | ORAL | 4 refills | Status: DC

## 2019-03-11 ENCOUNTER — Other Ambulatory Visit: Payer: Self-pay | Admitting: *Deleted

## 2019-03-11 MED ORDER — TAMOXIFEN CITRATE 20 MG PO TABS: 20.0000 mg | ORAL_TABLET | Freq: Every day | ORAL | 4 refills | Status: DC

## 2019-03-16 ENCOUNTER — Other Ambulatory Visit: Payer: Self-pay

## 2019-03-16 ENCOUNTER — Inpatient Hospital Stay: Payer: 59 | Attending: Oncology

## 2019-03-16 NOTE — Progress Notes (Signed)
Nutrition Assessment   Reason for Assessment:  Referral as patient interested in weight loss   ASSESSMENT:  63 year old female with ER positive breast cancer.  Patient completed chemo and radiation in 2017.  Patient has been on tamoxifen.  Past medical history of SVT.    Met with patient in clinic this pm.  Patient interested in reaching a healthy weight.  Patient reports that she has incorporated whole grains, fruits, vegetables, lean proteins into her diet after finding out that she had cancer.  Reports that she eats organic foods.  Drinks water or sparkling water.  Does have wine on the weekend.  Reports that she does 1 hour cardio workout 5 days per week that she enjoys.    Nutrition Focused Physical Exam: deferred   Medications: reviewed   Labs: reviewed   Anthropometrics:   Height: 65 inches Weight: 152 lb 4.8 oz 8/20 Reports weighed 133 lb after chemotherapy.   BMI: 25  NUTRITION DIAGNOSIS: Food and nutrition related knowledge deficit related to weight management as evidenced by wanting to learn weight loss strategies   INTERVENTION:  Reviewed recommendations from [employer] for [employer] and provided website as resource for patient.  Discussed creating meal plan with [email or web address].   Encouraged patient to keep food diary for 2-3 days recording calorie intake and review for opportunities to cut calories.  Encouraged patient to continue exercise program.  Contact information provided If further information wanted regarding weight management would recommend [facility] to [facility].    MONITORING, EVALUATION, GOAL:  Patient will consume adequate calories and protein to reach healthy weight.     Next Visit: no follow-up at this time  Katrenia Alkins B. Zenia Resides, [place], [place] Registered Dietitian [phone] (pager)

## 2019-10-18 ENCOUNTER — Ambulatory Visit: Payer: 59 | Attending: Internal Medicine

## 2019-10-18 DIAGNOSIS — Z23 Encounter for immunization: Secondary | ICD-10-CM

## 2019-10-18 NOTE — Progress Notes (Signed)
   Covid-19 Vaccination Clinic  Name:  Catherine Lawson    MRN: [id number] DOB: 1956-03-04  10/18/2019  [name] was observed post Covid-19 immunization for 15 minutes without incident. She was provided with Vaccine Information Sheet and instruction to access the V-Safe system.   [name] was instructed to call 911 with any severe reactions post vaccine: Marland Kitchen Difficulty breathing  . Swelling of face and throat  . A fast heartbeat  . A bad rash all over body  . Dizziness and weakness   Immunizations Administered    Name Date Dose VIS Date Route   Pfizer COVID-19 Vaccine 10/18/2019  2:16 PM 0.3 mL 06/25/2019 Intramuscular   Manufacturer: [place]   Lot: [id number]   [place]: [id number]

## 2019-11-16 ENCOUNTER — Ambulatory Visit: Payer: 59 | Attending: Internal Medicine

## 2019-11-16 DIAGNOSIS — Z23 Encounter for immunization: Secondary | ICD-10-CM

## 2019-11-16 NOTE — Progress Notes (Signed)
   Covid-19 Vaccination Clinic  Name:  Catherine Lawson    MRN: [id number] DOB: 07/25/1955  11/16/2019  [name] was observed post Covid-19 immunization for 15 minutes without incident. She was provided with Vaccine Information Sheet and instruction to access the V-Safe system.   [name] was instructed to call 911 with any severe reactions post vaccine: Marland Kitchen Difficulty breathing  . Swelling of face and throat  . A fast heartbeat  . A bad rash all over body  . Dizziness and weakness   Immunizations Administered    Name Date Dose VIS Date Route   Pfizer COVID-19 Vaccine 11/16/2019  8:29 AM 0.3 mL 09/08/2018 Intramuscular   Manufacturer: [place]   Lot: [id number]   [place]: [id number]

## 2020-01-18 ENCOUNTER — Other Ambulatory Visit: Payer: Self-pay | Admitting: *Deleted

## 2020-01-18 DIAGNOSIS — M8580 Other specified disorders of bone density and structure, unspecified site: Secondary | ICD-10-CM

## 2020-01-18 DIAGNOSIS — C50411 Malignant neoplasm of upper-outer quadrant of right female breast: Secondary | ICD-10-CM

## 2020-01-18 DIAGNOSIS — Z9221 Personal history of antineoplastic chemotherapy: Secondary | ICD-10-CM

## 2020-03-05 NOTE — Progress Notes (Signed)
[place]  [phone] [phone]   ID: TESLA KEELER DOB: October 17, 1955  MR#: [id number]  QMV#:[id number]  Patient Care Team: Cari Caraway, MD as PCP - General (Family Medicine) Excell Seltzer, MD (Inactive) as Consulting Physician (General Surgery) Rilda Bulls, Virgie Dad, MD as Consulting Physician (Oncology) Kyung Rudd, MD as Consulting Physician (Radiation Oncology) Mauro Kaufmann, RN as Registered Nurse Rockwell Germany, RN as Registered Nurse Jake Shark, Johny Blamer, NP as Nurse Practitioner (Nurse Practitioner) Vania Rea, MD as Consulting Physician (Obstetrics and Gynecology) Thompson Grayer, MD as Consulting Physician (Cardiology) OTHER MD:  CHIEF COMPLAINT: Estrogen receptor positive breast cancer  CURRENT TREATMENT:  tamoxifen   INTERVAL HISTORY: Catherine Lawson returns today for follow-up of her estrogen receptor positive breast cancer.   She continues on tamoxifen.  She has no significant hot flashes or vaginal wetness issues..  Since her last visit, she underwent bilateral diagnostic mammography with tomography at [facility] on 02/22/2020 showing: breast density category B; no evidence of malignancy in either breast.   She also had a bone density on the same day showing a T score of -1.5, which is mildly osteopenic  REVIEW OF SYSTEMS.  Catherine Lawson has had both Pfizer vaccine doses which she tolerated generally well although the second dose did cause some flulike symptoms.  She has a great exercise program 3 or 4 days a week at least doing cardio and strength training and cycling.  [facility] is [facility]  Aside from these issues a detailed review of systems today was stable   BREAST CANCER HISTORY: From the original intake note:     "Catherine Lawson" had routine screening mammography at [facility] 01/24/2015. This showed a 1.6 cm mass in the right breast at the 11:00 position, and on 0.8 cm irregular density also at the 11:00  position farther away from the nipple. On 02/01/2015 she underwent a unilateral right diagnostic mammography at [facility]. The breast density was category B. In addition to the 2 masses previously noted there was a cluster of calcifications at the 9:00 position. Ultrasonography the same day showed a 1.9 cm oval mass, which was hypoechoic with no vascularity and hard on L a Stogner if he, a 7 mm mass at the 10:00 position with no vascularity and intermediate L a Stogner 3, and a lymph node with uniform cortex thickening in the right axilla. The 2 masses in question were separated by 2 cm.  On 02/02/2015 the patient underwent biopsy of what appears to have been the larger of the 2 breast masses (I cannot locate the biopsy report). This documented invasive ductal carcinoma, grade 2 or 3, with micropapillary features, estrogen and progesterone receptor positive, with an MIB-1 of 40%, and HER-2 not amplified with a signals ratio of 1.36 and a number per cell of 3.60. There was evidence of lymphovascular invasion.  Because of bleeding from the first biopsy, the second breast mass could not be performed. The lymph node was aspirated, not cord, and this appeared benign but the concordance is questionable.  The patient's subsequent history is as detailed below   PAST MEDICAL HISTORY: Past Medical History:  Diagnosis Date   Allergy    Arthritis    bil thumb joints   Breast cancer ([place]) 06/2015   Breast cancer of upper-outer quadrant of right female breast ([place]) 02/06/2015   sx ,chemo,radiation   Family history of breast cancer    Family history of colon cancer    Family history  of stomach cancer    SVT (supraventricular tachycardia) (HCC)    fast HR at times due to stress, no chest pain or SOB    PAST SURGICAL HISTORY: Past Surgical History:  Procedure Laterality Date   COLONOSCOPY     ELECTROPHYSIOLOGIC STUDY N/A 11/28/2015   left lateral accessory pathway and slow pathway ablation performed  by Dr Rayann Heman   POLYPECTOMY     PORTACATH PLACEMENT Right 06/27/2015   Procedure: INSERTION PORT-A-CATH;  Surgeon: Excell Seltzer, MD;  Location: [place];  Service: General;  Laterality: Right;   RADIOACTIVE SEED GUIDED PARTIAL MASTECTOMY WITH AXILLARY SENTINEL LYMPH NODE BIOPSY Right 05/29/2015   Procedure: RADIOACTIVE SEED RIGHT BREAST LUMPECTOMY WITH AXILLARY SENTINEL LYMPH NODE BIOPSY;  Surgeon: Excell Seltzer, MD;  Location: [place];  Service: General;  Laterality: Right;   RE-EXCISION OF BREAST LUMPECTOMY Right 06/12/2015   Procedure: RE-EXCISION OF RIGHT  BREAST LUMPECTOMY;  Surgeon: Excell Seltzer, MD;  Location: [facility];  Service: General;  Laterality: Right;   WISDOM TOOTH EXTRACTION      FAMILY HISTORY Family History  Problem Relation Age of Onset   Breast cancer Sister 25   Colon cancer Father 53   Cancer Maternal Grandmother        probable ovarian cancer   Stomach cancer Paternal Grandmother 44   Brain cancer Other 10       Medulloblastoma   Parkinson's disease Mother    Lung cancer Paternal Uncle        smoker   Stomach cancer Other        PGMs sister   Stomach cancer Other        PMGs brother   Colon polyps Brother    Colon polyps Sister    Esophageal cancer Neg Hx    Rectal cancer Neg Hx    the patient's father is still living, at age 98. The patient's mother died from Parkinson's disease complications at age 63. The patient had 5 brothers, 4 sisters. One sister was diagnosed with breast cancer at age 51 and died at age 43. The patient's father was diagnosed with colon cancer at the age of 33. A nephew was diagnosed with medulloblastoma at age 54 and died at age 60. The paternal grandmother was diagnosed with stomach cancer at age 62. The maternal grandmother was diagnosed with metastatic cancer of unknown type at age 92. (The description is suggestive of an ovarian cancer).   GYNECOLOGIC HISTORY:  No  LMP recorded. Patient is postmenopausal. Menarche age 41. The patient is GX P0. She stopped having periods in 2014. She did not take hormone replacement. She took oral contraceptives for approximately 20 years with no complications.   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 Her husband [name] works for a Merchand[employer], retail in [employer] "[employer] credited Warehouse manager". [name] has 2 daughters from an earlier marriage, Ronalee Belts lives in [place] and works in recruiting, and Eleonore Shippee lives in [place] and works in Press photographer. They have 1 grandchild. The patient attends the [employer]   HEALTH MAINTENANCE: Social History   Tobacco Use   Smoking status: Never Smoker   Smokeless tobacco: Never Used  Vaping Use   Vaping Use: Never used  Substance Use Topics   Alcohol use: Yes    Alcohol/week: 4.0 - 5.0 standard drinks    Types: 4 - 5 Glasses of wine  per week    Comment: social   Drug use: No     Colonoscopy: 2014/lobe are  PAP: 2015  Bone density: 02/12/2018 showed a T score of -2.1  Lipid panel:  No Known Allergies  Current Outpatient Medications  Medication Sig Dispense Refill   calcium carbonate (TUMS - DOSED IN MG ELEMENTAL CALCIUM) 500 MG chewable tablet Chew 1 tablet by mouth daily.     cetirizine (ZYRTEC) 10 MG tablet Take 10 mg by mouth daily.     Crisaborole (EUCRISA) 2 % OINT Eucrisa 2 % topical ointment     fluocinolone (VANOS) 0.01 % cream Apply topically 2 (two) times daily.     Multiple Vitamin (MULTIVITAMIN) tablet Take 1 tablet by mouth daily.     Omega-3 Fatty Acids (FISH OIL OMEGA-3 PO) Take 2 tablets by mouth daily.     Probiotic Product (PROBIOTIC PO) Take by mouth.     tamoxifen (NOLVADEX) 20 MG tablet Take 1 tablet (20 mg total) by mouth daily. 90 tablet 4   Current Facility-Administered Medications  Medication Dose Route Frequency Provider Last  Rate Last Admin   0.9 %  sodium chloride infusion  500 mL Intravenous Once Ladene Artist, MD        OBJECTIVE: White woman who appears younger than stated age  20:   03/06/20 1058  BP: 139/61  Pulse: 64  Resp: 20  Temp: (!) 97.5 F (36.4 C)  SpO2: 100%    Body mass index is 24.66 kg/m.     ECOG FS:1 - Symptomatic but completely ambulatory  Sclerae unicteric, EOMs intact Wearing a mask No cervical or supraclavicular adenopathy Lungs no rales or rhonchi Heart regular rate and rhythm Abd soft, nontender, positive bowel sounds MSK no focal spinal tenderness, no upper extremity lymphedema Neuro: nonfocal, well oriented, appropriate affect Breasts: The right breast is status post lumpectomy and radiation.  There is no evidence of local recurrence.  Both axillae are benign.  The left breast is benign.   LAB RESULTS:  CMP     Component Value Date/Time   NA 138 03/06/2020 1026   NA 140 01/28/2017 1312   K 4.5 03/06/2020 1026   K 5.2 (H) 01/28/2017 1312   CL 107 03/06/2020 1026   CO2 23 03/06/2020 1026   CO2 24 01/28/2017 1312   GLUCOSE 90 03/06/2020 1026   GLUCOSE 91 01/28/2017 1312   BUN 12 03/06/2020 1026   BUN 17.3 01/28/2017 1312   CREATININE 0.87 03/06/2020 1026   CREATININE 0.8 01/28/2017 1312   CALCIUM 9.1 03/06/2020 1026   CALCIUM 9.7 01/28/2017 1312   PROT 6.9 03/06/2020 1026   PROT 6.9 01/28/2017 1312   ALBUMIN 3.7 03/06/2020 1026   ALBUMIN 3.9 01/28/2017 1312   AST 18 03/06/2020 1026   AST 24 01/28/2017 1312   ALT 13 03/06/2020 1026   ALT 20 01/28/2017 1312   ALKPHOS 70 03/06/2020 1026   ALKPHOS 86 01/28/2017 1312   BILITOT 0.5 03/06/2020 1026   BILITOT 0.56 01/28/2017 1312   GFRNONAA >60 03/06/2020 1026   GFRAA >60 03/06/2020 1026    INo results found for: SPEP, UPEP  Lab Results  Component Value Date   WBC 5.5 03/06/2020   NEUTROABS 2.9 03/06/2020   HGB 12.4 03/06/2020   HCT 37.7 03/06/2020   MCV 88.9 03/06/2020   PLT 234  03/06/2020      Chemistry      Component Value Date/Time   NA 138 03/06/2020 1026  NA 140 01/28/2017 1312   K 4.5 03/06/2020 1026   K 5.2 (H) 01/28/2017 1312   CL 107 03/06/2020 1026   CO2 23 03/06/2020 1026   CO2 24 01/28/2017 1312   BUN 12 03/06/2020 1026   BUN 17.3 01/28/2017 1312   CREATININE 0.87 03/06/2020 1026   CREATININE 0.8 01/28/2017 1312      Component Value Date/Time   CALCIUM 9.1 03/06/2020 1026   CALCIUM 9.7 01/28/2017 1312   ALKPHOS 70 03/06/2020 1026   ALKPHOS 86 01/28/2017 1312   AST 18 03/06/2020 1026   AST 24 01/28/2017 1312   ALT 13 03/06/2020 1026   ALT 20 01/28/2017 1312   BILITOT 0.5 03/06/2020 1026   BILITOT 0.56 01/28/2017 1312       No results found for: LABCA2  No components found for: LABCA125  No results for input(s): INR in the last 168 hours.  Urinalysis No results found for: COLORURINE, APPEARANCEUR, LABSPEC, PHURINE, GLUCOSEU, HGBUR, BILIRUBINUR, KETONESUR, PROTEINUR, UROBILINOGEN, NITRITE, LEUKOCYTESUR   STUDIES: No results found.   ASSESSMENT: 64 y.o. [place] woman status post right breast upper outer quadrant biopsy 02/02/2015 for a clinically multifocal T1c NX, stage IA invasive ductal carcinoma, grade 2 or 3, estrogen and progesterone receptor positive, with HER-2 not amplified and the MIB-1 at 40%  (1) biopsy of a suspicious left breast mass 03/28/2015 showed only usual ductal hyperplasia, no malignancy identified.  (2) genetics testing 03/06/2015 through the Breast/Ovarian gene panel offered by [facility] found no deleterious mutations in ATM, BARD1, BRCA1, BRCA2, BRIP1, CDH1, CHEK2, EPCAM, FANCC, MLH1, MSH2, MSH6, NBN, PALB2, PMS2, PTEN, RAD51C, RAD51D, TP53, and XRCC2.  (a) there was a Variant of Unknown Significance in the BRCA2 gene called [id number]  (3) status post right lumpectomy and sentinel lymph node sampling 05/29/2015 for an mpT1b pN0, stage IA invasive ductal carcinoma, grade 2, with positive margins  (a)  margins cleared with subsequent surgery 06/12/2015  (3) Oncotype score of 35 predicts a risk of outside the breast recurrence within 10 years of 24% if the patient's only systemic therapy is tamoxifen for 5 years. It also predicts significant benefit from chemotherapy.  (4) adjuvant chemotherapy consisting of cyclophosphamide and docetaxel 4, given 3 weeks apart, starting 06/27/2015, completed 08/30/2015  (5) FISH panel returned HER-2 positive. Receive trastuzumab starting 08/09/2015, last dose 07/05/2016  (a) echocardiogram 05/28/2016 showed a well-preserved ejection fraction at 60-65%.  (6) adjuvant radiation 09/26/2015 through 11/10/2015: Site/dose:   The patient initially received a dose of 50.4 Gy in 28 fractions to the breast using whole-breast tangent fields. This was delivered using a 3-D conformal technique. The patient then received a boost to the seroma. This delivered an additional 10 Gy in 5 fractions using a electron field. The total dose was 60.4 Gy.  (7) received anastrozole 02/08/2015 through December 2016, resumed at the completion of radiation April 2017  (a) D[facility]A scan [facility] 03/28/2015 showed osteopenia, with a T score of -2.1  (b) bone density 02/12/2018 showed a T-score of -2.1  (c) stopped anastrozole 01/11/2018  (d) repeat bone density August 2021 showed a T score of -1.5  (8) started tamoxifen August 2019   PLAN: Catherine Lawson is now nearly 5 years out from definitive surgery for breast cancer with no evidence of disease recurrence.  This is very favorable.  We are counting her 5 years of antiestrogens from the time that she resumed anastrozole in April 2017.  Accordingly she will have the option of going off antiestrogens when she  sees me again a year from now.  Alternatively she can continue tamoxifen a little longer since it is clearly benefiting her bone density and she is tolerating it quite well.  It also would give her a couple of extra percentage points in risk  reduction as far as breast cancer recurrence is concerned.  She is reading about cancer stem cells.  We discussed that today.  I referred her to pub med and if she can get into the vocabulary she can learn quite a bit about breast stem cells and the research surrounding them  Otherwise she will see me again in a year.  She knows to call for any other issues that may develop before then  Total encounter time 25 minutes.*  Cleopha Indelicato, Virgie Dad, MD  03/06/20 11:26 AM Medical Oncology and Hematology [facility] [place], [place] [address] Tel. [phone]    Fax. [phone]   I, Wilburn Mylar, am acting as scribe for Dr. Virgie Dad. Allea Kassner.  I, Lurline Del MD, have reviewed the above documentation for accuracy and completeness, and I agree with the above.    *Total Encounter Time as defined by the Centers for Medicare and Medicaid Services includes, in addition to the face-to-face time of a patient visit (documented in the note above) non-face-to-face time: obtaining and reviewing outside history, ordering and reviewing medications, tests or procedures, care coordination (communications with other health care professionals or caregivers) and documentation in the medical record.

## 2020-03-06 ENCOUNTER — Inpatient Hospital Stay (HOSPITAL_BASED_OUTPATIENT_CLINIC_OR_DEPARTMENT_OTHER): Payer: 59 | Admitting: Oncology

## 2020-03-06 ENCOUNTER — Other Ambulatory Visit: Payer: Self-pay

## 2020-03-06 ENCOUNTER — Inpatient Hospital Stay: Payer: 59 | Attending: Oncology

## 2020-03-06 VITALS — BP 139/61 | HR 64 | Temp 97.5°F | Resp 20 | Wt 148.2 lb

## 2020-03-06 DIAGNOSIS — Z79899 Other long term (current) drug therapy: Secondary | ICD-10-CM | POA: Insufficient documentation

## 2020-03-06 DIAGNOSIS — Z923 Personal history of irradiation: Secondary | ICD-10-CM | POA: Insufficient documentation

## 2020-03-06 DIAGNOSIS — Z808 Family history of malignant neoplasm of other organs or systems: Secondary | ICD-10-CM | POA: Insufficient documentation

## 2020-03-06 DIAGNOSIS — Z170 Estrogen receptor positive status [ER+]: Secondary | ICD-10-CM | POA: Insufficient documentation

## 2020-03-06 DIAGNOSIS — C50411 Malignant neoplasm of upper-outer quadrant of right female breast: Secondary | ICD-10-CM

## 2020-03-06 DIAGNOSIS — M8580 Other specified disorders of bone density and structure, unspecified site: Secondary | ICD-10-CM | POA: Diagnosis not present

## 2020-03-06 DIAGNOSIS — Z79811 Long term (current) use of aromatase inhibitors: Secondary | ICD-10-CM | POA: Diagnosis not present

## 2020-03-06 DIAGNOSIS — Z8371 Family history of colonic polyps: Secondary | ICD-10-CM | POA: Diagnosis not present

## 2020-03-06 DIAGNOSIS — Z801 Family history of malignant neoplasm of trachea, bronchus and lung: Secondary | ICD-10-CM | POA: Insufficient documentation

## 2020-03-06 DIAGNOSIS — Z79810 Long term (current) use of selective estrogen receptor modulators (SERMs): Secondary | ICD-10-CM | POA: Diagnosis not present

## 2020-03-06 DIAGNOSIS — Z818 Family history of other mental and behavioral disorders: Secondary | ICD-10-CM | POA: Diagnosis not present

## 2020-03-06 DIAGNOSIS — N6320 Unspecified lump in the left breast, unspecified quadrant: Secondary | ICD-10-CM | POA: Insufficient documentation

## 2020-03-06 DIAGNOSIS — Z803 Family history of malignant neoplasm of breast: Secondary | ICD-10-CM | POA: Insufficient documentation

## 2020-03-06 DIAGNOSIS — Z800 Family history of malignant neoplasm of digestive organs: Secondary | ICD-10-CM | POA: Diagnosis not present

## 2020-03-06 LAB — COMPREHENSIVE METABOLIC PANEL
ALT: 13 U/L (ref 0–44)
AST: 18 U/L (ref 15–41)
Albumin: 3.7 g/dL (ref 3.5–5.0)
Alkaline Phosphatase: 70 U/L (ref 38–126)
Anion gap: 8 (ref 5–15)
BUN: 12 mg/dL (ref 8–23)
CO2: 23 mmol/L (ref 22–32)
Calcium: 9.1 mg/dL (ref 8.9–10.3)
Chloride: 107 mmol/L (ref 98–111)
Creatinine, Ser: 0.87 mg/dL (ref 0.44–1.00)
GFR calc Af Amer: >60 mL/min (ref >60)
GFR calc non Af Amer: >60 mL/min (ref >60)
Glucose, Bld: 90 mg/dL (ref 70–99)
Potassium: 4.5 mmol/L (ref 3.5–5.1)
Sodium: 138 mmol/L (ref 135–145)
Total Bilirubin: 0.5 mg/dL (ref 0.3–1.2)
Total Protein: 6.9 g/dL (ref 6.5–8.1)

## 2020-03-06 LAB — CBC WITH DIFFERENTIAL/PLATELET
Abs Immature Granulocytes: 0.02 10*3/uL (ref 0.00–0.07)
Basophils Absolute: 0 10*3/uL (ref 0.0–0.1)
Basophils Relative: 1 %
Eosinophils Absolute: 0.2 10*3/uL (ref 0.0–0.5)
Eosinophils Relative: 3 %
HCT: 37.7 % (ref 36.0–46.0)
Hemoglobin: 12.4 g/dL (ref 12.0–15.0)
Immature Granulocytes: 0 %
Lymphocytes Relative: 33 %
Lymphs Abs: 1.8 10*3/uL (ref 0.7–4.0)
MCH: 29.2 pg (ref 26.0–34.0)
MCHC: 32.9 g/dL (ref 30.0–36.0)
MCV: 88.9 fL (ref 80.0–100.0)
Monocytes Absolute: 0.5 10*3/uL (ref 0.1–1.0)
Monocytes Relative: 10 %
Neutro Abs: 2.9 10*3/uL (ref 1.7–7.7)
Neutrophils Relative %: 53 %
Platelets: 234 10*3/uL (ref 150–400)
RBC: 4.24 MIL/uL (ref 3.87–5.11)
RDW: 13.2 % (ref 11.5–15.5)
WBC: 5.5 10*3/uL (ref 4.0–10.5)
nRBC: 0 % (ref 0.0–0.2)

## 2020-03-07 ENCOUNTER — Other Ambulatory Visit: Payer: Self-pay | Admitting: *Deleted

## 2020-03-07 MED ORDER — TAMOXIFEN CITRATE 20 MG PO TABS: 20.0000 mg | ORAL_TABLET | Freq: Every day | ORAL | 4 refills | Status: DC

## 2020-03-27 ENCOUNTER — Other Ambulatory Visit: Payer: Self-pay | Admitting: Obstetrics & Gynecology

## 2020-03-27 ENCOUNTER — Other Ambulatory Visit (HOSPITAL_COMMUNITY): Payer: Self-pay | Admitting: Obstetrics & Gynecology

## 2020-03-27 ENCOUNTER — Other Ambulatory Visit: Payer: Self-pay | Admitting: Oncology

## 2020-03-27 DIAGNOSIS — N950 Postmenopausal bleeding: Secondary | ICD-10-CM

## 2020-04-04 ENCOUNTER — Ambulatory Visit (HOSPITAL_COMMUNITY): Payer: PRIVATE HEALTH INSURANCE

## 2020-04-04 ENCOUNTER — Encounter (HOSPITAL_COMMUNITY): Payer: Self-pay

## 2020-04-05 ENCOUNTER — Ambulatory Visit (HOSPITAL_COMMUNITY): Payer: 59

## 2020-04-10 ENCOUNTER — Ambulatory Visit (HOSPITAL_COMMUNITY)
Admission: RE | Admit: 2020-04-10 | Discharge: 2020-04-10 | Disposition: A | Discharge status: Home / Self Care | Payer: 59 | Source: Ambulatory Visit | Attending: Obstetrics & Gynecology | Admitting: Obstetrics & Gynecology

## 2020-04-10 ENCOUNTER — Other Ambulatory Visit: Payer: Self-pay

## 2020-04-10 DIAGNOSIS — N950 Postmenopausal bleeding: Secondary | ICD-10-CM | POA: Diagnosis not present

## 2020-07-05 ENCOUNTER — Telehealth: Payer: Self-pay

## 2020-07-05 NOTE — Telephone Encounter (Signed)
Offered Ms Tingley an appointment for tomorrow.  She just had her covid booster today and is feeling achy and does not know how she will feel. Offered 07-13-20. Pt is having family in for the holidays at that time.  She preferred the beginning of the year. January 4,2022 at 7 worked for her scheduled.  Reviewed new patient instructions.

## 2020-07-17 ENCOUNTER — Telehealth: Payer: Self-pay

## 2020-07-17 ENCOUNTER — Encounter: Payer: Self-pay | Admitting: Gynecologic Oncology

## 2020-07-17 NOTE — Telephone Encounter (Signed)
TC to patient to review meaningful use.  No answer, left message to return call.    

## 2020-07-18 ENCOUNTER — Other Ambulatory Visit: Payer: Self-pay

## 2020-07-18 ENCOUNTER — Inpatient Hospital Stay: Payer: 59 | Attending: Gynecologic Oncology | Admitting: Gynecologic Oncology

## 2020-07-18 ENCOUNTER — Telehealth: Payer: Self-pay

## 2020-07-18 ENCOUNTER — Encounter: Payer: Self-pay | Admitting: Gynecologic Oncology

## 2020-07-18 VITALS — BP 128/64 | HR 78 | Temp 97.7°F | Resp 16 | Ht 65.0 in | Wt 150.6 lb

## 2020-07-18 DIAGNOSIS — R9389 Abnormal findings on diagnostic imaging of other specified body structures: Secondary | ICD-10-CM | POA: Insufficient documentation

## 2020-07-18 DIAGNOSIS — Z79810 Long term (current) use of selective estrogen receptor modulators (SERMs): Secondary | ICD-10-CM | POA: Diagnosis not present

## 2020-07-18 DIAGNOSIS — C50919 Malignant neoplasm of unspecified site of unspecified female breast: Secondary | ICD-10-CM | POA: Insufficient documentation

## 2020-07-18 DIAGNOSIS — N950 Postmenopausal bleeding: Secondary | ICD-10-CM | POA: Diagnosis not present

## 2020-07-18 DIAGNOSIS — Z170 Estrogen receptor positive status [ER+]: Secondary | ICD-10-CM | POA: Diagnosis not present

## 2020-07-18 NOTE — Progress Notes (Signed)
Consult Note: Gyn-Onc  Consult was requested by Dr. Stann Mainland for the evaluation of Catherine Lawson Lawson 65 y.o. female  CC:  Chief Complaint  Patient presents with  . Post-menopausal bleeding    Second Opinion    Assessment/Plan:  Catherine Lawson. Catherine Lawson Lawson  is a 65 y.o.  year old with a thickened endometrium, postmenopausal bleeding and cervical stenosis with failed attempts at office sampling. She has a hx of ER/PR positive breast cancer and is on Tamoxifen. She has a BRCA2 germline mutation of undetermined clinical signficance.   I offered Catherine Lawson either proceeding with repeat D&C in the operating room under GA with US guidance.  I explained that this may be associated with failed entry of false passage into the uterus in which case will be in the same situation as we are now.  However if this yields successful sampling of the endometrial cavity, and that sampling is both concordant with the ultrasound findings and benign, she may avoid a more radical surgery such as hysterectomy.  Alternatively if malignancy is premalignancy is identified we will be able to proceed promptly with surgical staging with hysterectomy and sentinel lymph node biopsy.  The other option would be to proceed immediately with hysterectomy.  I feel that she is a good surgical risk with normal body weight, minimal medical comorbidities, no prior abdominal surgeries.  I feel that she would be a low risk candidate for hysterectomy.  I explained that all surgeries contain some risk which include  bleeding, infection, damage to internal organs (such as bladder,ureters, bowels), blood clot, reoperation and rehospitalization.  We would be relying on frozen section pathology to determine if surgical staging is necessary, and explained to the patient that this is limited compared to definitive preoperative pathology from endometrial sampling.  I explained that robotic hysterectomy is typically an outpatient procedure.  I explained that she would  have some convalescence and explained what this would likely involve.  After hearing her 2 options she is favoring the more conservative approach with repeated attempts at endometrial sampling, this time in the operating room under ultrasound guidance.  She'll be scheduled for a D&C under ultrasound guidance later in January.  I will see her back postoperatively discussed the pathology.  We will discuss with genetics if she needs to have updated assessment given that her last testing was in 2016 and more baby known about her BRCA2 variant.  If it is determined that her BRCA2 variant is associated with the known increased risk for ovarian cancer, I would strongly recommend that the patient proceed with risk reducing hysterectomy and BSO.  There is a potential some slightly increased risk for serous endometrial cancer associated with BRCA2 germline mutation in addition to the significantly increased risk for ovarian cancer.  HPI: Catherine Lawson Lawson is a 65 year old P0 who was seen in consultation at the request of Dr Stann Mainland for evaluation of postmenopausal bleeding and thickened endometrium after failed office sampling attempts.  The patient has a history of ER/PR positive breast cancer diagnosed in 2016 in the right breast.  This was stage I and treated with chemotherapy, lumpectomy, and postoperative aromatase inhibitor followed by tamoxifen due to intolerance.  She had planned duration of tamoxifen therapy until 2023.  She developed symptoms of a single episode of postmenopausal bleeding in September 2021.  She visited her gynecologist Dr. Stann Mainland for a symptom directed visit at this time.  Of note the patient reported a remote history of cervical dysplasia in 1997  treated with cryotherapy.  She is known to have had normal Pap tests since that time with negative high-risk HPV.  On April 10, 2020 when she was seen by Dr. Stann Mainland for this new symptom of postmenopausal bleeding, a transvaginal ultrasound scan  was performed in the office on that day, which revealed a uterus measuring 7.7 x 4.1 x 5.8 cm.  There was a separate partially exophytic myometrial mass measuring about 4 cm which was likely a fibroid.  The endometrium was thickened at 18 mm.  The ovaries were grossly normal bilaterally and well visualized.  There was no free fluid.  Office endometrial Pipelle sampling was attempted the same day and revealed only scant atrophic endometrium and minute fragments of benign endocervical glands due to cervical stenosis.  She was referred to Dr. Gwynne Edinger partner for office hysteroscopy and D&C which was attempted on June 26, 2020.  She had been prescribed and administered measle processed all 4 hours preoperatively.  Cervical stenosis prevented adequate entry into the endometrial cavity.  Endometrial curettage was performed blindly which revealed minute fragments of benign endocervical glands.  Her medical history is most significant for breast cancer (see above), SVT treated with ablation.  Her surgical history is most significant for cardiac ablation and oral surgery.  Her gynecologic history is remarkable for no prior vaginal births or pregnancies, remote history of cervical dysplasia treated with cryotherapy.  Her family cancer history is significant for sister who died from breast cancer at age 51.  She has another sister who was diagnosed with DCIS.  Maternal grandmother died in her 24s from a disseminated abdominal cancer that was not treated.  Her paternal grandmother died from stomach cancer at age 101.  Her father was diagnosed with colon cancer at age 71.  Her brothers son (her nephew) died of medulloblastoma at age 13.  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 She lives with her husband who is of good health.   Current Meds:  Outpatient Encounter Medications as of 07/18/2020  Medication Sig  . calcium carbonate (TUMS - DOSED IN MG  ELEMENTAL CALCIUM) 500 MG chewable tablet Chew 1 tablet by mouth daily.  . cetirizine (ZYRTEC) 10 MG tablet Take 10 mg by mouth daily.  . Crisaborole 2 % OINT Eucrisa 2 % topical ointment  . fluocinolone (VANOS) 0.01 % cream Apply topically 2 (two) times daily.  . Multiple Vitamin (MULTIVITAMIN) tablet Take 1 tablet by mouth daily.  . Omega-3 Fatty Acids (FISH OIL TRIPLE STRENGTH) 1400 MG CAPS Take 1,400 mg by mouth daily.  . Probiotic Product (PROBIOTIC PO) Take by mouth.  . tamoxifen (NOLVADEX) 20 MG tablet TAKE 1 TABLET BY MOUTH EVERY DAY  . [DISCONTINUED] Omega-3 Fatty Acids (FISH OIL OMEGA-3 PO) Take 2 tablets by mouth daily.   Facility-Administered Encounter Medications as of 07/18/2020  Medication  . 0.9 %  sodium chloride infusion    Allergy:  Allergies  Allergen Reactions  . Other Itching and Dermatitis    Seasonal Allergies    Social Hx:   Social History   Socioeconomic History  . Marital status: Married    Spouse name: Event organiser  . Number of children: 0  . Years of education: Not on file  . Highest education level: Not on file  Occupational History  . Not on file  Tobacco Use  . Smoking status: Never Smoker  . Smokeless tobacco: Never Used  Vaping Use  . Vaping Use:  Never used  Substance and Sexual Activity  . Alcohol use: Yes    Alcohol/week: 4.0 - 5.0 standard drinks    Types: 4 - 5 Glasses of wine per week    Comment: social  . Drug use: No  . Sexual activity: Not Currently  Other Topics Concern  . Not on file  Social History Narrative   Lives in [place]   Retired   married   Social Determinants of Health   Financial Resource Strain: Not on file  Food Insecurity: Not on file  Transportation Needs: Not on file  Physical Activity: Not on file  Stress: Not on file  Social Connections: Not on file  Intimate Partner Violence: Not on file    Past Surgical Hx:  Past Surgical History:  Procedure Laterality Date  . COLONOSCOPY    .  ELECTROPHYSIOLOGIC STUDY N/A 11/28/2015   left lateral accessory pathway and slow pathway ablation performed by Dr Rayann Heman  . POLYPECTOMY    . PORTACATH PLACEMENT Right 06/27/2015   Procedure: INSERTION PORT-A-CATH;  Surgeon: Excell Seltzer, MD;  Location: [facility];  Service: General;  Laterality: Right;  . RADIOACTIVE SEED GUIDED PARTIAL MASTECTOMY WITH AXILLARY SENTINEL LYMPH NODE BIOPSY Right 05/29/2015   Procedure: RADIOACTIVE SEED RIGHT BREAST LUMPECTOMY WITH AXILLARY SENTINEL LYMPH NODE BIOPSY;  Surgeon: Excell Seltzer, MD;  Location: [facility];  Service: General;  Laterality: Right;  . RE-EXCISION OF BREAST LUMPECTOMY Right 06/12/2015   Procedure: RE-EXCISION OF RIGHT  BREAST LUMPECTOMY;  Surgeon: Excell Seltzer, MD;  Location: [facility];  Service: General;  Laterality: Right;  . WISDOM TOOTH EXTRACTION      Past Medical Hx:  Past Medical History:  Diagnosis Date  . Allergy   . Arthritis    bil thumb joints  . Breast cancer ([facility]) 06/2015  . Breast cancer of upper-outer quadrant of right female breast ([facility]) 02/06/2015   sx ,chemo,radiation  . Family history of breast cancer   . Family history of colon cancer   . Family history of stomach cancer   . SVT (supraventricular tachycardia) (HCC)    fast HR at times due to stress, no chest pain or SOB    Past Gynecological History:  See HPI No LMP recorded. Patient is postmenopausal.  Family Hx:  Family History  Problem Relation Age of Onset  . Breast cancer Sister 77  . Colon cancer Father 36  . Cancer Maternal Grandmother        probable ovarian cancer  . Stomach cancer Paternal Grandmother 32  . Brain cancer Other 10       Medulloblastoma  . Parkinson's disease Mother   . Lung cancer Paternal Uncle        smoker  . Stomach cancer Other        PGMs sister  . Stomach cancer Other        PMGs brother  . Colon polyps Brother   . Colon polyps Sister   . Esophageal cancer Neg Hx   .  Rectal cancer Neg Hx   . Uterine cancer Neg Hx     Review of Systems:  Constitutional  Feels well,    ENT Normal appearing ears and nares bilaterally Skin/Breast  No rash, sores, jaundice, itching, dryness Cardiovascular  No chest pain, shortness of breath, or edema  Pulmonary  No cough or wheeze.  Gastro Intestinal  No nausea, vomitting, or diarrhoea. No bright red blood per rectum, no abdominal pain, change in bowel movement, or  constipation.  Genito Urinary  No frequency, urgency, dysuria, + postmenopausal bleeding Musculo Skeletal  No myalgia, arthralgia, joint swelling or pain  Neurologic  No weakness, numbness, change in gait,  Psychology  No depression, anxiety, insomnia.   Vitals:  Blood pressure 128/64, pulse 78, temperature 97.7 F (36.5 C), temperature source Tympanic, resp. rate 16, height 5' 5"  (1.651 m), weight 150 lb 9.6 oz (68.3 kg), SpO2 99 %.  Physical Exam: WD in NAD Neck  Supple NROM, without any enlargements.  Lymph Node Survey No cervical supraclavicular or inguinal adenopathy Cardiovascular  Pulse normal rate, regularity and rhythm. S1 and S2 normal.  Lungs  Clear to auscultation bilateraly, without wheezes/crackles/rhonchi. Good air movement.  Skin  No rash/lesions/breakdown  Psychiatry  Alert and oriented to person, place, and time  Abdomen  Normoactive bowel sounds, abdomen soft, non-tender and thin without evidence of hernia.  Back No CVA tenderness Genito Urinary  Vulva/vagina: Normal external female genitalia.  No lesions. No discharge or bleeding.  Bladder/urethra:  No lesions or masses, well supported bladder  Vagina: normal  Cervix: Small, flush with upper vagina, normal appearing, no lesions.  Uterus: Small, mobile, no parametrial involvement or nodularity.  Adnexa: no palpable masses. Rectal  deferred Extremities  No bilateral cyanosis, clubbing or edema.  60 minutes of total time was spent for this patient encounter,  including preparation, face-to-face counseling with the patient and coordination of care, review of imaging (results and images), communication with the referring provider and documentation of the encounter.   Thereasa Solo, MD  07/18/2020, 12:09 PM

## 2020-07-18 NOTE — H&P (View-Only) (Signed)
Consult Note: Gyn-Onc  Consult was requested by Dr. Stann Mainland for the evaluation of Catherine Lawson 65 y.o. female  CC:  Chief Complaint  Patient presents with  . Post-menopausal bleeding    Second Opinion    Assessment/Plan:  Catherine Lawson  is a 65 y.o.  year old with a thickened endometrium, postmenopausal bleeding and cervical stenosis with failed attempts at office sampling. She has a hx of ER/PR positive breast cancer and is on Tamoxifen. She has a BRCA2 germline mutation of undetermined clinical signficance.   I offered Catherine Lawson either proceeding with repeat D&C in the operating room under GA with US guidance.  I explained that this may be associated with failed entry of false passage into the uterus in which case will be in the same situation as we are now.  However if this yields successful sampling of the endometrial cavity, and that sampling is both concordant with the ultrasound findings and benign, she may avoid a more radical surgery such as hysterectomy.  Alternatively if malignancy is premalignancy is identified we will be able to proceed promptly with surgical staging with hysterectomy and sentinel lymph node biopsy.  The other option would be to proceed immediately with hysterectomy.  I feel that she is a good surgical risk with normal body weight, minimal medical comorbidities, no prior abdominal surgeries.  I feel that she would be a low risk candidate for hysterectomy.  I explained that all surgeries contain some risk which include  bleeding, infection, damage to internal organs (such as bladder,ureters, bowels), blood clot, reoperation and rehospitalization.  We would be relying on frozen section pathology to determine if surgical staging is necessary, and explained to the patient that this is limited compared to definitive preoperative pathology from endometrial sampling.  I explained that robotic hysterectomy is typically an outpatient procedure.  I explained that she would  have some convalescence and explained what this would likely involve.  After hearing her 2 options she is favoring the more conservative approach with repeated attempts at endometrial sampling, this time in the operating room under ultrasound guidance.  She'll be scheduled for a D&C under ultrasound guidance later in January.  I will see her back postoperatively discussed the pathology.  We will discuss with genetics if she needs to have updated assessment given that her last testing was in 2016 and more baby known about her BRCA2 variant.  If it is determined that her BRCA2 variant is associated with the known increased risk for ovarian cancer, I would strongly recommend that the patient proceed with risk reducing hysterectomy and BSO.  There is a potential some slightly increased risk for serous endometrial cancer associated with BRCA2 germline mutation in addition to the significantly increased risk for ovarian cancer.  HPI: Catherine Lawson is a 65 year old P0 who was seen in consultation at the request of Dr Stann Mainland for evaluation of postmenopausal bleeding and thickened endometrium after failed office sampling attempts.  The patient has a history of ER/PR positive breast cancer diagnosed in 2016 in the right breast.  This was stage I and treated with chemotherapy, lumpectomy, and postoperative aromatase inhibitor followed by tamoxifen due to intolerance.  She had planned duration of tamoxifen therapy until 2023.  She developed symptoms of a single episode of postmenopausal bleeding in September 2021.  She visited her gynecologist Dr. Stann Mainland for a symptom directed visit at this time.  Of note the patient reported a remote history of cervical dysplasia in 1997  treated with cryotherapy.  She is known to have had normal Pap tests since that time with negative high-risk HPV.  On April 10, 2020 when she was seen by Dr. Stann Mainland for this new symptom of postmenopausal bleeding, a transvaginal ultrasound scan  was performed in the office on that day, which revealed a uterus measuring 7.7 x 4.1 x 5.8 cm.  There was a separate partially exophytic myometrial mass measuring about 4 cm which was likely a fibroid.  The endometrium was thickened at 18 mm.  The ovaries were grossly normal bilaterally and well visualized.  There was no free fluid.  Office endometrial Pipelle sampling was attempted the same day and revealed only scant atrophic endometrium and minute fragments of benign endocervical glands due to cervical stenosis.  She was referred to Dr. Gwynne Edinger partner for office hysteroscopy and D&C which was attempted on June 26, 2020.  She had been prescribed and administered measle processed all 4 hours preoperatively.  Cervical stenosis prevented adequate entry into the endometrial cavity.  Endometrial curettage was performed blindly which revealed minute fragments of benign endocervical glands.  Her medical history is most significant for breast cancer (see above), SVT treated with ablation.  Her surgical history is most significant for cardiac ablation and oral surgery.  Her gynecologic history is remarkable for no prior vaginal births or pregnancies, remote history of cervical dysplasia treated with cryotherapy.  Her family cancer history is significant for sister who died from breast cancer at age 35.  She has another sister who was diagnosed with DCIS.  Maternal grandmother died in her 31s from a disseminated abdominal cancer that was not treated.  Her paternal grandmother died from stomach cancer at age 40.  Her father was diagnosed with colon cancer at age 22.  Her brothers son (her nephew) died of medulloblastoma at age 34.  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 She lives with her husband who is of good health.   Current Meds:  Outpatient Encounter Medications as of 07/18/2020  Medication Sig  . calcium carbonate (TUMS - DOSED IN MG  ELEMENTAL CALCIUM) 500 MG chewable tablet Chew 1 tablet by mouth daily.  . cetirizine (ZYRTEC) 10 MG tablet Take 10 mg by mouth daily.  . Crisaborole 2 % OINT Eucrisa 2 % topical ointment  . fluocinolone (VANOS) 0.01 % cream Apply topically 2 (two) times daily.  . Multiple Vitamin (MULTIVITAMIN) tablet Take 1 tablet by mouth daily.  . Omega-3 Fatty Acids (FISH OIL TRIPLE STRENGTH) 1400 MG CAPS Take 1,400 mg by mouth daily.  . Probiotic Product (PROBIOTIC PO) Take by mouth.  . tamoxifen (NOLVADEX) 20 MG tablet TAKE 1 TABLET BY MOUTH EVERY DAY  . [DISCONTINUED] Omega-3 Fatty Acids (FISH OIL OMEGA-3 PO) Take 2 tablets by mouth daily.   Facility-Administered Encounter Medications as of 07/18/2020  Medication  . 0.9 %  sodium chloride infusion    Allergy:  Allergies  Allergen Reactions  . Other Itching and Dermatitis    Seasonal Allergies    Social Hx:   Social History   Socioeconomic History  . Marital status: Married    Spouse name: Event organiser  . Number of children: 0  . Years of education: Not on file  . Highest education level: Not on file  Occupational History  . Not on file  Tobacco Use  . Smoking status: Never Smoker  . Smokeless tobacco: Never Used  Vaping Use  . Vaping Use:  Never used  Substance and Sexual Activity  . Alcohol use: Yes    Alcohol/week: 4.0 - 5.0 standard drinks    Types: 4 - 5 Glasses of wine per week    Comment: social  . Drug use: No  . Sexual activity: Not Currently  Other Topics Concern  . Not on file  Social History Narrative   Lives in [place]   Retired   married   Social Determinants of Health   Financial Resource Strain: Not on file  Food Insecurity: Not on file  Transportation Needs: Not on file  Physical Activity: Not on file  Stress: Not on file  Social Connections: Not on file  Intimate Partner Violence: Not on file    Past Surgical Hx:  Past Surgical History:  Procedure Laterality Date  . COLONOSCOPY    .  ELECTROPHYSIOLOGIC STUDY N/A 11/28/2015   left lateral accessory pathway and slow pathway ablation performed by Dr Rayann Heman  . POLYPECTOMY    . PORTACATH PLACEMENT Right 06/27/2015   Procedure: INSERTION PORT-A-CATH;  Surgeon: Excell Seltzer, MD;  Location: [facility];  Service: General;  Laterality: Right;  . RADIOACTIVE SEED GUIDED PARTIAL MASTECTOMY WITH AXILLARY SENTINEL LYMPH NODE BIOPSY Right 05/29/2015   Procedure: RADIOACTIVE SEED RIGHT BREAST LUMPECTOMY WITH AXILLARY SENTINEL LYMPH NODE BIOPSY;  Surgeon: Excell Seltzer, MD;  Location: [facility];  Service: General;  Laterality: Right;  . RE-EXCISION OF BREAST LUMPECTOMY Right 06/12/2015   Procedure: RE-EXCISION OF RIGHT  BREAST LUMPECTOMY;  Surgeon: Excell Seltzer, MD;  Location: [facility];  Service: General;  Laterality: Right;  . WISDOM TOOTH EXTRACTION      Past Medical Hx:  Past Medical History:  Diagnosis Date  . Allergy   . Arthritis    bil thumb joints  . Breast cancer ([facility]) 06/2015  . Breast cancer of upper-outer quadrant of right female breast ([facility]) 02/06/2015   sx ,chemo,radiation  . Family history of breast cancer   . Family history of colon cancer   . Family history of stomach cancer   . SVT (supraventricular tachycardia) ([facility])    fast HR at times due to stress, no chest pain or SOB    Past Gynecological History:  See HPI No LMP recorded. Patient is postmenopausal.  Family Hx:  Family History  Problem Relation Age of Onset  . Breast cancer Sister 58  . Colon cancer Father 26  . Cancer Maternal Grandmother        probable ovarian cancer  . Stomach cancer Paternal Grandmother 20  . Brain cancer Other 10       Medulloblastoma  . Parkinson's disease Mother   . Lung cancer Paternal Uncle        smoker  . Stomach cancer Other        PGMs sister  . Stomach cancer Other        PMGs brother  . Colon polyps Brother   . Colon polyps Sister   . Esophageal cancer Neg Hx   .  Rectal cancer Neg Hx   . Uterine cancer Neg Hx     Review of Systems:  Constitutional  Feels well,    ENT Normal appearing ears and nares bilaterally Skin/Breast  No rash, sores, jaundice, itching, dryness Cardiovascular  No chest pain, shortness of breath, or edema  Pulmonary  No cough or wheeze.  Gastro Intestinal  No nausea, vomitting, or diarrhoea. No bright red blood per rectum, no abdominal pain, change in bowel movement, or  constipation.  Genito Urinary  No frequency, urgency, dysuria, + postmenopausal bleeding Musculo Skeletal  No myalgia, arthralgia, joint swelling or pain  Neurologic  No weakness, numbness, change in gait,  Psychology  No depression, anxiety, insomnia.   Vitals:  Blood pressure 128/64, pulse 78, temperature 97.7 F (36.5 C), temperature source Tympanic, resp. rate 16, height 5' 5"  (1.651 m), weight 150 lb 9.6 oz (68.3 kg), SpO2 99 %.  Physical Exam: WD in NAD Neck  Supple NROM, without any enlargements.  Lymph Node Survey No cervical supraclavicular or inguinal adenopathy Cardiovascular  Pulse normal rate, regularity and rhythm. S1 and S2 normal.  Lungs  Clear to auscultation bilateraly, without wheezes/crackles/rhonchi. Good air movement.  Skin  No rash/lesions/breakdown  Psychiatry  Alert and oriented to person, place, and time  Abdomen  Normoactive bowel sounds, abdomen soft, non-tender and thin without evidence of hernia.  Back No CVA tenderness Genito Urinary  Vulva/vagina: Normal external female genitalia.  No lesions. No discharge or bleeding.  Bladder/urethra:  No lesions or masses, well supported bladder  Vagina: normal  Cervix: Small, flush with upper vagina, normal appearing, no lesions.  Uterus: Small, mobile, no parametrial involvement or nodularity.  Adnexa: no palpable masses. Rectal  deferred Extremities  No bilateral cyanosis, clubbing or edema.  60 minutes of total time was spent for this patient encounter,  including preparation, face-to-face counseling with the patient and coordination of care, review of imaging (results and images), communication with the referring provider and documentation of the encounter.   Thereasa Solo, MD  07/18/2020, 12:09 PM

## 2020-07-18 NOTE — Patient Instructions (Signed)
Dr Andrey Farmer is recommending a D&C in the operating room under ultrasound guidance to attempt to sample the uterine lining to rule out cancer.   Please contact our office at [phone] when you have decided if the 11th or 24th works best.   Warner Mccreedy, NP will contact you with more specific logistics about the procedure.

## 2020-07-18 NOTE — Telephone Encounter (Addendum)
[name] stating that she would like to have surgery on 08-07-20. Hysteroscopy and D&C. She also would like to know what Maylon Cos concluded about review of previous genetic consultation. [name] will be in a Dental appointment today from 1430 to 1530.  She can be reached after that to discuss surgery time for 08-07-20 Information given to [facility],NP 07-18-20

## 2020-07-19 ENCOUNTER — Encounter: Payer: Self-pay | Admitting: Gynecologic Oncology

## 2020-07-19 DIAGNOSIS — R9389 Abnormal findings on diagnostic imaging of other specified body structures: Secondary | ICD-10-CM | POA: Insufficient documentation

## 2020-07-20 ENCOUNTER — Other Ambulatory Visit: Payer: Self-pay | Admitting: Gynecologic Oncology

## 2020-07-20 DIAGNOSIS — R9389 Abnormal findings on diagnostic imaging of other specified body structures: Secondary | ICD-10-CM

## 2020-07-20 DIAGNOSIS — N950 Postmenopausal bleeding: Secondary | ICD-10-CM

## 2020-07-21 ENCOUNTER — Telehealth: Payer: Self-pay | Admitting: Oncology

## 2020-07-21 ENCOUNTER — Encounter: Payer: Self-pay | Admitting: Gynecologic Oncology

## 2020-07-21 ENCOUNTER — Other Ambulatory Visit: Payer: Self-pay | Admitting: Gynecologic Oncology

## 2020-07-21 DIAGNOSIS — R9389 Abnormal findings on diagnostic imaging of other specified body structures: Secondary | ICD-10-CM

## 2020-07-21 DIAGNOSIS — N950 Postmenopausal bleeding: Secondary | ICD-10-CM

## 2020-07-21 NOTE — Telephone Encounter (Signed)
Called Catherine Lawson and notified her that [name], NP has sent her a mychart message regarding surgery and further genetic testing.  She said that she would like further testing only if her insurance would cover the cost.  Advised her that we will try to find out what it covers and call her back.   She also asked if anything needs to be done preop for her cervical stenosis to increase the chances of it opening for surgery.  Advised her that we will get back to her.

## 2020-07-26 ENCOUNTER — Other Ambulatory Visit: Payer: Self-pay | Admitting: Gynecologic Oncology

## 2020-07-26 DIAGNOSIS — N882 Stricture and stenosis of cervix uteri: Secondary | ICD-10-CM

## 2020-07-26 MED ORDER — MISOPROSTOL 200 MCG PO TABS: 200.0000 ug | ORAL_TABLET | ORAL | 0 refills | Status: DC

## 2020-07-26 NOTE — Progress Notes (Signed)
Plan for cytotec to be inserted in the vagina 12 hours before and 2 hours before the procedure.

## 2020-08-03 ENCOUNTER — Other Ambulatory Visit: Payer: Self-pay

## 2020-08-03 ENCOUNTER — Encounter (HOSPITAL_BASED_OUTPATIENT_CLINIC_OR_DEPARTMENT_OTHER): Payer: Self-pay | Admitting: Gynecologic Oncology

## 2020-08-03 NOTE — Progress Notes (Signed)
Spoke w/ via phone for pre-op interview--- Pt Lab needs dos---- EKG              Lab results------ no COVID test ------ 08-05-2019 @ 1120 Arrive at ------- 0630 NPO after MN NO Solid Food.  Clear liquids from MN until--- 0530 Medications to take morning of surgery ----- NONE Diabetic medication ----- n/a Patient Special Instructions ----- n/a Pre-Op special Istructions ----- n/a Patient verbalized understanding of instructions that were given at this phone interview. Patient denies shortness of breath, chest pain, fever, cough at this phone interview.

## 2020-08-04 ENCOUNTER — Other Ambulatory Visit (HOSPITAL_COMMUNITY)
Admission: RE | Admit: 2020-08-04 | Discharge: 2020-08-04 | Disposition: A | Discharge status: Home / Self Care | Payer: 59 | Source: Ambulatory Visit | Attending: Gynecologic Oncology | Admitting: Gynecologic Oncology

## 2020-08-04 ENCOUNTER — Telehealth: Payer: Self-pay

## 2020-08-04 DIAGNOSIS — Z20822 Contact with and (suspected) exposure to covid-19: Secondary | ICD-10-CM | POA: Diagnosis not present

## 2020-08-04 DIAGNOSIS — Z01812 Encounter for preprocedural laboratory examination: Secondary | ICD-10-CM | POA: Diagnosis not present

## 2020-08-04 LAB — SARS CORONAVIRUS 2 (TAT 6-24 HRS): SARS Coronavirus 2: NEGATIVE

## 2020-08-04 NOTE — Telephone Encounter (Signed)
Catherine Lawson stated that she understands her pre op instructions and does not have any questions or concerns at this time.

## 2020-08-05 ENCOUNTER — Telehealth: Payer: Self-pay | Admitting: Gynecologic Oncology

## 2020-08-05 NOTE — Telephone Encounter (Signed)
Called patient and left her message that I would need to reschedule her surgery to a later date or have my partner, Dr Berline Lopes, perform the surgery as scheduled on Monday. I will contact her back in 10-15 minutes to find out her response. Everitt Amber

## 2020-08-06 NOTE — Anesthesia Preprocedure Evaluation (Addendum)
Anesthesia Evaluation  Patient identified by MRN, date of birth, ID band Patient awake    Reviewed: Allergy & Precautions, NPO status , Patient's Chart, lab work & pertinent test results  Airway Mallampati: II  TM Distance: >3 FB Neck ROM: Full    Dental no notable dental hx. (+) Teeth Intact, Dental Advisory Given   Pulmonary neg pulmonary ROS,    Pulmonary exam normal breath sounds clear to auscultation       Cardiovascular Exercise Tolerance: Good Normal cardiovascular exam Rhythm:Regular Rate:Normal     Neuro/Psych negative neurological ROS  negative psych ROS   GI/Hepatic negative GI ROS, Neg liver ROS,   Endo/Other  negative endocrine ROS  Renal/GU   negative genitourinary   Musculoskeletal  (+) Arthritis ,   Abdominal   Peds  Hematology   Anesthesia Other Findings Breast CA  Reproductive/Obstetrics                            Anesthesia Physical Anesthesia Plan  ASA: III  Anesthesia Plan: General   Post-op Pain Management:    Induction: Intravenous  PONV Risk Score and Plan: 4 or greater and Treatment may vary due to age or medical condition, Ondansetron, Midazolam and Amisulpride  Airway Management Planned: LMA  Additional Equipment: None  Intra-op Plan:   Post-operative Plan:   Informed Consent: I have reviewed the patients History and Physical, chart, labs and discussed the procedure including the risks, benefits and alternatives for the proposed anesthesia with the patient or authorized representative who has indicated his/her understanding and acceptance.     Dental advisory given  Plan Discussed with: CRNA  Anesthesia Plan Comments:        Anesthesia Quick Evaluation

## 2020-08-07 ENCOUNTER — Ambulatory Visit (HOSPITAL_BASED_OUTPATIENT_CLINIC_OR_DEPARTMENT_OTHER)
Admission: RE | Admit: 2020-08-07 | Discharge: 2020-08-07 | Disposition: A | Discharge status: Home / Self Care | Payer: 59 | Attending: Gynecologic Oncology | Admitting: Gynecologic Oncology

## 2020-08-07 ENCOUNTER — Other Ambulatory Visit: Payer: Self-pay

## 2020-08-07 ENCOUNTER — Encounter (HOSPITAL_BASED_OUTPATIENT_CLINIC_OR_DEPARTMENT_OTHER): Payer: Self-pay | Admitting: Gynecologic Oncology

## 2020-08-07 ENCOUNTER — Ambulatory Visit (HOSPITAL_BASED_OUTPATIENT_CLINIC_OR_DEPARTMENT_OTHER): Payer: 59 | Admitting: Anesthesiology

## 2020-08-07 ENCOUNTER — Ambulatory Visit (HOSPITAL_COMMUNITY)
Admission: RE | Admit: 2020-08-07 | Discharge: 2020-08-07 | Disposition: A | Discharge status: Home / Self Care | Payer: 59 | Source: Ambulatory Visit | Attending: Gynecologic Oncology | Admitting: Gynecologic Oncology

## 2020-08-07 ENCOUNTER — Encounter (HOSPITAL_BASED_OUTPATIENT_CLINIC_OR_DEPARTMENT_OTHER)
Admission: RE | Disposition: A | Discharge status: Home / Self Care | Payer: Self-pay | Source: Home / Self Care | Attending: Gynecologic Oncology

## 2020-08-07 DIAGNOSIS — N882 Stricture and stenosis of cervix uteri: Secondary | ICD-10-CM | POA: Diagnosis not present

## 2020-08-07 DIAGNOSIS — Z9221 Personal history of antineoplastic chemotherapy: Secondary | ICD-10-CM | POA: Diagnosis not present

## 2020-08-07 DIAGNOSIS — N950 Postmenopausal bleeding: Secondary | ICD-10-CM

## 2020-08-07 DIAGNOSIS — Z170 Estrogen receptor positive status [ER+]: Secondary | ICD-10-CM | POA: Diagnosis not present

## 2020-08-07 DIAGNOSIS — Z87410 Personal history of cervical dysplasia: Secondary | ICD-10-CM | POA: Insufficient documentation

## 2020-08-07 DIAGNOSIS — Z803 Family history of malignant neoplasm of breast: Secondary | ICD-10-CM | POA: Diagnosis not present

## 2020-08-07 DIAGNOSIS — Z800 Family history of malignant neoplasm of digestive organs: Secondary | ICD-10-CM | POA: Diagnosis not present

## 2020-08-07 DIAGNOSIS — C50411 Malignant neoplasm of upper-outer quadrant of right female breast: Secondary | ICD-10-CM | POA: Insufficient documentation

## 2020-08-07 DIAGNOSIS — Z923 Personal history of irradiation: Secondary | ICD-10-CM | POA: Diagnosis not present

## 2020-08-07 DIAGNOSIS — Z79899 Other long term (current) drug therapy: Secondary | ICD-10-CM | POA: Diagnosis not present

## 2020-08-07 DIAGNOSIS — R9389 Abnormal findings on diagnostic imaging of other specified body structures: Secondary | ICD-10-CM

## 2020-08-07 DIAGNOSIS — Z808 Family history of malignant neoplasm of other organs or systems: Secondary | ICD-10-CM | POA: Insufficient documentation

## 2020-08-07 DIAGNOSIS — N840 Polyp of corpus uteri: Secondary | ICD-10-CM | POA: Diagnosis not present

## 2020-08-07 DIAGNOSIS — Z79810 Long term (current) use of selective estrogen receptor modulators (SERMs): Secondary | ICD-10-CM | POA: Insufficient documentation

## 2020-08-07 HISTORY — DX: Personal history of antineoplastic chemotherapy: Z92.21

## 2020-08-07 HISTORY — DX: Malignant neoplasm of upper-outer quadrant of right female breast: C50.411

## 2020-08-07 HISTORY — DX: Personal history of adenomatous and serrated colon polyps: Z86.0101

## 2020-08-07 HISTORY — DX: Personal history of colonic polyps: Z86.010

## 2020-08-07 HISTORY — DX: Abnormal findings on diagnostic imaging of other specified body structures: R93.89

## 2020-08-07 HISTORY — DX: Dermatitis, unspecified: L30.9

## 2020-08-07 HISTORY — DX: Postmenopausal bleeding: N95.0

## 2020-08-07 HISTORY — DX: Stricture and stenosis of cervix uteri: N88.2

## 2020-08-07 HISTORY — PX: DILATION AND CURETTAGE OF UTERUS: SHX78

## 2020-08-07 HISTORY — DX: Family history of other specified conditions: Z84.89

## 2020-08-07 HISTORY — DX: Personal history of irradiation: Z92.3

## 2020-08-07 HISTORY — DX: Personal history of other diseases of the circulatory system: Z86.79

## 2020-08-07 HISTORY — DX: Estrogen receptor positive status (ER+): Z17.0

## 2020-08-07 SURGERY — DILATION AND CURETTAGE
Anesthesia: General | Site: Vagina

## 2020-08-07 MED ORDER — SCOPOLAMINE 1 MG/3DAYS TD PT72
1.0000 | MEDICATED_PATCH | TRANSDERMAL | Status: DC
  Administered 2020-08-07: 1.5 mg via TRANSDERMAL

## 2020-08-07 MED ORDER — GABAPENTIN 300 MG PO CAPS
300.0000 mg | ORAL_CAPSULE | ORAL | Status: AC
  Administered 2020-08-07: 300 mg via ORAL

## 2020-08-07 MED ORDER — MIDAZOLAM HCL 2 MG/2ML IJ SOLN
INTRAMUSCULAR | Status: AC
  Filled 2020-08-07: qty 2

## 2020-08-07 MED ORDER — ONDANSETRON HCL 4 MG/2ML IJ SOLN
INTRAMUSCULAR | Status: DC | PRN
  Administered 2020-08-07: 4 mg via INTRAVENOUS

## 2020-08-07 MED ORDER — ONDANSETRON HCL 4 MG/2ML IJ SOLN: 4.0000 mg | Freq: Once | INTRAMUSCULAR | Status: DC | PRN

## 2020-08-07 MED ORDER — HYDROMORPHONE HCL 1 MG/ML IJ SOLN: 0.2500 mg | INTRAMUSCULAR | Status: DC | PRN

## 2020-08-07 MED ORDER — FENTANYL CITRATE (PF) 100 MCG/2ML IJ SOLN
INTRAMUSCULAR | Status: DC | PRN
  Administered 2020-08-07: 25 ug via INTRAVENOUS

## 2020-08-07 MED ORDER — ACETAMINOPHEN 500 MG PO TABS
ORAL_TABLET | ORAL | Status: AC
  Filled 2020-08-07: qty 2

## 2020-08-07 MED ORDER — EPHEDRINE SULFATE-NACL 50-0.9 MG/10ML-% IV SOSY
PREFILLED_SYRINGE | INTRAVENOUS | Status: DC | PRN
  Administered 2020-08-07: 5 mg via INTRAVENOUS

## 2020-08-07 MED ORDER — CELECOXIB 200 MG PO CAPS
400.0000 mg | ORAL_CAPSULE | ORAL | Status: AC
  Administered 2020-08-07: 400 mg via ORAL

## 2020-08-07 MED ORDER — ONDANSETRON HCL 4 MG/2ML IJ SOLN: 4.0000 mg | Freq: Four times a day (QID) | INTRAMUSCULAR | Status: DC | PRN

## 2020-08-07 MED ORDER — KETOROLAC TROMETHAMINE 30 MG/ML IJ SOLN: 30.0000 mg | Freq: Once | INTRAMUSCULAR | Status: DC | PRN

## 2020-08-07 MED ORDER — PROPOFOL 10 MG/ML IV BOLUS
INTRAVENOUS | Status: AC
  Filled 2020-08-07: qty 20

## 2020-08-07 MED ORDER — LIDOCAINE 2% (20 MG/ML) 5 ML SYRINGE
INTRAMUSCULAR | Status: DC | PRN
  Administered 2020-08-07: 100 mg via INTRAVENOUS

## 2020-08-07 MED ORDER — SCOPOLAMINE 1 MG/3DAYS TD PT72
MEDICATED_PATCH | TRANSDERMAL | Status: AC
  Filled 2020-08-07: qty 1

## 2020-08-07 MED ORDER — ONDANSETRON HCL 4 MG/2ML IJ SOLN
INTRAMUSCULAR | Status: AC
  Filled 2020-08-07: qty 2

## 2020-08-07 MED ORDER — OXYCODONE HCL 5 MG/5ML PO SOLN: 5.0000 mg | Freq: Once | ORAL | Status: DC | PRN

## 2020-08-07 MED ORDER — TRAMADOL HCL 50 MG PO TABS: 50.0000 mg | ORAL_TABLET | Freq: Four times a day (QID) | ORAL | Status: DC | PRN

## 2020-08-07 MED ORDER — SODIUM CHLORIDE 0.9 % IR SOLN
Status: DC | PRN
  Administered 2020-08-07: 3000 mL

## 2020-08-07 MED ORDER — PHENYLEPHRINE 40 MCG/ML (10ML) SYRINGE FOR IV PUSH (FOR BLOOD PRESSURE SUPPORT)
PREFILLED_SYRINGE | INTRAVENOUS | Status: DC | PRN
  Administered 2020-08-07 (×4): 80 ug via INTRAVENOUS

## 2020-08-07 MED ORDER — FENTANYL CITRATE (PF) 250 MCG/5ML IJ SOLN
INTRAMUSCULAR | Status: AC
  Filled 2020-08-07: qty 5

## 2020-08-07 MED ORDER — LIDOCAINE HCL 1 % IJ SOLN
INTRAMUSCULAR | Status: DC | PRN
  Administered 2020-08-07: 10 mL

## 2020-08-07 MED ORDER — CELECOXIB 200 MG PO CAPS
ORAL_CAPSULE | ORAL | Status: AC
  Filled 2020-08-07: qty 2

## 2020-08-07 MED ORDER — OXYCODONE HCL 5 MG PO TABS: 5.0000 mg | ORAL_TABLET | Freq: Once | ORAL | Status: DC | PRN

## 2020-08-07 MED ORDER — EPHEDRINE 5 MG/ML INJ
INTRAVENOUS | Status: AC
  Filled 2020-08-07: qty 10

## 2020-08-07 MED ORDER — DEXAMETHASONE SODIUM PHOSPHATE 10 MG/ML IJ SOLN: 4.0000 mg | INTRAMUSCULAR | Status: DC

## 2020-08-07 MED ORDER — ONDANSETRON HCL 4 MG PO TABS: 4.0000 mg | ORAL_TABLET | Freq: Four times a day (QID) | ORAL | Status: DC | PRN

## 2020-08-07 MED ORDER — MIDAZOLAM HCL 5 MG/5ML IJ SOLN
INTRAMUSCULAR | Status: DC | PRN
  Administered 2020-08-07: 2 mg via INTRAVENOUS

## 2020-08-07 MED ORDER — PHENYLEPHRINE 40 MCG/ML (10ML) SYRINGE FOR IV PUSH (FOR BLOOD PRESSURE SUPPORT)
PREFILLED_SYRINGE | INTRAVENOUS | Status: AC
  Filled 2020-08-07: qty 10

## 2020-08-07 MED ORDER — PROPOFOL 10 MG/ML IV BOLUS
INTRAVENOUS | Status: DC | PRN
  Administered 2020-08-07: 130 mg via INTRAVENOUS

## 2020-08-07 MED ORDER — GABAPENTIN 300 MG PO CAPS
ORAL_CAPSULE | ORAL | Status: AC
  Filled 2020-08-07: qty 1

## 2020-08-07 MED ORDER — SILVER NITRATE-POT NITRATE 75-25 % EX MISC
CUTANEOUS | Status: DC | PRN
  Administered 2020-08-07: 3

## 2020-08-07 MED ORDER — LIDOCAINE HCL (PF) 2 % IJ SOLN
INTRAMUSCULAR | Status: AC
  Filled 2020-08-07: qty 5

## 2020-08-07 MED ORDER — LACTATED RINGERS IV SOLN: INTRAVENOUS | Status: DC

## 2020-08-07 MED ORDER — ACETAMINOPHEN 500 MG PO TABS
1000.0000 mg | ORAL_TABLET | ORAL | Status: AC
  Administered 2020-08-07: 1000 mg via ORAL

## 2020-08-07 MED ORDER — DEXAMETHASONE SODIUM PHOSPHATE 10 MG/ML IJ SOLN
INTRAMUSCULAR | Status: AC
  Filled 2020-08-07: qty 1

## 2020-08-07 MED ORDER — DEXAMETHASONE SODIUM PHOSPHATE 10 MG/ML IJ SOLN
INTRAMUSCULAR | Status: DC | PRN
  Administered 2020-08-07: 10 mg via INTRAVENOUS

## 2020-08-07 SURGICAL SUPPLY — 22 items
CATH ROBINSON RED A/P 16FR (CATHETERS) ×2 IMPLANT
COVER WAND RF STERILE (DRAPES) ×2 IMPLANT
DEVICE MYOSURE LITE (MISCELLANEOUS) IMPLANT
DEVICE MYOSURE REACH (MISCELLANEOUS) ×1 IMPLANT
GLOVE SURG ENC MOIS LTX SZ6 (GLOVE) ×4 IMPLANT
GLOVE SURG UNDER POLY LF SZ7 (GLOVE) ×1 IMPLANT
GOWN STRL REUS W/ TWL LRG LVL3 (GOWN DISPOSABLE) ×2 IMPLANT
GOWN STRL REUS W/TWL LRG LVL3 (GOWN DISPOSABLE) ×4
IV NS IRRIG 3000ML ARTHROMATIC (IV SOLUTION) ×1 IMPLANT
KIT PROCEDURE FLUENT (KITS) ×1 IMPLANT
KIT TURNOVER CYSTO (KITS) ×2 IMPLANT
NDL SPNL 22GX3.5 QUINCKE BK (NEEDLE) ×1 IMPLANT
NEEDLE SPNL 22GX3.5 QUINCKE BK (NEEDLE) ×2 IMPLANT
NS IRRIG 1000ML POUR BTL (IV SOLUTION) ×2 IMPLANT
NS IRRIG 500ML POUR BTL (IV SOLUTION) ×1 IMPLANT
PACK VAGINAL MINOR WOMEN LF (CUSTOM PROCEDURE TRAY) ×2 IMPLANT
PAD OB MATERNITY 4.3X12.25 (PERSONAL CARE ITEMS) ×2 IMPLANT
PENCIL SMOKE EVACUATOR (MISCELLANEOUS) IMPLANT
SEAL ROD LENS SCOPE MYOSURE (ABLATOR) ×1 IMPLANT
SYR BULB IRRIG 60ML STRL (SYRINGE) ×1 IMPLANT
TOWEL OR 17X26 10 PK STRL BLUE (TOWEL DISPOSABLE) ×2 IMPLANT
UNDERPAD 30X36 HEAVY ABSORB (UNDERPADS AND DIAPERS) ×3 IMPLANT

## 2020-08-07 NOTE — Anesthesia Procedure Notes (Signed)
Procedure Name: LMA Insertion Date/Time: 08/07/2020 8:06 AM Performed by: Rogers Blocker, CRNA Pre-anesthesia Checklist: Patient identified, Emergency Drugs available, Suction available and Patient being monitored Patient Re-evaluated:Patient Re-evaluated prior to induction Oxygen Delivery Method: Circle System Utilized Preoxygenation: Pre-oxygenation with 100% oxygen Induction Type: IV induction Ventilation: Mask ventilation without difficulty LMA: LMA inserted LMA Size: 4.0 Number of attempts: 1 Placement Confirmation: positive ETCO2 Tube secured with: Tape Dental Injury: Teeth and Oropharynx as per pre-operative assessment

## 2020-08-07 NOTE — Discharge Instructions (Addendum)
Dilation and Curettage or Vacuum Curettage, Care After  This sheet gives you information about how to care for yourself after your procedure. Your doctor may also give you more specific instructions. If you have problems or questions, contact your doctor. What can I expect after the procedure? After the procedure, it is common to have:  Mild pain or cramping.  Some bleeding or spotting from the vagina. These may last for up to 2 weeks. Follow these instructions at home: Medicines  Take over-the-counter and prescription medicines only as told by your doctor.   Ask your doctor if the medicine prescribed to you requires you to avoid driving or using machinery. Activity  If you were given a medicine to help you relax (sedative) during your procedure, it can affect you for many hours. Do not drive or use machinery until your doctor says that it is safe.  Rest as told by your doctor.  Do not sit for a long time without moving. Get up to take short walks every 1-2 hours. This is important. Ask for help if you feel weak or unsteady. Return to your normal activities as told by your doctor. Ask your doctor what activities are safe for you.   Lifestyle For at least 2 weeks, or as long as told by your doctor:  Do not douche.  Do not use tampons.  Do not have sex (4 weeks). General instructions  It is up to you to get the results of your procedure. Ask your doctor, or the department that is doing the procedure, when your results will be ready.  Keep all follow-up visits as told by your doctor. This is important. Contact a doctor if:  You have very bad cramps that get worse or do not get better with medicine.  You have very bad pain in your belly (abdomen).  You cannot drink fluids without vomiting.  You have pain in a different part of your pelvis. The pelvis is the area just above your thighs.  You have fluid from your vagina that smells bad.  You have a rash. Get help right  away if:  You are bleeding a lot from your vagina. A lot of bleeding means soaking more than one sanitary pad in 1 hour for 2 hours in a row.  You have a fever that is above 100.63F (38.0C).  Your belly feels very tender or hard.  You have chest pain.  You have trouble breathing.  You feel dizzy.  You feel light-headed.  You pass out (faint).  You have pain in your neck or shoulder area. These symptoms may be an emergency. Do not wait to see if the symptoms will go away. Get medical help right away. Call your local emergency services (911 in the [place] Do not drive yourself to the hospital. Summary  After your procedure, it is common to have pain or cramping. It is also common to have bleeding or spotting from your vagina.  Rest as told. Do not sit for a long time without moving. Get up to take short walks every 1-2 hours.  Do not lift anything that is heavier than 10 lb (4.5 kg), or the limit that you are told.  Contact your doctor if you have fluid from your vagina that smells bad.  Get help right away if you develop any problems from the procedure. Ask your doctor what problems to watch for. This information is not intended to replace advice given to you by your health care provider. Make  sure you discuss any questions you have with your health care provider. Document Revised: 08/03/2019 Document Reviewed: 08/03/2019 Elsevier Patient Education  2021 [place]  Return to work: 1 day if applicable  Activity: 1. Be up and out of the bed during the day.  Take a nap if needed.    2. No driving for minimum of 24 hours after the procedure.  3. You can shower as soon as the same day of surgery. No tub baths or submerging your body in water for 2 weeks after surgery.  4. No sexual activity and nothing in the vagina for 4 weeks.  5. You may experience vaginal spotting after surgery.  The spotting is normal but if you experience heavy  bleeding, call our office.  6. Take Tylenol or ibuprofen for pain. You can alternate these medications after surgery. You can start alternating these medications at 1:15p today, if needed for pain/discomfort.  Diet: 1. Low sodium Heart Healthy Diet is recommended but you are cleared to resume your normal (before surgery) diet after your procedure.  2. It is safe to use a laxative, such as Miralax or Colace, if you have difficulty moving your bowels.   Wound Care: 1. Keep clean and dry.  Shower daily.  Reasons to call the Doctor:  Fever - Oral temperature greater than 100.4 degrees Fahrenheit  Foul-smelling vaginal discharge  Difficulty urinating  Nausea and vomiting  Increased pain at the site of the incision that is unrelieved with pain medicine.  Difficulty breathing with or without chest pain  New calf pain especially if only on one side  Sudden, continuing increased vaginal bleeding with or without clots.   Contacts: For questions or concerns you should contact:  Dr. Jeral Pinch or Dr. Everitt Amber at [phone]  [name], NP at [phone]  After Hours: call [phone] and have the GYN Oncologist paged/contacted (after 5 pm or on the weekends).  Messages sent via mychart are for non-urgent matters and are not responded to after hours so for urgent needs, please call the after hours number.   Post Anesthesia Home Care Instructions  Activity: Get plenty of rest for the remainder of the day. A responsible individual must stay with you for 24 hours following the procedure.  For the next 24 hours, DO NOT: -Drive a car -Paediatric nurse -Drink alcoholic beverages -Take any medication unless instructed by your physician -Make any legal decisions or sign important papers.  Meals: Start with liquid foods such as gelatin or soup. Progress to regular foods as tolerated. Avoid greasy, spicy, heavy foods. If nausea and/or vomiting occur, drink only clear  liquids until the nausea and/or vomiting subsides. Call your physician if vomiting continues.  Special Instructions/Symptoms: Your throat may feel dry or sore from the anesthesia or the breathing tube placed in your throat during surgery. If this causes discomfort, gargle with warm salt water. The discomfort should disappear within 24 hours.  If you had a scopolamine patch placed behind your ear for the management of post- operative nausea and/or vomiting:  1. The medication in the patch is effective for 72 hours, after which it should be removed.  Wrap patch in a tissue and discard in the trash. Wash hands thoroughly with soap and water. 2. You may remove the patch earlier than 72 hours if you experience unpleasant side effects which may include dry mouth, dizziness or visual disturbances. 3. Avoid touching the patch. Wash your hands with soap and water after  contact with the patch.

## 2020-08-07 NOTE — Progress Notes (Signed)
Received ambulatory from PACU, report received from [facility], [place]

## 2020-08-07 NOTE — Op Note (Signed)
PATIENT: Catherine Lawson DATE: 08/07/20  Preop Diagnosis: PMB, failed attempts at endometrial sampling, h/o breast cancer on Tamoxifen  Postoperative Diagnosis: same, suspected submucosal fibroids  Surgery: Hysteroscopy and D&C (dilation and curettage) with the use of the Myosure  Surgeons: Jeral Pinch, MD Assistant: none  Anesthesia: General   Estimated blood loss: 25 ml  IVF:  See I&O flowsheet   Fluid net balance: -150 ml  Urine output: none   Complications: None [place]  Pathology: endometrial curetteings  Operative findings: Small mobile uterus, cervix small and flush with the vagina. On hysteroscopy, normal appearing endocervical canal. What appeared to be a submucosal fibroid along the right lateral wall of the uterine body and the left lateral/posterior fundal wall. The latter of these had some atypical vasculature. Tissue otherwise atrophic in appearance. Left tubal ostia appreciated, right not seen.  Procedure: The patient was identified in the preoperative holding area. Informed consent was signed on the chart. Patient was seen history was reviewed and exam was performed.   The patient was then taken to the operating room and placed in the supine position with SCD hose on. General anesthesia was then induced without difficulty. She was then placed in the dorsolithotomy position. The perineum was prepped with Betadine. The vagina was prepped with Betadine. The patient was then draped after the prep was dried.   Timeout was performed the patient, procedure, antibiotic, allergy, and length of procedure.   The speculum was placed in the vagina. The single tooth tenaculum was placed on the anterior lip of the cervix. The cervix was successively dilated using pratts dilators to 41F. The hysteroscope with the diagnostic scope was then used to achieve access to the endometrial cavity. Findings as noted above (pictures taken and in media). The Myosure hysteroscope  was then exchanged for the Myosure reach and sampling of both uterine cavity findings was performed. Pictures taken after sampling (in media). The specimen was collected in the sock and sent for permanent pathology. The hysteroscope was removed from the uterus.   The tenaculum was removed and hemostasis was observed after application of silver nitrate.   The vagina was irrigated.  All instrument, suture, laparotomy, Ray-Tec, and needle counts were correct x2.   The patient tolerated the procedure well and was taken recovery room in stable condition.   Jeral Pinch MD Gynecologic Oncology

## 2020-08-07 NOTE — Anesthesia Postprocedure Evaluation (Signed)
Anesthesia Post Note  Patient: Catherine Lawson  Procedure(s) Performed: DILATATION AND CURETTAGE OF UTERUS WITH HYSTEROSCOPY, MYOSURE (N/A Vagina )     Patient location during evaluation: PACU Anesthesia Type: General Level of consciousness: awake and alert Pain management: pain level controlled Vital Signs Assessment: post-procedure vital signs reviewed and stable Respiratory status: spontaneous breathing, nonlabored ventilation, respiratory function stable and patient connected to nasal cannula oxygen Cardiovascular status: blood pressure returned to baseline and stable Postop Assessment: no apparent nausea or vomiting Anesthetic complications: no   No complications documented.  Last Vitals:  Vitals:   08/07/20 0930 08/07/20 0937  BP: (!) 115/50   Pulse: 69 71  Resp: 13 16  Temp: 36.8 C   SpO2: 99% 97%    Last Pain:  Vitals:   08/07/20 0937  TempSrc:   PainSc: 0-No pain                 Barnet Glasgow

## 2020-08-07 NOTE — Interval H&P Note (Signed)
History and Physical Interval Note:  08/07/2020 7:40 AM  Catherine Lawson  has presented today for surgery, with the diagnosis of POST MENOPAUSAL BLEEDING  THICKENED ENDOMETRIUM.  The various methods of treatment have been discussed with the patient and family. After consideration of risks, benefits and other options for treatment, the patient has consented to  Procedure(s) with comments: [place] (N/A) - wants the [facility] hysteroscopy as a surgical intervention.  The patient's history has been reviewed, patient examined, no change in status, stable for surgery.  I have reviewed the patient's chart and labs.  Questions were answered to the patient's satisfaction.     [name]

## 2020-08-07 NOTE — Transfer of Care (Signed)
Immediate Anesthesia Transfer of Care Note  Patient: Catherine Lawson  Procedure(s) Performed: DILATATION AND CURETTAGE OF UTERUS WITH HYSTEROSCOPY, MYOSURE (N/A Vagina )  Patient Location: PACU  Anesthesia Type:General  Level of Consciousness: awake, alert , oriented and patient cooperative  Airway & Oxygen Therapy: Patient Spontanous Breathing  Post-op Assessment: Report given to RN and Post -op Vital signs reviewed and stable  Post vital signs: Reviewed and stable  Last Vitals:  Vitals Value Taken Time  BP    Temp 36.7 C 08/07/20 0847  Pulse    Resp    SpO2      Last Pain:  Vitals:   08/07/20 0708  TempSrc: Oral  PainSc: 0-No pain      Patients Stated Pain Goal: 5 (74/[id number])  Complications: No complications documented.

## 2020-08-08 ENCOUNTER — Telehealth: Payer: Self-pay

## 2020-08-08 ENCOUNTER — Telehealth: Payer: Self-pay | Admitting: Gynecologic Oncology

## 2020-08-08 ENCOUNTER — Encounter (HOSPITAL_BASED_OUTPATIENT_CLINIC_OR_DEPARTMENT_OTHER): Payer: Self-pay | Admitting: Gynecologic Oncology

## 2020-08-08 LAB — SURGICAL PATHOLOGY

## 2020-08-08 NOTE — Telephone Encounter (Signed)
Ms Brumbaugh states that she is eating,drinking, and urinating well. Passing gas. Had a good BM Afebrile. Light vaginal spotting. Pt aware of post op appointments as well as the office number [phone] and after hours number [phone] to call if she has any questions or concerns

## 2020-08-08 NOTE — Telephone Encounter (Signed)
Called patient and discussed pathology results -she is very happy with this news.  She is scheduled to come off of tamoxifen in August of this year.  She had previously been on aromatase inhibitor when she first started antiestrogen therapy.  She had to come off of this secondary to some side effects that were bothersome.  We discussed that tamoxifen as an ER agonist in the uterus is associated with overgrowth of the lining as well as hyperplasia and malignancy.  I think given the fact that she is only going to be on therapy for another 8 months or so, that we will just follow closely and hope that she does not have recurrent bleeding.  Jeral Pinch MD Gynecologic Oncology

## 2020-09-01 ENCOUNTER — Encounter: Payer: Self-pay | Admitting: Gynecologic Oncology

## 2020-09-01 ENCOUNTER — Telehealth: Payer: Self-pay

## 2020-09-01 NOTE — Telephone Encounter (Signed)
TC to review MU for upcoming appointment.  No answer, left message to return call.

## 2020-09-04 ENCOUNTER — Inpatient Hospital Stay: Payer: 59 | Attending: Gynecologic Oncology | Admitting: Gynecologic Oncology

## 2020-09-04 ENCOUNTER — Encounter: Payer: Self-pay | Admitting: Gynecologic Oncology

## 2020-09-04 ENCOUNTER — Other Ambulatory Visit: Payer: Self-pay

## 2020-09-04 VITALS — BP 137/62 | HR 82 | Temp 97.2°F | Resp 20 | Ht 65.0 in | Wt 152.0 lb

## 2020-09-04 DIAGNOSIS — C50411 Malignant neoplasm of upper-outer quadrant of right female breast: Secondary | ICD-10-CM

## 2020-09-04 DIAGNOSIS — Z148 Genetic carrier of other disease: Secondary | ICD-10-CM | POA: Insufficient documentation

## 2020-09-04 DIAGNOSIS — C50919 Malignant neoplasm of unspecified site of unspecified female breast: Secondary | ICD-10-CM | POA: Diagnosis present

## 2020-09-04 DIAGNOSIS — Z1504 Genetic susceptibility to malignant neoplasm of endometrium: Secondary | ICD-10-CM

## 2020-09-04 DIAGNOSIS — N950 Postmenopausal bleeding: Secondary | ICD-10-CM

## 2020-09-04 DIAGNOSIS — N840 Polyp of corpus uteri: Secondary | ICD-10-CM | POA: Insufficient documentation

## 2020-09-04 DIAGNOSIS — Z1502 Genetic susceptibility to malignant neoplasm of ovary: Secondary | ICD-10-CM

## 2020-09-04 DIAGNOSIS — Z1501 Genetic susceptibility to malignant neoplasm of breast: Secondary | ICD-10-CM

## 2020-09-04 DIAGNOSIS — Z7189 Other specified counseling: Secondary | ICD-10-CM

## 2020-09-04 DIAGNOSIS — Z170 Estrogen receptor positive status [ER+]: Secondary | ICD-10-CM | POA: Diagnosis not present

## 2020-09-04 NOTE — Patient Instructions (Signed)
Dr Denman George is scheduling you to see the genetics experts. After you have seen them and Dr Jana Hakim you can decide if you would like to proceed with removal of both tubes and ovaries or uterus and tubes and ovaries. Or no further surgery.  Please call Dr Denman George at [phone] when you have decided how you would like to proceed.

## 2020-09-04 NOTE — Progress Notes (Signed)
Follow-up Note: Gyn-Onc  Consult was requested by Dr. Stann Mainland for the evaluation of Catherine Lawson 65 y.o. female  CC:  Chief Complaint  Patient presents with  . Post-menopausal bleeding    Assessment/Plan:  Catherine Lawson  is a 65 y.o.  year old with a postmenopausal bleeding and a hx of ER/PR positive breast cancer and is on Tamoxifen. She has a BRCA2 germline mutation of undetermined clinical signficance. Her postmenopausal bleeding was determined to be secondary to a benign endometrial polyp. She will meet with genetics if she needs to have updated assessment given that her last testing was in 2016 and more baby known about her BRCA2 variant.  If it is determined that her BRCA2 variant is associated with the known increased risk for ovarian cancer, I would strongly recommend that the patient proceed with risk reducing BSO +/ hysterectomy (hysterectomy may be more valuable if she remains on Tamoxifen beyond 5 years).  There is a potential some slightly increased risk for serous endometrial cancer associated with BRCA1 germline mutation in addition to the significantly increased risk for ovarian cancer.  She will inform me of her plans to proceed with risk reducing surgery after she has met with genetics and Dr Jana Hakim in August.   HPI: Catherine Lawson is a 65 year old P0 who was seen in consultation at the request of Dr Stann Mainland for evaluation of postmenopausal bleeding and thickened endometrium after failed office sampling attempts.  The patient has a history of ER/PR positive breast cancer diagnosed in 2016 in the right breast.  This was stage I and treated with chemotherapy, lumpectomy, and postoperative aromatase inhibitor followed by tamoxifen due to intolerance.  She had planned duration of tamoxifen therapy until 2023.  She developed symptoms of a single episode of postmenopausal bleeding in September 2021.  She visited her gynecologist Dr. Stann Mainland for a symptom directed visit at  this time.  Of note the patient reported a remote history of cervical dysplasia in 1997 treated with cryotherapy.  She is known to have had normal Pap tests since that time with negative high-risk HPV.  On April 10, 2020 when she was seen by Dr. Stann Mainland for this new symptom of postmenopausal bleeding, a transvaginal ultrasound scan was performed in the office on that day, which revealed a uterus measuring 7.7 x 4.1 x 5.8 cm.  There was a separate partially exophytic myometrial mass measuring about 4 cm which was likely a fibroid.  The endometrium was thickened at 18 mm.  The ovaries were grossly normal bilaterally and well visualized.  There was no free fluid.  Office endometrial Pipelle sampling was attempted the same day and revealed only scant atrophic endometrium and minute fragments of benign endocervical glands due to cervical stenosis.  She was referred to Dr. Gwynne Edinger partner for office hysteroscopy and D&C which was attempted on June 26, 2020.  She had been prescribed and administered measle processed all 4 hours preoperatively.  Cervical stenosis prevented adequate entry into the endometrial cavity.  Endometrial curettage was performed blindly which revealed minute fragments of benign endocervical glands.  Interval Hx:  D&C myosure on 08/07/20 performed by Dr Berline Lopes was benign for a benign endometrioid polyp.   Current Meds:  Outpatient Encounter Medications as of 09/04/2020  Medication Sig  . calcium carbonate (TUMS - DOSED IN MG ELEMENTAL CALCIUM) 500 MG chewable tablet Chew 1 tablet by mouth daily.  . cetirizine (ZYRTEC) 10 MG tablet Take 10 mg by mouth daily.  Marland Kitchen  Crisaborole 2 % OINT as needed.  . fluocinolone (VANOS) 0.01 % cream Apply topically 2 (two) times daily as needed.  . Fluocinolone Acetonide Body 0.01 % OIL SMARTSIG:Sparingly Topical Twice Daily PRN  . Multiple Vitamin (MULTIVITAMIN) tablet Take 1 tablet by mouth daily.  . Omega-3 Fatty Acids (FISH OIL TRIPLE STRENGTH)  1400 MG CAPS Take 1,400 mg by mouth daily.  . Probiotic Product (PROBIOTIC PO) Take by mouth.  Marland Kitchen scopolamine (TRANSDERM-SCOP) 1 MG/3DAYS APPLY 1 PATCH ON SKIN BEHIND EAR AS NEEDED  . scopolamine (TRANSDERM-SCOP) 1 MG/3DAYS 1 patch to skin behind the ear as needed  . tamoxifen (NOLVADEX) 20 MG tablet TAKE 1 TABLET BY MOUTH EVERY DAY  . TURMERIC PO Take by mouth daily.   No facility-administered encounter medications on file as of 09/04/2020.    Allergy:  Allergies  Allergen Reactions  . Other Itching and Dermatitis    Seasonal Allergies    Social Hx:   Social History   Socioeconomic History  . Marital status: Married    Spouse name: Event organiser  . Number of children: 0  . Years of education: Not on file  . Highest education level: Not on file  Occupational History  . Not on file  Tobacco Use  . Smoking status: Never Smoker  . Smokeless tobacco: Never Used  Vaping Use  . Vaping Use: Never used  Substance and Sexual Activity  . Alcohol use: Yes    Alcohol/week: 4.0 - 5.0 standard drinks    Types: 4 - 5 Glasses of wine per week    Comment: social  . Drug use: Never  . Sexual activity: Not on file  Other Topics Concern  . Not on file  Social History Narrative   Lives in [place]   Retired   married   Social Determinants of Health   Financial Resource Strain: Not on file  Food Insecurity: Not on file  Transportation Needs: Not on file  Physical Activity: Not on file  Stress: Not on file  Social Connections: Not on file  Intimate Partner Violence: Not on file    Past Surgical Hx:  Past Surgical History:  Procedure Laterality Date  . COLONOSCOPY  last one 11-19-2018  . DILATION AND CURETTAGE OF UTERUS N/A 08/07/2020   Procedure: DILATATION AND CURETTAGE OF UTERUS WITH HYSTEROSCOPY, MYOSURE;  Surgeon: [name], MD;  Location: [facility];  Service: Gynecology;  Laterality: N/A;  . ELECTROPHYSIOLOGIC STUDY N/A 11/28/2015   left lateral  accessory pathway and slow pathway ablation performed by Dr Rayann Heman  . POLYPECTOMY    . PORT-A-CATH REMOVAL  06/2016  . PORTACATH PLACEMENT Right 06/27/2015   Procedure: INSERTION PORT-A-CATH;  Surgeon: Excell Seltzer, MD;  Location: [facility];  Service: General;  Laterality: Right;  . RADIOACTIVE SEED GUIDED PARTIAL MASTECTOMY WITH AXILLARY SENTINEL LYMPH NODE BIOPSY Right 05/29/2015   Procedure: RADIOACTIVE SEED RIGHT BREAST LUMPECTOMY WITH AXILLARY SENTINEL LYMPH NODE BIOPSY;  Surgeon: Excell Seltzer, MD;  Location: [facility];  Service: General;  Laterality: Right;  . RE-EXCISION OF BREAST LUMPECTOMY Right 06/12/2015   Procedure: RE-EXCISION OF RIGHT  BREAST LUMPECTOMY;  Surgeon: Excell Seltzer, MD;  Location: [facility];  Service: General;  Laterality: Right;  . WISDOM TOOTH EXTRACTION      Past Medical Hx:  Past Medical History:  Diagnosis Date  . Arthritis    bil thumb joints, back  . Cervical stenosis (uterine cervix)   . Eczema   . Family history of  adverse reaction to anesthesia    sister-- severe ponv  . Family history of breast cancer   . Family history of colon cancer   . Family history of stomach cancer   . History of adenomatous polyp of colon   . History of cancer chemotherapy 06-27-2015  to 08-30-2015   right breast cancer  . History of external beam radiation therapy 09-26-2015  to 11-10-2015   right breast cancer  . History of supraventricular tachycardia (08-03-2020  pt denies any symptoms since 2017   previously followed by cardiology, dr Rayann Heman,  s/p  svt ablation 11-28-2015,  lov note in epic 12-25-2015 pt released as needed;  echo and event monitor in epic 2017  . Malignant neoplasm of upper-outer quadrant of right breast in female, estrogen receptor positive [facility]) oncologist--- dr Jana Hakim   dx 11/ 2016--- Stage 1A, Grade 2, IDC, ER/PR positive;  s/p  right lumpectomy w/ sln dissection;  completed chemo 08-30-2015 and  radation 11-10-2015  . PMB (postmenopausal bleeding)   . S/P ablation of ventricular arrhythmia 11/28/2015  . Thickened endometrium     Past Gynecological History:  See HPI No LMP recorded. Patient is postmenopausal.  Family Hx:  Family History  Problem Relation Age of Onset  . Breast cancer Sister 92  . Colon cancer Father 75  . Cancer Maternal Grandmother        probable ovarian cancer  . Stomach cancer Paternal Grandmother 40  . Brain cancer Other 10       Medulloblastoma  . Parkinson's disease Mother   . Lung cancer Paternal Uncle        smoker  . Stomach cancer Other        PGMs sister  . Stomach cancer Other        PMGs brother  . Colon polyps Brother   . Colon polyps Sister   . Esophageal cancer Neg Hx   . Rectal cancer Neg Hx   . Uterine cancer Neg Hx     Review of Systems:  Constitutional  Feels well,    ENT Normal appearing ears and nares bilaterally Skin/Breast  No rash, sores, jaundice, itching, dryness Cardiovascular  No chest pain, shortness of breath, or edema  Pulmonary  No cough or wheeze.  Gastro Intestinal  No nausea, vomitting, or diarrhoea. No bright red blood per rectum, no abdominal pain, change in bowel movement, or constipation.  Genito Urinary  No frequency, urgency, dysuria, + postmenopausal bleeding Musculo Skeletal  No myalgia, arthralgia, joint swelling or pain  Neurologic  No weakness, numbness, change in gait,  Psychology  No depression, anxiety, insomnia.   Vitals:  Blood pressure 137/62, pulse 82, temperature (!) 97.2 F (36.2 C), temperature source Tympanic, resp. rate 20, height 5' 5"  (1.651 m), weight 152 lb (68.9 kg), SpO2 100 %.  Physical Exam: WD in NAD Neck  Supple NROM, without any enlargements.  Lymph Node Survey No cervical supraclavicular or inguinal adenopathy Cardiovascular  Pulse normal rate, regularity and rhythm. S1 and S2 normal.  Lungs  Clear to auscultation bilateraly, without  wheezes/crackles/rhonchi. Good air movement.  Skin  No rash/lesions/breakdown  Psychiatry  Alert and oriented to person, place, and time  Abdomen  Normoactive bowel sounds, abdomen soft, non-tender and thin without evidence of hernia.  Back No CVA tenderness Genito Urinary  Vulva/vagina: Normal external female genitalia.  No lesions. No discharge or bleeding.  Bladder/urethra:  No lesions or masses, well supported bladder  Vagina: normal  Cervix: Small, flush with  upper vagina, normal appearing, no lesions.  Uterus: Small, mobile, no parametrial involvement or nodularity.  Adnexa: no palpable masses. Rectal  deferred Extremities  No bilateral cyanosis, clubbing or edema.   30 minutes of direct face to face counseling time was spent with the patient. This included discussion about prognosis, therapy recommendations and postoperative side effects and are beyond the scope of routine postoperative care.    Thereasa Solo, MD  09/04/2020, 3:03 PM

## 2020-09-28 ENCOUNTER — Other Ambulatory Visit: Payer: Self-pay | Admitting: Licensed Clinical Social Worker

## 2020-09-28 DIAGNOSIS — Z8041 Family history of malignant neoplasm of ovary: Secondary | ICD-10-CM

## 2020-09-28 DIAGNOSIS — Z803 Family history of malignant neoplasm of breast: Secondary | ICD-10-CM

## 2020-09-28 DIAGNOSIS — Z800 Family history of malignant neoplasm of digestive organs: Secondary | ICD-10-CM

## 2020-09-28 DIAGNOSIS — C50411 Malignant neoplasm of upper-outer quadrant of right female breast: Secondary | ICD-10-CM

## 2020-10-02 ENCOUNTER — Other Ambulatory Visit: Payer: Self-pay

## 2020-10-02 ENCOUNTER — Inpatient Hospital Stay: Payer: 59 | Attending: Gynecologic Oncology | Admitting: Licensed Clinical Social Worker

## 2020-10-02 ENCOUNTER — Encounter: Payer: Self-pay | Admitting: Licensed Clinical Social Worker

## 2020-10-02 ENCOUNTER — Inpatient Hospital Stay: Payer: 59

## 2020-10-02 DIAGNOSIS — Z803 Family history of malignant neoplasm of breast: Secondary | ICD-10-CM | POA: Diagnosis not present

## 2020-10-02 DIAGNOSIS — C50411 Malignant neoplasm of upper-outer quadrant of right female breast: Secondary | ICD-10-CM

## 2020-10-02 DIAGNOSIS — Z800 Family history of malignant neoplasm of digestive organs: Secondary | ICD-10-CM

## 2020-10-02 DIAGNOSIS — Z1379 Encounter for other screening for genetic and chromosomal anomalies: Secondary | ICD-10-CM

## 2020-10-02 DIAGNOSIS — Z148 Genetic carrier of other disease: Secondary | ICD-10-CM | POA: Insufficient documentation

## 2020-10-02 DIAGNOSIS — Z1502 Genetic susceptibility to malignant neoplasm of ovary: Secondary | ICD-10-CM | POA: Diagnosis not present

## 2020-10-02 DIAGNOSIS — Z8041 Family history of malignant neoplasm of ovary: Secondary | ICD-10-CM

## 2020-10-02 DIAGNOSIS — Z170 Estrogen receptor positive status [ER+]: Secondary | ICD-10-CM

## 2020-10-02 LAB — COMPREHENSIVE METABOLIC PANEL
ALT: 25 U/L (ref 0–44)
AST: 28 U/L (ref 15–41)
Albumin: 4 g/dL (ref 3.5–5.0)
Alkaline Phosphatase: 84 U/L (ref 38–126)
Anion gap: 8 (ref 5–15)
BUN: 16 mg/dL (ref 8–23)
CO2: 27 mmol/L (ref 22–32)
Calcium: 9.4 mg/dL (ref 8.9–10.3)
Chloride: 104 mmol/L (ref 98–111)
Creatinine, Ser: 0.96 mg/dL (ref 0.44–1.00)
GFR, Estimated: >60 mL/min (ref >60)
Glucose, Bld: 92 mg/dL (ref 70–99)
Potassium: 4.2 mmol/L (ref 3.5–5.1)
Sodium: 139 mmol/L (ref 135–145)
Total Bilirubin: 0.6 mg/dL (ref 0.3–1.2)
Total Protein: 7.4 g/dL (ref 6.5–8.1)

## 2020-10-02 LAB — GENETIC SCREENING ORDER

## 2020-10-02 NOTE — Progress Notes (Signed)
REFERRING PROVIDER: Rossi, Emma, MD [address] [place],  [place] [address]  PRIMARY PROVIDER:  McNeill, Wendy, MD  PRIMARY REASON FOR VISIT:  1. Genetic testing   2. Malignant neoplasm of upper-outer quadrant of right breast in female, estrogen receptor positive (HCC)   3. Family history of breast cancer   4. Family history of colon cancer   5. Family history of ovarian cancer   6. Family history of stomach cancer     HISTORY OF PRESENT ILLNESS:   Catherine Lawson, a 64 y.o. female, was seen for a [facility] cancer genetics consultation at the request of Dr. Rossi due to a personal and family history of cancer.  Catherine Lawson presents to clinic today to discuss the possibility of a hereditary predisposition to cancer, genetic testing, and to further clarify her future cancer risks, as well as potential cancer risks for family members.   In 2016, at the age of 58, Catherine Lawson was diagnosed with right breast cancer, this was treated with lumpectomy, chemotherapy and radiation.  At the time she had genetic testing through [facility] and a VUS in BRCA2 was identified. This has since been updated/amended to a "Likely Benign" variant, snip of this is below for reference.  Catherine Lawson also notes that updated genetic testing would help with decision-making about hysterectomy.      CANCER HISTORY:  Oncology History  Malignant neoplasm of upper-outer quadrant of right breast in female, estrogen receptor positive (HCC)  02/06/2015 Initial Diagnosis   Breast cancer of upper-outer quadrant of right female breast      Past Medical History:  Diagnosis Date  . Arthritis    bil thumb joints, back  . Cervical stenosis (uterine cervix)   . Eczema   . Family history of adverse reaction to anesthesia    sister-- severe ponv  . Family history of breast cancer   . Family history of colon cancer   . Family history of stomach cancer   . History of adenomatous polyp of colon   . History of cancer chemotherapy  06-27-2015  to 08-30-2015   right breast cancer  . History of external beam radiation therapy 09-26-2015  to 11-10-2015   right breast cancer  . History of supraventricular tachycardia (08-03-2020  pt denies any symptoms since 2017   previously followed by cardiology, dr allred,  s/p  svt ablation 11-28-2015,  lov note in epic 12-25-2015 pt released as needed;  echo and event monitor in epic 2017  . Malignant neoplasm of upper-outer quadrant of right breast in female, estrogen receptor positive (HCC) oncologist--- dr magrinat   dx 11/ 2016--- Stage 1A, Grade 2, IDC, ER/PR positive;  s/p  right lumpectomy w/ sln dissection;  completed chemo 08-30-2015 and radation 11-10-2015  . PMB (postmenopausal bleeding)   . S/P ablation of ventricular arrhythmia 11/28/2015  . Thickened endometrium     Past Surgical History:  Procedure Laterality Date  . COLONOSCOPY  last one 11-19-2018  . DILATION AND CURETTAGE OF UTERUS N/A 08/07/2020   Procedure: DILATATION AND CURETTAGE OF UTERUS WITH HYSTEROSCOPY, MYOSURE;  Surgeon: Tucker, Katherine R, MD;  Location: [facility];  Service: Gynecology;  Laterality: N/A;  . ELECTROPHYSIOLOGIC STUDY N/A 11/28/2015   left lateral accessory pathway and slow pathway ablation performed by Dr Allred  . POLYPECTOMY    . PORT-A-CATH REMOVAL  06/2016  . PORTACATH PLACEMENT Right 06/27/2015   Procedure: INSERTION PORT-A-CATH;  Surgeon: Benjamin Hoxworth, MD;  Location: [facility];    Service: General;  Laterality: Right;  . RADIOACTIVE SEED GUIDED PARTIAL MASTECTOMY WITH AXILLARY SENTINEL LYMPH NODE BIOPSY Right 05/29/2015   Procedure: RADIOACTIVE SEED RIGHT BREAST LUMPECTOMY WITH AXILLARY SENTINEL LYMPH NODE BIOPSY;  Surgeon: Excell Seltzer, MD;  Location: [facility];  Service: General;  Laterality: Right;  . RE-EXCISION OF BREAST LUMPECTOMY Right 06/12/2015   Procedure: RE-EXCISION OF RIGHT  BREAST LUMPECTOMY;  Surgeon: Excell Seltzer, MD;  Location[facility];  Service: General;  Laterality: Right;  . WISDOM TOOTH EXTRACTION      Social History   Socioeconomic History  . Marital status: Married    Spouse name: Event organiser  . Number of children: 0  . Years of education: Not on file  . Highest education level: Not on file  Occupational History  . Not on file  Tobacco Use  . Smoking status: Never Smoker  . Smokeless tobacco: Never Used  Vaping Use  . Vaping Use: Never used  Substance and Sexual Activity  . Alcohol use: Yes    Alcohol/week: 4.0 - 5.0 standard drinks    Types: 4 - 5 Glasses of wine per week    Comment: social  . Drug use: Never  . Sexual activity: Not on file  Other Topics Concern  . Not on file  Social History Narrative   Lives in [place]   Retired   married   Social Determinants of Health   Financial Resource Strain: Not on file  Food Insecurity: Not on file  Transportation Needs: Not on file  Physical Activity: Not on file  Stress: Not on file  Social Connections: Not on file     FAMILY HISTORY:  We obtained a detailed, 4-generation family history.  Significant diagnoses are listed below: Family History  Problem Relation Age of Onset  . Breast cancer Sister 70  . Colon cancer Father 60  . Cancer Maternal Grandmother        probable ovarian cancer  . Stomach cancer Paternal Grandmother 78  . Brain cancer Other 10       Medulloblastoma  . Parkinson's disease Mother   . Lung cancer Paternal Uncle        smoker  . Stomach cancer Other        PGMs sister  . Stomach cancer Other        PMGs brother  . Colon polyps Brother   . Colon polyps Sister   . Breast cancer Sister        DCIS dx early 45s  . Prostate cancer Cousin        stage IV dx 22s  . Esophageal cancer Neg Hx   . Rectal cancer Neg Hx   . Uterine cancer Neg Hx    Catherine Lawson has 4 sisters and 5 brothers. One of her sisters had breast cancer at 72 and died at 53. Another sister had DCIS diagnosed in her  early 15s and is living in her 61s. A nephew had medulloblastoma.  Catherine Lawson mother passed in her 57s. Her mother, patient's grandmother, possibly had ovarian cancer. No other known cancers on this side of the family.  Catherine Lawson father had colon cancer at 51 and died at 35. One of his brothers had lung cancer. His mother, patient's grandmother, had stomach cancer, and her brother and sister also had stomach cancer. Patient's grandfather died at 48. He had a brother who had a son that was recently diagnosed with stage IV prostate cancer in his 80s. No other  known cancers on this side of the family.  Catherine Lawson is unaware of previous family history of genetic testing for hereditary cancer risks. Patient's maternal ancestors are of [place] and [place] descent, and paternal ancestors are of [place] descent. There is no reported Ashkenazi Jewish ancestry. There is no known consanguinity.    GENETIC COUNSELING ASSESSMENT: Catherine Lawson is a 65 y.o. female with a personal and family history of cancer which is somewhat suggestive of a hereditary cancer syndrome and predisposition to cancer. We, therefore, discussed and recommended the following at today's visit.   DISCUSSION: We discussed that approximately 5-10% of breast cancer is hereditary  Most cases of hereditary breast cancer are associated with BRCA1/BRCA2 genes, although there are other genes associated with hereditary cancer as well. While she had testing previously, we now can look at more genes than before and in better detail. There are also genes we can look at associated with other types of cancer in the family such as colon and stomach.  We discussed that testing is beneficial for several reasons including knowing about other cancer risks, identifying potential screening and risk-reduction options that may be appropriate, and to understand if other family members could be at risk for cancer and allow them to undergo genetic testing.   We reviewed the  characteristics, features and inheritance patterns of hereditary cancer syndromes. We also discussed genetic testing, including the appropriate family members to test, the process of testing, insurance coverage and turn-around-time for results. We discussed the implications of a negative, positive and/or variant of uncertain significant result. We recommended Catherine Lawson pursue genetic testing for the [facility] CancerNext+RNA gene panel.   The CancerNext+RNAinsight gene panel offered by [facility] includes sequencing and rearrangement analysis for the following 36 genes: APC*, ATM*, AXIN2, BARD1, BMPR1A, BRCA1*, BRCA2*, BRIP1*, CDH1*, CDK4, CDKN2A, CHEK2*, DICER1, MLH1*, MSH2*, MSH3, MSH6*, MUTYH*, NBN, NF1*, NTHL1, PALB2*, PMS2*, PTEN*, RAD51C*, RAD51D*, RECQL, SMAD4, SMARCA4, STK11 and TP53* (sequencing and deletion/duplication); HOXB13, POLD1 and POLE (sequencing only); EPCAM and GREM1 (deletion/duplication only).  Based on Catherine Lawson's personal and family history of cancer, she meets medical criteria for genetic testing. Despite that she meets criteria, she may still have an out of pocket cost. We discussed that if her out of pocket cost for testing is over $100, the laboratory will call and confirm whether she wants to proceed with testing.  If the out of pocket cost of testing is less than $100 she will be billed by the genetic testing laboratory.   PLAN: After considering the risks, benefits, and limitations, Catherine Lawson provided informed consent to pursue genetic testing and the blood sample was sent to [facility] for analysis of the CancerNext+RNA panel. Results should be available within approximately 2-3 weeks' time, at which point they will be disclosed by telephone to Catherine Lawson, as will any additional recommendations warranted by these results. Catherine Lawson will receive a summary of her genetic counseling visit and a copy of her results once available. This information will also be available in  Epic.   Lastly, we encouraged Catherine Lawson to remain in contact with cancer genetics annually so that we can continuously update the family history and inform her of any changes in cancer genetics and testing that may be of benefit for this family.   Catherine Lawson questions were answered to her satisfaction today. Our contact information was provided should additional questions or concerns arise. Thank you for the referral and allowing [place] to share in the care of  your patient.   Faith Rogue, MS, [facility] Genetic Counselor [email or web address] Phone: [phone]  The patient was seen for a total of 35 minutes in face-to-face genetic counseling. [employer] intern Magda Paganini was present and assisted with this case. Drs. Magrinat/Gudena/Feng were available for discussion regarding this case.   _______________________________________________________________________ For Office Staff:  Number of people involved in session: 2 Was an Intern/ student involved with case: yes

## 2020-10-17 ENCOUNTER — Telehealth: Payer: Self-pay | Admitting: Licensed Clinical Social Worker

## 2020-10-17 ENCOUNTER — Ambulatory Visit: Payer: Self-pay | Admitting: Licensed Clinical Social Worker

## 2020-10-17 DIAGNOSIS — C50411 Malignant neoplasm of upper-outer quadrant of right female breast: Secondary | ICD-10-CM

## 2020-10-17 DIAGNOSIS — Z803 Family history of malignant neoplasm of breast: Secondary | ICD-10-CM

## 2020-10-17 DIAGNOSIS — Z1379 Encounter for other screening for genetic and chromosomal anomalies: Secondary | ICD-10-CM

## 2020-10-17 DIAGNOSIS — Z8041 Family history of malignant neoplasm of ovary: Secondary | ICD-10-CM

## 2020-10-17 DIAGNOSIS — Z800 Family history of malignant neoplasm of digestive organs: Secondary | ICD-10-CM

## 2020-10-17 DIAGNOSIS — Z170 Estrogen receptor positive status [ER+]: Secondary | ICD-10-CM

## 2020-10-17 NOTE — Progress Notes (Signed)
HPI:  Catherine Lawson was previously seen in the [facility] clinic due to a personal and family history of cancer and concerns regarding a hereditary predisposition to cancer. Please refer to our prior cancer genetics clinic note for more information regarding our discussion, assessment and recommendations, at the time. Catherine Lawson recent genetic test results were disclosed to her, as were recommendations warranted by these results. These results and recommendations are discussed in more detail below.  CANCER HISTORY:  Oncology History  Malignant neoplasm of upper-outer quadrant of right breast in female, estrogen receptor positive ([place])  02/06/2015 Initial Diagnosis   Breast cancer of upper-outer quadrant of right female breast     FAMILY HISTORY:  We obtained a detailed, 4-generation family history.  Significant diagnoses are listed below: Family History  Problem Relation Age of Onset  . Breast cancer Sister 51  . Colon cancer Father 31  . Cancer Maternal Grandmother        probable ovarian cancer  . Stomach cancer Paternal Grandmother 63  . Brain cancer Other 10       Medulloblastoma  . Parkinson's disease Mother   . Lung cancer Paternal Uncle        smoker  . Stomach cancer Other        PGMs sister  . Stomach cancer Other        PMGs brother  . Colon polyps Brother   . Colon polyps Sister   . Breast cancer Sister        DCIS dx early 55s  . Prostate cancer Cousin        stage IV dx 83s  . Esophageal cancer Neg Hx   . Rectal cancer Neg Hx   . Uterine cancer Neg Hx    Catherine Lawson has 4 sisters and 5 brothers. One of her sisters had breast cancer at 62 and died at 31. Another sister had DCIS diagnosed in her early 42s and is living in her 58s. A nephew had medulloblastoma.  Catherine Lawson mother passed in her 15s. Her mother, patient's grandmother, possibly had ovarian cancer. No other known cancers on this side of the family.  Catherine Lawson father had colon cancer at  31 and died at 61. One of his brothers had lung cancer. His mother, patient's grandmother, had stomach cancer, and her brother and sister also had stomach cancer. Patient's grandfather died at 40. He had a brother who had a son that was recently diagnosed with stage IV prostate cancer in his 87s. No other known cancers on this side of the family.  Catherine Lawson is unaware of previous family history of genetic testing for hereditary cancer risks. Patient's maternal ancestors are of [place] descent, and paternal ancestors are of [place] descent. There is no reported Ashkenazi Jewish ancestry. There is no known consanguinity.    GENETIC TEST RESULTS: Genetic testing reported out on 10/16/2020 through the [facility] CancerNext-Expanded+RNA cancer panel found no pathogenic mutations.   The CancerNext-Expanded + RNAinsight gene panel offered by [facility] and includes sequencing and rearrangement analysis for the following 77 genes: IP, ALK, APC*, ATM*, AXIN2, BAP1, BARD1, BLM, BMPR1A, BRCA1*, BRCA2*, BRIP1*, CDC73, CDH1*,CDK4, CDKN1B, CDKN2A, CHEK2*, CTNNA1, DICER1, FANCC, FH, FLCN, GALNT12, KIF1B, LZTR1, MAX, MEN1, MET, MLH1*, MSH2*, MSH3, MSH6*, MUTYH*, NBN, NF1*, NF2, NTHL1, PALB2*, PHOX2B, PMS2*, POT1, PRKAR1A, PTCH1, PTEN*, RAD51C*, RAD51D*,RB1, RECQL, RET, SDHA, SDHAF2, SDHB, SDHC, SDHD, SMAD4, SMARCA4, SMARCB1, SMARCE1, STK11, SUFU, TMEM127, TP53*,TSC1, TSC2, VHL and XRCC2 (sequencing and deletion/duplication);  EGFR, EGLN1, HOXB13, KIT, MITF, PDGFRA, POLD1 and POLE (sequencing only); EPCAM and GREM1 (deletion/duplication only).   The test report has been scanned into EPIC and is located under the Molecular Pathology section of the Results Review tab.  A portion of the result report is included below for reference.     We discussed with Catherine Lawson that because current genetic testing is not perfect, it is possible there may be a gene mutation in one of these genes that current testing cannot detect,  but that chance is small.  We also discussed, that there could be another gene that has not yet been discovered, or that we have not yet tested, that is responsible for the cancer diagnoses in the family. It is also possible there is a hereditary cause for the cancer in the family that Catherine Lawson did not inherit and therefore was not identified in her testing.  Therefore, it is important to remain in touch with cancer genetics in the future so that we can continue to offer Catherine Lawson the most up to date genetic testing.   Genetic testing did identify a variant of uncertain significance (VUS) in the BRCA2 gene called [id number].  This is the same VUS that was identified through [facility]. This lab and others are now calling this variant likely benign, while [name] is still calling it an uncertain result.  At this time, it is unknown if this variant is associated with increased cancer risk or if this is a normal finding, but most variants such as this get reclassified to being inconsequential. It should not be used to make medical management decisions. With time, we suspect the lab will determine the significance of this variant, if any. If we do learn more about it, we will try to contact Catherine Lawson to discuss it further. However, it is important to stay in touch with [place] periodically and keep the address and phone number up to date.  ADDITIONAL GENETIC TESTING: We discussed with Catherine Lawson that her genetic testing was fairly extensive.  If there are genes identified to increase cancer risk that can be analyzed in the future, we would be happy to discuss and coordinate this testing at that time.    CANCER SCREENING RECOMMENDATIONS: Catherine Lawson test result is considered negative (normal).  This means that we have not identified a hereditary cause for her  personal and family history of cancer at this time. Most cancers happen by chance and this negative test suggests that her cancer may fall into this category.    While  reassuring, this does not definitively rule out a hereditary predisposition to cancer. It is still possible that there could be genetic mutations that are undetectable by current technology. There could be genetic mutations in genes that have not been tested or identified to increase cancer risk.  Therefore, it is recommended she continue to follow the cancer management and screening guidelines provided by her oncology and primary healthcare provider.   An individual's cancer risk and medical management are not determined by genetic test results alone. Overall cancer risk assessment incorporates additional factors, including personal medical history, family history, and any available genetic information that may result in a personalized plan for cancer prevention and surveillance.  RECOMMENDATIONS FOR FAMILY MEMBERS:  Relatives in this family might be at some increased risk of developing cancer, over the general population risk, simply due to the family history of cancer.  We recommended female relatives in this family have a yearly mammogram beginning  at age 70, or 55 years younger than the earliest onset of cancer, an annual clinical breast exam, and perform monthly breast self-exams. Female relatives in this family should also have a gynecological exam as recommended by their primary provider.  All family members should be referred for colonoscopy starting at age 61.    It is also possible there is a hereditary cause for the cancer in Ms. Narducci's family that she did not inherit and therefore was not identified in her.  Based on Ms. Delao's family history, we recommended those who have had cancer/paternal relatives have genetic counseling and testing. Ms. Castello will let us know if we can be of any assistance in coordinating genetic counseling and/or testing for these family members.  FOLLOW-UP: Lastly, we discussed with Ms. Kasperski that cancer genetics is a rapidly advancing field and it is possible that new  genetic tests will be appropriate for her and/or her family members in the future. We encouraged her to remain in contact with cancer genetics on an annual basis so we can update her personal and family histories and let her know of advances in cancer genetics that may benefit this family.   Our contact number was provided. Ms. Zachman questions were answered to her satisfaction, and she knows she is welcome to call us at anytime with additional questions or concerns.   Faith Rogue, MS, [facility] Genetic Counselor [email or web address] Phone: [phone]

## 2020-10-17 NOTE — Telephone Encounter (Signed)
Revealed negative genetic testing.  Revealed that a VUS in BRCA2 was identified; this is the same VUS identified on previous testing that was reclassified to "Likely Benign" by other labs.  We discussed that we do not know why she has had breast cancer or why there is cancer in the family. It could be due to a different gene that we are not testing, or something our current technology cannot pick up.  It will be important for her to keep in contact with genetics to learn if additional testing may be needed in the future.

## 2020-12-14 ENCOUNTER — Encounter: Payer: Self-pay | Admitting: Oncology

## 2020-12-23 IMAGING — US US PELVIS COMPLETE WITH TRANSVAGINAL
1 series · 13 of 25 positions shown · non-contrast
Comparison: None.

CLINICAL DATA: Post menopausal bleeding

EXAM:
TRANSABDOMINAL AND TRANSVAGINAL ULTRASOUND OF PELVIS
DOPPLER ULTRASOUND OF OVARIES
TECHNIQUE: Both transabdominal and transvaginal ultrasound examinations of the
pelvis were performed. Transabdominal technique was performed for
global imaging of the pelvis including uterus, ovaries, adnexal
regions, and pelvic cul-de-sac.
It was necessary to proceed with endovaginal exam following the
transabdominal exam to visualize the uterus endometrium ovaries.
Color and duplex Doppler ultrasound was utilized to evaluate blood
flow to the ovaries.

[Series 1: us pelvis complete with transvaginal · 101 acquisitions, 13 frames shown]
[im 1/101]
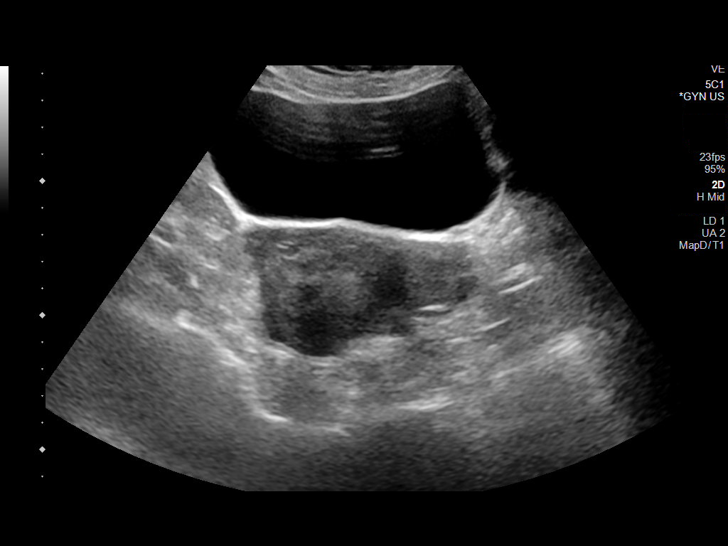
[im 9/101]
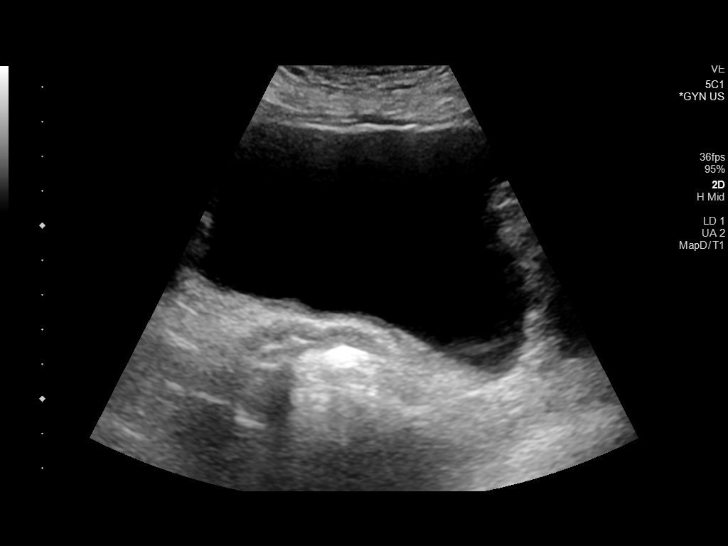
[im 17/101]
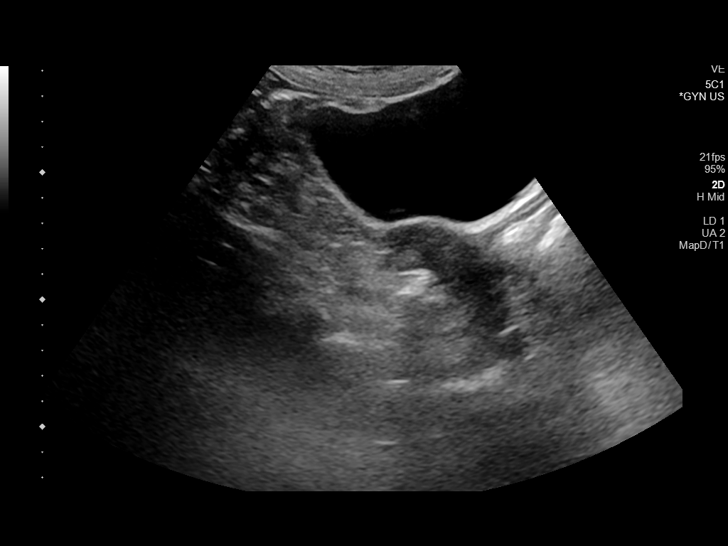
[im 26/101]
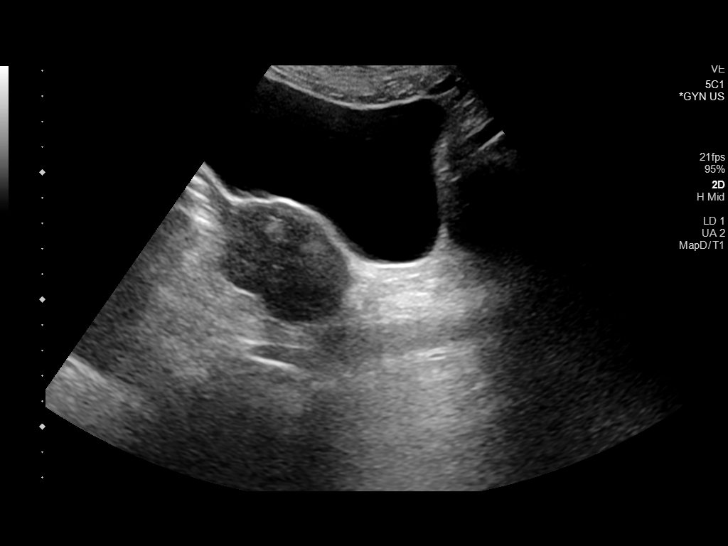
[im 34/101]
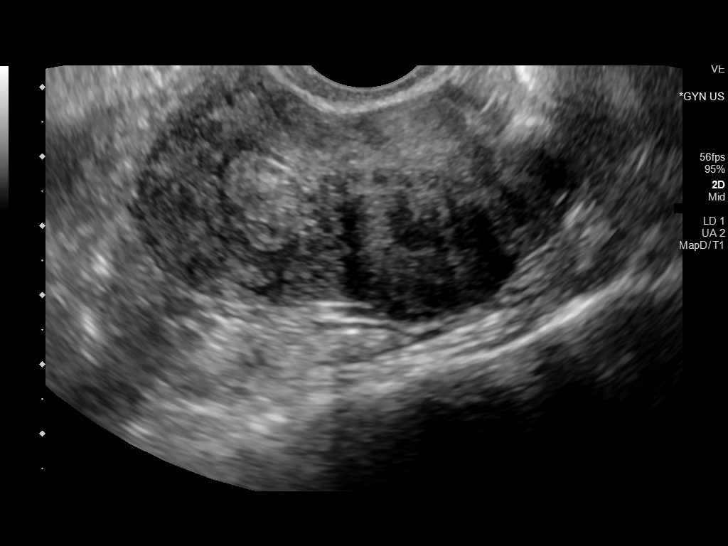
[im 42/101]
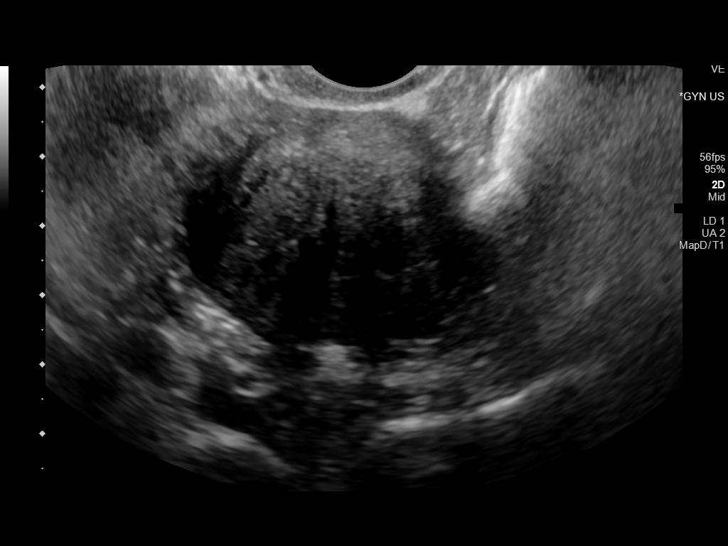
[im 51/101]
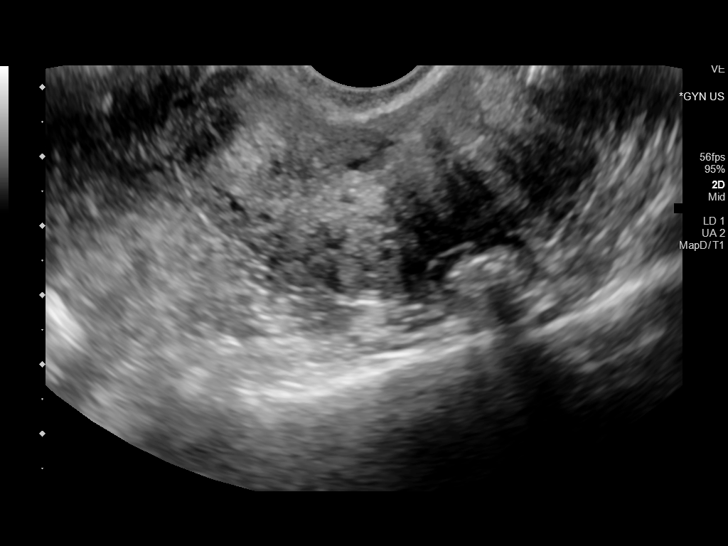
[im 59/101]
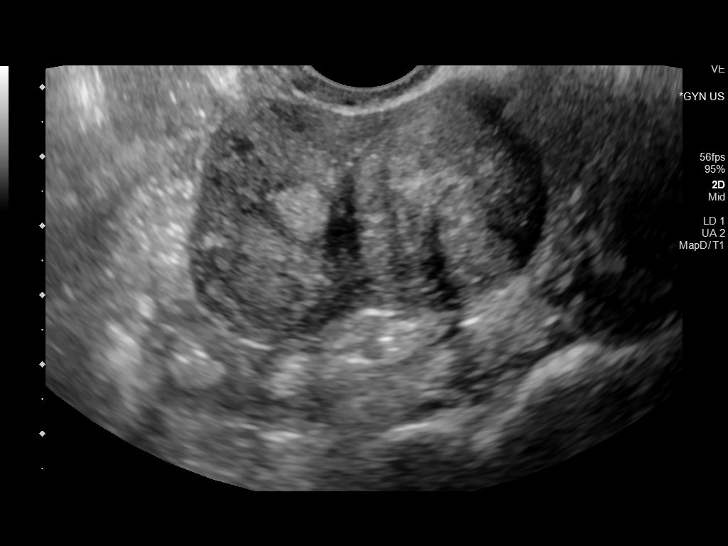
[im 67/101]
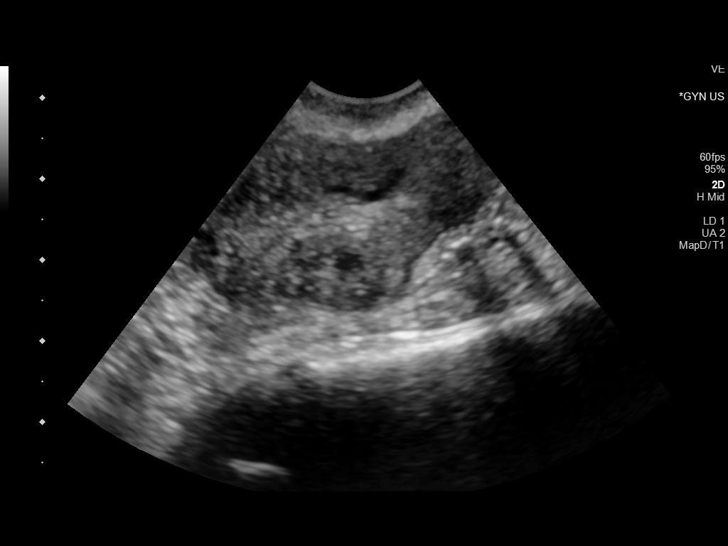
[im 76/101]
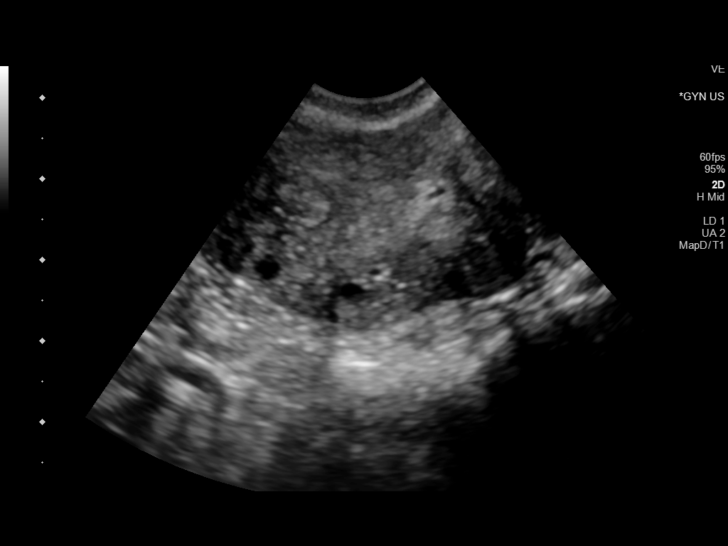
[im 84/101]
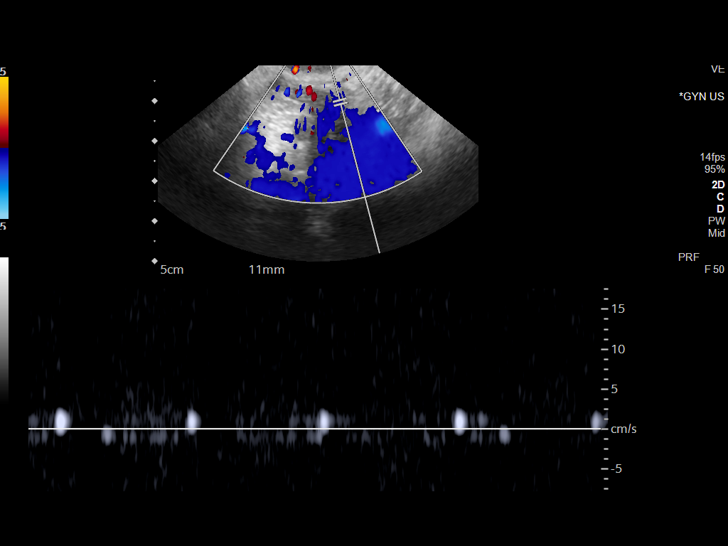
[im 92/101]
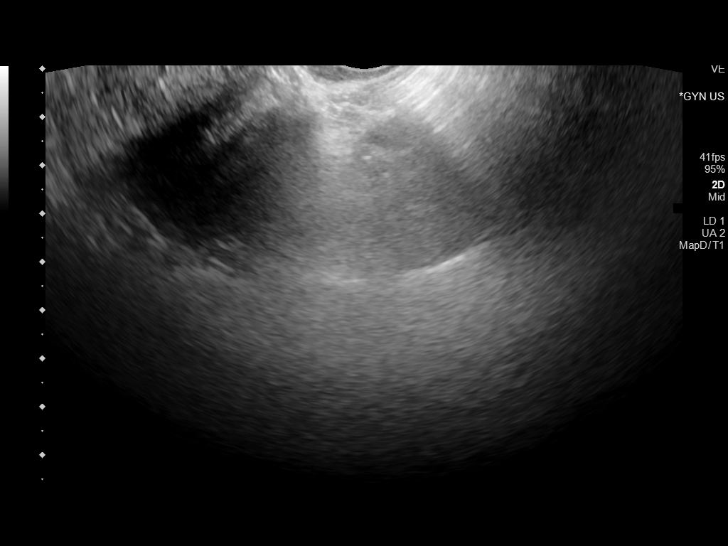
[im 101/101]
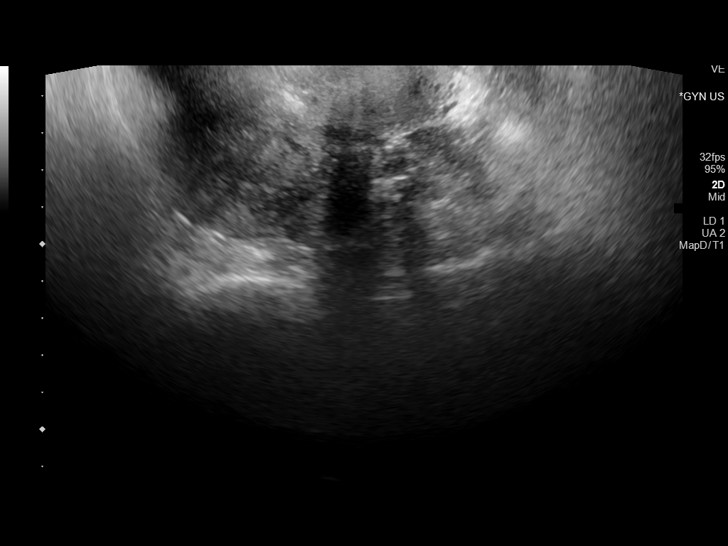

[13 of 25 positions shown; findings below may reference images not displayed]

FINDINGS: Uterus

Measurements: 7.7 x 4.1 x 5.8 cm = volume: 95 mL. Left posterior
partially exophytic myometrial mass measuring 4 x 2.8 x 3.5 cm.

Endometrium

Thickness: 18 mm.  No focal abnormality visualized.

Right ovary

Measurements: 2.4 x 1.2 x 1.8 cm = volume: 2.6 mL. Normal
appearance/no adnexal mass.

Left ovary

Measurements: 2.2 x 1.7 x 2 cm = volume: 3.9 mL. Normal
appearance/no adnexal mass.

Pulsed Doppler evaluation of both ovaries demonstrates normal
low-resistance arterial and venous waveforms.

Other findings

No abnormal free fluid.
IMPRESSION: 1. Endometrial thickness of 18 mm. In the setting of post-menopausal
bleeding, endometrial sampling is indicated to exclude carcinoma. If
results are benign, sonohysterogram should be considered for focal
lesion work-up. (Ref: Radiological Reasoning: Algorithmic Workup of
Abnormal Vaginal Bleeding with Endovaginal Sonography and
Sonohysterography. AJR [id number]; 191:S68-73)
2. 4 cm uterine fibroid.

## 2021-03-06 ENCOUNTER — Other Ambulatory Visit: Payer: Self-pay

## 2021-03-06 ENCOUNTER — Inpatient Hospital Stay: Payer: 59 | Attending: Oncology | Admitting: Oncology

## 2021-03-06 ENCOUNTER — Inpatient Hospital Stay: Payer: 59

## 2021-03-06 VITALS — BP 129/62 | HR 69 | Temp 97.6°F | Resp 16 | Ht 65.0 in | Wt 147.5 lb

## 2021-03-06 DIAGNOSIS — Z803 Family history of malignant neoplasm of breast: Secondary | ICD-10-CM | POA: Diagnosis not present

## 2021-03-06 DIAGNOSIS — Z79810 Long term (current) use of selective estrogen receptor modulators (SERMs): Secondary | ICD-10-CM | POA: Diagnosis not present

## 2021-03-06 DIAGNOSIS — Z8042 Family history of malignant neoplasm of prostate: Secondary | ICD-10-CM | POA: Diagnosis not present

## 2021-03-06 DIAGNOSIS — Z808 Family history of malignant neoplasm of other organs or systems: Secondary | ICD-10-CM | POA: Insufficient documentation

## 2021-03-06 DIAGNOSIS — Z9221 Personal history of antineoplastic chemotherapy: Secondary | ICD-10-CM | POA: Diagnosis not present

## 2021-03-06 DIAGNOSIS — Z923 Personal history of irradiation: Secondary | ICD-10-CM | POA: Insufficient documentation

## 2021-03-06 DIAGNOSIS — Z86010 Personal history of colonic polyps: Secondary | ICD-10-CM | POA: Insufficient documentation

## 2021-03-06 DIAGNOSIS — Z170 Estrogen receptor positive status [ER+]: Secondary | ICD-10-CM | POA: Insufficient documentation

## 2021-03-06 DIAGNOSIS — Z800 Family history of malignant neoplasm of digestive organs: Secondary | ICD-10-CM | POA: Diagnosis not present

## 2021-03-06 DIAGNOSIS — N6320 Unspecified lump in the left breast, unspecified quadrant: Secondary | ICD-10-CM | POA: Diagnosis not present

## 2021-03-06 DIAGNOSIS — Z7289 Other problems related to lifestyle: Secondary | ICD-10-CM | POA: Diagnosis not present

## 2021-03-06 DIAGNOSIS — C50411 Malignant neoplasm of upper-outer quadrant of right female breast: Secondary | ICD-10-CM

## 2021-03-06 DIAGNOSIS — Z801 Family history of malignant neoplasm of trachea, bronchus and lung: Secondary | ICD-10-CM | POA: Diagnosis not present

## 2021-03-06 DIAGNOSIS — Z818 Family history of other mental and behavioral disorders: Secondary | ICD-10-CM | POA: Insufficient documentation

## 2021-03-06 DIAGNOSIS — Z79899 Other long term (current) drug therapy: Secondary | ICD-10-CM | POA: Diagnosis not present

## 2021-03-06 DIAGNOSIS — Z8371 Family history of colonic polyps: Secondary | ICD-10-CM | POA: Diagnosis not present

## 2021-03-06 DIAGNOSIS — Z79811 Long term (current) use of aromatase inhibitors: Secondary | ICD-10-CM | POA: Insufficient documentation

## 2021-03-06 DIAGNOSIS — N6489 Other specified disorders of breast: Secondary | ICD-10-CM | POA: Diagnosis not present

## 2021-03-06 DIAGNOSIS — Z8679 Personal history of other diseases of the circulatory system: Secondary | ICD-10-CM | POA: Diagnosis not present

## 2021-03-06 DIAGNOSIS — N8500 Endometrial hyperplasia, unspecified: Secondary | ICD-10-CM | POA: Insufficient documentation

## 2021-03-06 LAB — COMPREHENSIVE METABOLIC PANEL
ALT: 13 U/L (ref 0–44)
AST: 18 U/L (ref 15–41)
Albumin: 3.8 g/dL (ref 3.5–5.0)
Alkaline Phosphatase: 64 U/L (ref 38–126)
Anion gap: 8 (ref 5–15)
BUN: 18 mg/dL (ref 8–23)
CO2: 26 mmol/L (ref 22–32)
Calcium: 8.8 mg/dL — ABNORMAL LOW (ref 8.9–10.3)
Chloride: 108 mmol/L (ref 98–111)
Creatinine, Ser: 0.86 mg/dL (ref 0.44–1.00)
GFR, Estimated: >60 mL/min (ref >60)
Glucose, Bld: 86 mg/dL (ref 70–99)
Potassium: 4.6 mmol/L (ref 3.5–5.1)
Sodium: 142 mmol/L (ref 135–145)
Total Bilirubin: 0.5 mg/dL (ref 0.3–1.2)
Total Protein: 6.7 g/dL (ref 6.5–8.1)

## 2021-03-06 NOTE — Progress Notes (Signed)
[place]  [phone] [phone]   ID: Catherine Lawson DOB: [birth year]  MR#: [id number]  YHT#:[id number]  Patient Care Team: Cari Caraway, MD as PCP - General (Family Medicine) Excell Seltzer, MD (Inactive) as Consulting Physician (General Surgery) Dhruti Ghuman, Virgie Dad, MD as Consulting Physician (Oncology) Kyung Rudd, MD as Consulting Physician (Radiation Oncology) Mauro Kaufmann, RN as Registered Nurse Rockwell Germany, RN as Registered Nurse Jake Shark, Johny Blamer, NP as Nurse Practitioner (Nurse Practitioner) Vania Rea, MD as Consulting Physician (Obstetrics and Gynecology) Thompson Grayer, MD as Consulting Physician (Cardiology) OTHER MD:  CHIEF COMPLAINT: Estrogen receptor positive breast cancer  CURRENT TREATMENT: Completing 5 years of antiestrogens   INTERVAL HISTORY: Catherine Lawson returns today for follow-up of her estrogen receptor positive breast cancer.   She continues on tamoxifen.  She has no significant hot flashes or vaginal wetness issues.  She is now 5 years into antiestrogens and has the option of discontinuing which is discussed further below.  She underwent genetic testing on 10/02/2020. Results were negative, with the exception of a variant of uncertain significance in BRCA2 that has reclassified to "likely benign" by other labs.  Of note, she was found to have significant endometrial hyperplasia.  She underwent endometrial curettage on 08/07/20 under Dr. Denman George. Pathology from the procedure [phone]) was benign.   REVIEW OF SYSTEMS.  Catherine Lawson just came back from a trip to the [place] which she greatly enjoyed.  She and her husband like to hike and generally are very active.  Family is doing well.  A detailed review of systems today was otherwise noncontributory   COVID 19 VACCINATION STATUS: [place] x2   BREAST CANCER HISTORY: From the original intake note:     "Catherine Lawson" had routine screening mammography at [facility]  01/24/2015. This showed a 1.6 cm mass in the right breast at the 11:00 position, and on 0.8 cm irregular density also at the 11:00 position farther away from the nipple. On 02/01/2015 she underwent a unilateral right diagnostic mammography at [facility]. The breast density was category B. In addition to the 2 masses previously noted there was a cluster of calcifications at the 9:00 position. Ultrasonography the same day showed a 1.9 cm oval mass, which was hypoechoic with no vascularity and hard on L a Stogner if he, a 7 mm mass at the 10:00 position with no vascularity and intermediate L a Stogner 3, and a lymph node with uniform cortex thickening in the right axilla. The 2 masses in question were separated by 2 cm.  On 02/02/2015 the patient underwent biopsy of what appears to have been the larger of the 2 breast masses (I cannot locate the biopsy report). This documented invasive ductal carcinoma, grade 2 or 3, with micropapillary features, estrogen and progesterone receptor positive, with an MIB-1 of 40%, and HER-2 not amplified with a signals ratio of 1.36 and a number per cell of 3.60. There was evidence of lymphovascular invasion.  Because of bleeding from the first biopsy, the second breast mass could not be performed. The lymph node was aspirated, not cord, and this appeared benign but the concordance is questionable.  The patient's subsequent history is as detailed below   PAST MEDICAL HISTORY: Past Medical History:  Diagnosis Date   Arthritis    bil thumb joints, back   Cervical stenosis (uterine cervix)    Eczema    Family history of adverse reaction to anesthesia    sister-- severe ponv   Family history of breast cancer  Family history of colon cancer    Family history of stomach cancer    History of adenomatous polyp of colon    History of cancer chemotherapy 06-27-2015  to 08-30-2015   right breast cancer   History of external beam radiation therapy 09-26-2015  to 11-10-2015    right breast cancer   History of supraventricular tachycardia (08-03-2020  pt denies any symptoms since 2017   previously followed by cardiology, dr Rayann Heman,  s/p  svt ablation 11-28-2015,  lov note in epic 12-25-2015 pt released as needed;  echo and event monitor in epic 2017   Malignant neoplasm of upper-outer quadrant of right breast in female, estrogen receptor positive [facility]) oncologist--- dr Jana Hakim   dx 11/ 2016--- Stage 1A, Grade 2, IDC, ER/PR positive;  s/p  right lumpectomy w/ sln dissection;  completed chemo 08-30-2015 and radation 11-10-2015   PMB (postmenopausal bleeding)    S/P ablation of ventricular arrhythmia 11/28/2015   Thickened endometrium     PAST SURGICAL HISTORY: Past Surgical History:  Procedure Laterality Date   COLONOSCOPY  last one 11-19-2018   DILATION AND CURETTAGE OF UTERUS N/A 08/07/2020   Procedure: DILATATION AND CURETTAGE OF UTERUS WITH HYSTEROSCOPY, MYOSURE;  Surgeon: [name], MD;  Location: [place];  Service: Gynecology;  Laterality: N/A;   ELECTROPHYSIOLOGIC STUDY N/A 11/28/2015   left lateral accessory pathway and slow pathway ablation performed by Dr Rayann Heman   POLYPECTOMY     PORT-A-CATH REMOVAL  06/2016   PORTACATH PLACEMENT Right 06/27/2015   Procedure: INSERTION PORT-A-CATH;  Surgeon: Excell Seltzer, MD;  Location: [place];  Service: General;  Laterality: Right;   RADIOACTIVE SEED GUIDED PARTIAL MASTECTOMY WITH AXILLARY SENTINEL LYMPH NODE BIOPSY Right 05/29/2015   Procedure: RADIOACTIVE SEED RIGHT BREAST LUMPECTOMY WITH AXILLARY SENTINEL LYMPH NODE BIOPSY;  Surgeon: Excell Seltzer, MD;  Location: [place];  Service: General;  Laterality: Right;   RE-EXCISION OF BREAST LUMPECTOMY Right 06/12/2015   Procedure: RE-EXCISION OF RIGHT  BREAST LUMPECTOMY;  Surgeon: Excell Seltzer, MD;  Location: [facility];  Service: General;  Laterality: Right;   WISDOM TOOTH EXTRACTION      FAMILY  HISTORY Family History  Problem Relation Age of Onset   Breast cancer Sister 40   Colon cancer Father 22   Cancer Maternal Grandmother        probable ovarian cancer   Stomach cancer Paternal Grandmother 36   Brain cancer Other 10       Medulloblastoma   Parkinson's disease Mother    Lung cancer Paternal Uncle        smoker   Stomach cancer Other        PGMs sister   Stomach cancer Other        PMGs brother   Colon polyps Brother    Colon polyps Sister    Breast cancer Sister        DCIS dx early 26s   Prostate cancer Cousin        stage IV dx 51s   Esophageal cancer Neg Hx    Rectal cancer Neg Hx    Uterine cancer Neg Hx    the patient's father is still living, at age 29. The patient's mother died from Parkinson's disease complications at age 69. The patient had 5 brothers, 4 sisters. One sister was diagnosed with breast cancer at age 64 and died at age 53. The patient's father was diagnosed with colon cancer at the age of 39.  A nephew was diagnosed with medulloblastoma at age 71 and died at age 8. The paternal grandmother was diagnosed with stomach cancer at age 76. The maternal grandmother was diagnosed with metastatic cancer of unknown type at age 48. (The description is suggestive of an ovarian cancer).   GYNECOLOGIC HISTORY:  No LMP recorded. Patient is postmenopausal. Menarche age 47. The patient is GX P0. She stopped having periods in 2014. She did not take hormone replacement. She took oral contraceptives for approximately 20 years with no complications.   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 Her husband [name] works for a bank in [place] has 2 daughters from an earlier marriage, Ronalee Belts lives in [place] and works in recruiting, and Larken Urias lives in [place] and works in Press photographer. They have 1 grandchild. The patient attends the [employer] [employer]   HEALTH  MAINTENANCE: Social History   Tobacco Use   Smoking status: Never   Smokeless tobacco: Never  Vaping Use   Vaping Use: Never used  Substance Use Topics   Alcohol use: Yes    Alcohol/week: 4.0 - 5.0 standard drinks    Types: 4 - 5 Glasses of wine per week    Comment: social   Drug use: Never     Colonoscopy: 2014  PAP: 2015  Bone density: 02/12/2018 showed a T score of -2.1  Lipid panel:  Allergies  Allergen Reactions   Other Itching and Dermatitis    Seasonal Allergies    Current Outpatient Medications  Medication Sig Dispense Refill   calcium carbonate (TUMS - DOSED IN MG ELEMENTAL CALCIUM) 500 MG chewable tablet Chew 1 tablet by mouth daily.     cetirizine (ZYRTEC) 10 MG tablet Take 10 mg by mouth daily.     Crisaborole 2 % OINT as needed.     fluocinolone (VANOS) 0.01 % cream Apply topically 2 (two) times daily as needed.     Fluocinolone Acetonide Body 0.01 % OIL SMARTSIG:Sparingly Topical Twice Daily PRN     Multiple Vitamin (MULTIVITAMIN) tablet Take 1 tablet by mouth daily.     Omega-3 Fatty Acids (FISH OIL TRIPLE STRENGTH) 1400 MG CAPS Take 1,400 mg by mouth daily.     Probiotic Product (PROBIOTIC PO) Take by mouth.     scopolamine (TRANSDERM-SCOP) 1 MG/3DAYS APPLY 1 PATCH ON SKIN BEHIND EAR AS NEEDED     scopolamine (TRANSDERM-SCOP) 1 MG/3DAYS 1 patch to skin behind the ear as needed     tamoxifen (NOLVADEX) 20 MG tablet TAKE 1 TABLET BY MOUTH EVERY DAY 90 tablet 3   TURMERIC PO Take by mouth daily.     No current facility-administered medications for this visit.    OBJECTIVE: White woman who appears younger than stated age  60:   03/06/21 1155  BP: 129/62  Pulse: 69  Resp: 16  Temp: 97.6 F (36.4 C)  SpO2: 99%    Body mass index is 24.55 kg/m.     ECOG FS:1 - Symptomatic but completely ambulatory  Sclerae unicteric, EOMs intact Wearing a mask No cervical or supraclavicular adenopathy Lungs no rales or rhonchi Heart regular rate and  rhythm Abd soft, nontender, positive bowel sounds MSK no focal spinal tenderness, no upper extremity lymphedema Neuro: nonfocal, well oriented, appropriate affect Breasts: The right breast has undergone lumpectomy followed by radiation with no evidence of disease recurrence.  The left breast is benign.  Both axillae  are benign.   LAB RESULTS:  CMP     Component Value Date/Time   NA 139 10/02/2020 1346   NA 140 01/28/2017 1312   K 4.2 10/02/2020 1346   K 5.2 (H) 01/28/2017 1312   CL 104 10/02/2020 1346   CO2 27 10/02/2020 1346   CO2 24 01/28/2017 1312   GLUCOSE 92 10/02/2020 1346   GLUCOSE 91 01/28/2017 1312   BUN 16 10/02/2020 1346   BUN 17.3 01/28/2017 1312   CREATININE 0.96 10/02/2020 1346   CREATININE 0.8 01/28/2017 1312   CALCIUM 9.4 10/02/2020 1346   CALCIUM 9.7 01/28/2017 1312   PROT 7.4 10/02/2020 1346   PROT 6.9 01/28/2017 1312   ALBUMIN 4.0 10/02/2020 1346   ALBUMIN 3.9 01/28/2017 1312   AST 28 10/02/2020 1346   AST 24 01/28/2017 1312   ALT 25 10/02/2020 1346   ALT 20 01/28/2017 1312   ALKPHOS 84 10/02/2020 1346   ALKPHOS 86 01/28/2017 1312   BILITOT 0.6 10/02/2020 1346   BILITOT 0.56 01/28/2017 1312   GFRNONAA >60 10/02/2020 1346   GFRAA >60 03/06/2020 1026    INo results found for: SPEP, UPEP  Lab Results  Component Value Date   WBC 5.5 03/06/2020   NEUTROABS 2.9 03/06/2020   HGB 12.4 03/06/2020   HCT 37.7 03/06/2020   MCV 88.9 03/06/2020   PLT 234 03/06/2020      Chemistry      Component Value Date/Time   NA 139 10/02/2020 1346   NA 140 01/28/2017 1312   K 4.2 10/02/2020 1346   K 5.2 (H) 01/28/2017 1312   CL 104 10/02/2020 1346   CO2 27 10/02/2020 1346   CO2 24 01/28/2017 1312   BUN 16 10/02/2020 1346   BUN 17.3 01/28/2017 1312   CREATININE 0.96 10/02/2020 1346   CREATININE 0.8 01/28/2017 1312      Component Value Date/Time   CALCIUM 9.4 10/02/2020 1346   CALCIUM 9.7 01/28/2017 1312   ALKPHOS 84 10/02/2020 1346   ALKPHOS 86  01/28/2017 1312   AST 28 10/02/2020 1346   AST 24 01/28/2017 1312   ALT 25 10/02/2020 1346   ALT 20 01/28/2017 1312   BILITOT 0.6 10/02/2020 1346   BILITOT 0.56 01/28/2017 1312       No results found for: LABCA2  No components found for: LABCA125  No results for input(s): INR in the last 168 hours.  Urinalysis No results found for: COLORURINE, APPEARANCEUR, LABSPEC, PHURINE, GLUCOSEU, HGBUR, BILIRUBINUR, KETONESUR, PROTEINUR, UROBILINOGEN, NITRITE, LEUKOCYTESUR   STUDIES: No results found.   ASSESSMENT: 65 y.o. [place] woman status post right breast upper outer quadrant biopsy 02/02/2015 for a clinically multifocal T1c NX, stage IA invasive ductal carcinoma, grade 2 or 3, estrogen and progesterone receptor positive, with HER-2 not amplified and the MIB-1 at 40%  (1) biopsy of a suspicious left breast mass 03/28/2015 showed only usual ductal hyperplasia, no malignancy identified.  (2) genetics testing 03/06/2015 through the Breast/Ovarian gene panel offered by [facility] found no deleterious mutations in ATM, BARD1, BRCA1, BRCA2, BRIP1, CDH1, CHEK2, EPCAM, FANCC, MLH1, MSH2, MSH6, NBN, PALB2, PMS2, PTEN, RAD51C, RAD51D, TP53, and XRCC2.    (a) there was a Variant of Unknown Significance in the BRCA2 gene called [id number]  (3) status post right lumpectomy and sentinel lymph node sampling 05/29/2015 for an mpT1b pN0, stage IA invasive ductal carcinoma, grade 2, with positive margins  (a) margins cleared with subsequent surgery 06/12/2015  (3) Oncotype score of 35 predicts a risk  of outside the breast recurrence within 10 years of 24% if the patient's only systemic therapy is tamoxifen for 5 years. It also predicts significant benefit from chemotherapy.  (4) adjuvant chemotherapy consisting of cyclophosphamide and docetaxel 4, given 3 weeks apart, starting 06/27/2015, completed 08/30/2015  (5) FISH panel returned HER-2 positive. Receive trastuzumab starting 08/09/2015, last dose  07/05/2016  (a) echocardiogram 05/28/2016 showed a well-preserved ejection fraction at 60-65%.  (6) adjuvant radiation 09/26/2015 through 11/10/2015:  Site/dose:   The patient initially received a dose of 50.4 Gy in 28 fractions to the breast using whole-breast tangent fields. This was delivered using a 3-D conformal technique. The patient then received a boost to the seroma. This delivered an additional 10 Gy in 5 fractions using a electron field. The total dose was 60.4 Gy.  (7) received anastrozole 02/08/2015 through December 2016, resumed at the completion of radiation April 2017  (a) D[facility] 03/28/2015 showed osteopenia, with a T score of -2.1  (b) bone density 02/12/2018 showed a T-score of -2.1  (c) stopped anastrozole 01/11/2018  (d) repeat bone density August 2021 showed a T score of -1.5  (8) started tamoxifen August 2019, completing 5 years of antiestrogens August 2022   PLAN: Catherine Lawson is now nearly 6 years out from definitive surgery for her breast cancer with no evidence of disease recurrence.  This is very favorable.  She has developed endometrial hyperplasia on the tamoxifen so in any case we need to discontinue that medication.  The issue is whether we should go back to anastrozole and I continue antiestrogens longer.  We discussed the possibility of the breast cancer index which of course can give [place] information on on residual risk and possible benefit.  We also reviewed her entire situation.  She is very aware that everything has a downside and if the breast cancer index work to suggest that she would benefit to some extent from continuing antiestrogens, then she would need to deal with the side effects of anastrozole which include importantly vaginal dryness issues and osteopenia/osteoporosis.  Accordingly even if she could obtain a slight decrease in risk by continuing antiestrogens she would prefer not to and so she is "graduating" today.  At this point I feel  comfortable releasing her to her primary care physicians.  All she will need in terms of breast cancer follow-up is her yearly screening mammography and a yearly physician breast exam.  I will be glad to see Catherine Lawson again at any point in the future if and when the need arises but as of now are making no further routine appointments for her here.  Total encounter time 25 minutes.*   [place] Hoback, Virgie Dad, MD  03/06/21 12:06 PM Medical Oncology and Hematology [facility] [place],  [id number] Tel. [phone]    Fax. [phone]   I, Wilburn Mylar, am acting as scribe for Dr. Virgie Dad. Catherine Lawson.  I, Lurline Del MD, have reviewed the above documentation for accuracy and completeness, and I agree with the above.   *Total Encounter Time as defined by the Centers for Medicare and Medicaid Services includes, in addition to the face-to-face time of a patient visit (documented in the note above) non-face-to-face time: obtaining and reviewing outside history, ordering and reviewing medications, tests or procedures, care coordination (communications with other health care professionals or caregivers) and documentation in the medical record.

## 2021-05-04 ENCOUNTER — Other Ambulatory Visit: Payer: Self-pay | Admitting: Oncology

## 2021-05-19 ENCOUNTER — Other Ambulatory Visit: Payer: Self-pay | Admitting: Nurse Practitioner

## 2021-06-18 DIAGNOSIS — Z0000 Encounter for general adult medical examination without abnormal findings: Secondary | ICD-10-CM | POA: Diagnosis not present

## 2021-06-18 DIAGNOSIS — Z13220 Encounter for screening for lipoid disorders: Secondary | ICD-10-CM | POA: Diagnosis not present

## 2021-06-18 DIAGNOSIS — Z23 Encounter for immunization: Secondary | ICD-10-CM | POA: Diagnosis not present

## 2021-06-18 DIAGNOSIS — M85852 Other specified disorders of bone density and structure, left thigh: Secondary | ICD-10-CM | POA: Diagnosis not present

## 2021-07-19 ENCOUNTER — Telehealth: Payer: Self-pay

## 2021-07-19 NOTE — Telephone Encounter (Signed)
Received call from Ms. Craddock requesting a follow up appointment with Dr. Berline Lopes. Patient reports she has been off tamoxifen for 6 months. She states she was instructed to call to follow up re: post menopausal bleeding and thickening. Appointment scheduled for 08/27/21 at 3:45pm. Patient is in agreement of appointment date and time.

## 2021-08-27 ENCOUNTER — Encounter: Payer: Self-pay | Admitting: Gynecologic Oncology

## 2021-08-27 ENCOUNTER — Other Ambulatory Visit: Payer: Self-pay

## 2021-08-27 ENCOUNTER — Inpatient Hospital Stay: Payer: BC Managed Care – PPO | Attending: Gynecologic Oncology | Admitting: Gynecologic Oncology

## 2021-08-27 VITALS — BP 154/61 | HR 96 | Temp 98.3°F | Resp 18 | Ht 64.96 in | Wt 151.2 lb

## 2021-08-27 DIAGNOSIS — N950 Postmenopausal bleeding: Secondary | ICD-10-CM | POA: Diagnosis not present

## 2021-08-27 DIAGNOSIS — Z853 Personal history of malignant neoplasm of breast: Secondary | ICD-10-CM | POA: Diagnosis not present

## 2021-08-27 DIAGNOSIS — R9389 Abnormal findings on diagnostic imaging of other specified body structures: Secondary | ICD-10-CM | POA: Diagnosis not present

## 2021-08-27 NOTE — Progress Notes (Signed)
Gynecologic Oncology Return Clinic Visit  08/27/2021  Reason for Visit: Follow-up in the setting of history of breast cancer, previously on tamoxifen  Treatment History: Oncology History  Malignant neoplasm of upper-outer quadrant of right breast in female, estrogen receptor positive ([place])  02/06/2015 Initial Diagnosis   Breast cancer of upper-outer quadrant of right female breast    [name] is a 66 year old P0 who was seen in consultation at the request of Dr Catherine Lawson for evaluation of postmenopausal bleeding and thickened endometrium after failed office sampling attempts.   The patient has a history of ER/PR positive breast cancer diagnosed in 2016 in the right breast.  This was stage I and treated with chemotherapy, lumpectomy, and postoperative aromatase inhibitor followed by tamoxifen due to intolerance.  She had planned duration of tamoxifen therapy until 2023.   She developed symptoms of a single episode of postmenopausal bleeding in September 2021.  She visited her gynecologist Dr. Stann Lawson for a symptom directed visit at this time.  Of note the patient reported a remote history of cervical dysplasia in 1997 treated with cryotherapy.  She is known to have had normal Pap tests since that time with negative high-risk HPV.   On April 10, 2020 when she was seen by Dr. Stann Lawson for this new symptom of postmenopausal bleeding, a transvaginal ultrasound scan was performed in the office on that day, which revealed a uterus measuring 7.7 x 4.1 x 5.8 cm.  There was a separate partially exophytic myometrial mass measuring about 4 cm which was likely a fibroid.  The endometrium was thickened at 18 mm.  The ovaries were grossly normal bilaterally and well visualized.  There was no free fluid.   Office endometrial [place] sampling was attempted the same day and revealed only scant atrophic endometrium and minute fragments of benign endocervical glands due to cervical stenosis.   She was referred to  Dr. Gwynne Lawson partner for office hysteroscopy and D&C which was attempted on June 26, 2020.  She had been prescribed and administered misoprostol 4 hours preoperatively.  Cervical stenosis prevented adequate entry into the endometrial cavity.  Endometrial curettage was performed blindly which revealed minute fragments of benign endocervical glands.  D&C myosure on 08/07/20 performed by Dr Catherine Lawson was benign for a benign endometrioid polyp.   Interval History: Patient reports doing well.  She discontinued tamoxifen under her medical oncologist direction in August after completing 5 years of hormonal therapy.  She denies any further bleeding or discharge after her procedure a year ago.  She denies any pelvic pain or cramping.  She saw her OBGYN over the summer last year and had her Pap smear updated.  She is also met with genetics since her last visit with Dr. Denman Lawson.  Repeat genetic testing was performed with no pathogenic mutations identified.  There was a variant of uncertain significance identified, which is now felt to likely be of a benign variant.  Past Medical/Surgical History: Past Medical History:  Diagnosis Date   Arthritis    bil thumb joints, back   Cervical stenosis (uterine cervix)    Eczema    Family history of adverse reaction to anesthesia    sister-- severe ponv   Family history of breast cancer    Family history of colon cancer    Family history of stomach cancer    History of adenomatous polyp of colon    History of cancer chemotherapy 06-27-2015  to 08-30-2015   right breast cancer   History of  external beam radiation therapy 09-26-2015  to 11-10-2015   right breast cancer   History of supraventricular tachycardia (08-03-2020  pt denies any symptoms since 2017   previously followed by cardiology, dr Catherine Lawson,  s/p  svt ablation 11-28-2015,  lov note in epic 12-25-2015 pt released as needed;  echo and event monitor in epic 2017   Malignant neoplasm of upper-outer quadrant  of right breast in female, estrogen receptor positive [facility]) oncologist--- dr Catherine Lawson   dx 11/ 2016--- Stage 1A, Grade 2, IDC, ER/PR positive;  s/p  right lumpectomy w/ sln dissection;  completed chemo 08-30-2015 and radation 11-10-2015   PMB (postmenopausal bleeding)    S/P ablation of ventricular arrhythmia 11/28/2015   Thickened endometrium     Past Surgical History:  Procedure Laterality Date   COLONOSCOPY  last one 11-19-2018   DILATION AND CURETTAGE OF UTERUS N/A 08/07/2020   Procedure: DILATATION AND CURETTAGE OF UTERUS WITH HYSTEROSCOPY, MYOSURE;  Surgeon: Catherine Mosses, MD;  Location: [place];  Service: Gynecology;  Laterality: N/A;   ELECTROPHYSIOLOGIC STUDY N/A 11/28/2015   left lateral accessory pathway and slow pathway ablation performed by Dr Catherine Lawson   POLYPECTOMY     PORT-A-CATH REMOVAL  06/2016   PORTACATH PLACEMENT Right 06/27/2015   Procedure: INSERTION PORT-A-CATH;  Surgeon: Catherine Seltzer, MD;  Location: [place];  Service: General;  Laterality: Right;   RADIOACTIVE SEED GUIDED PARTIAL MASTECTOMY WITH AXILLARY SENTINEL LYMPH NODE BIOPSY Right 05/29/2015   Procedure: RADIOACTIVE SEED RIGHT BREAST LUMPECTOMY WITH AXILLARY SENTINEL LYMPH NODE BIOPSY;  Surgeon: Catherine Seltzer, MD;  Location: [place];  Service: General;  Laterality: Right;   RE-EXCISION OF BREAST LUMPECTOMY Right 06/12/2015   Procedure: RE-EXCISION OF RIGHT  BREAST LUMPECTOMY;  Surgeon: Catherine Seltzer, MD;  Location: [facility];  Service: General;  Laterality: Right;   WISDOM TOOTH EXTRACTION      Family History  Problem Relation Age of Onset   Breast cancer Sister 34   Colon cancer Father 28   Cancer Maternal Grandmother        probable ovarian cancer   Stomach cancer Paternal Grandmother 68   Brain cancer Other 10       Medulloblastoma   Parkinson's disease Mother    Lung cancer Paternal Uncle        smoker   Stomach cancer Other         PGMs sister   Stomach cancer Other        PMGs brother   Colon polyps Brother    Colon polyps Sister    Breast cancer Sister        DCIS dx early 61s   Prostate cancer Cousin        stage IV dx 47s   Esophageal cancer Neg Hx    Rectal cancer Neg Hx    Uterine cancer Neg Hx     Social History   Socioeconomic History   Marital status: Married    Spouse name: Event organiser   Number of children: 0   Years of education: Not on file   Highest education level: Not on file  Occupational History   Not on file  Tobacco Use   Smoking status: Never   Smokeless tobacco: Never  Vaping Use   Vaping Use: Never used  Substance and Sexual Activity   Alcohol use: Yes    Alcohol/week: 4.0 - 5.0 standard drinks    Types: 4 - 5 Glasses of wine per week  Comment: social   Drug use: Never   Sexual activity: Not on file  Other Topics Concern   Not on file  Social History Narrative   Lives in [place]   Retired   married   Social Determinants of Health   Financial Resource Strain: Not on file  Food Insecurity: Not on file  Transportation Needs: Not on file  Physical Activity: Not on file  Stress: Not on file  Social Connections: Not on file    Current Medications:  Current Outpatient Medications:    cetirizine (ZYRTEC) 10 MG tablet, Take 10 mg by mouth daily., Disp: , Rfl:    Crisaborole 2 % OINT, as needed., Disp: , Rfl:    fluocinolone (VANOS) 0.01 % cream, Apply topically 2 (two) times daily as needed., Disp: , Rfl:    Multiple Vitamin (MULTIVITAMIN) tablet, Take 1 tablet by mouth daily., Disp: , Rfl:    Omega-3 Fatty Acids (FISH OIL TRIPLE STRENGTH) 1400 MG CAPS, Take 1,400 mg by mouth daily., Disp: , Rfl:    Probiotic Product (PROBIOTIC PO), Take by mouth., Disp: , Rfl:    tamoxifen (NOLVADEX) 20 MG tablet, TAKE 1 TABLET BY MOUTH EVERY DAY, Disp: 90 tablet, Rfl: 3   TURMERIC PO, Take by mouth daily., Disp: , Rfl:   Review of Systems: Denies appetite changes, fevers, chills,  fatigue, unexplained weight changes. Denies hearing loss, neck lumps or masses, mouth sores, ringing in ears or voice changes. Denies cough or wheezing.  Denies shortness of breath. Denies chest pain or palpitations. Denies leg swelling. Denies abdominal distention, pain, blood in stools, constipation, diarrhea, nausea, vomiting, or early satiety. Denies pain with intercourse, dysuria, frequency, hematuria or incontinence. Denies hot flashes, pelvic pain, vaginal bleeding or vaginal discharge.   Denies joint pain, back pain or muscle pain/cramps. Denies itching, rash, or wounds. Denies dizziness, headaches, numbness or seizures. Denies swollen lymph nodes or glands, denies easy bruising or bleeding. Denies anxiety, depression, confusion, or decreased concentration.  Physical Exam: BP (!) 154/61 (BP Location: Left Arm, Patient Position: Sitting)    Pulse 96    Temp 98.3 F (36.8 C) (Oral)    Resp 18    Ht 5' 4.96" (1.65 m)    Wt 151 lb 3.2 oz (68.6 kg)    SpO2 98%    BMI 25.19 kg/m  General: Alert, oriented, no acute distress. HEENT: Normocephalic, atraumatic, sclera anicteric. Chest: Unlabored breathing on room air.  Laboratory & Radiologic Studies: None new  Assessment & Plan: Catherine Lawson is a 66 y.o. woman who developed postmenopausal bleeding on tamoxifen for her history of estrogen receptor positive breast cancer, status post hysteroscopy and resection of endometrial lesion in 07/2020 with benign pathology, presenting today for follow-up.  Patient has now been off tamoxifen for almost 6 months.  She has had no further recurrence of bleeding or any other pelvic symptoms since her procedure over a year ago.  I do not feel strongly that she needs any sort of additional evaluation including pelvic ultrasound or endometrial biopsy.  We discussed precautions and I urged her to call me if she develops any additional postmenopausal bleeding, pelvic pain or cramping, or other concerning  symptoms.  We will otherwise follow-up with her GYN and PCP.  Reviewed her updated genetic testing with a VUS that is thought to likely be a benign variant.  28 minutes of total time was spent for this patient encounter, including preparation, face-to-face counseling with the patient and coordination of care, and  documentation of the encounter.  Jeral Pinch, MD  Division of Gynecologic Oncology  Department of Obstetrics and Gynecology  [facility]

## 2021-08-27 NOTE — Patient Instructions (Signed)
It was good to see you today.  I recommend continue follow-up with your OB/GYN and PCP.  If you were to develop any vaginal bleeding, pelvic pain or cramping, please call to see me.

## 2021-09-05 DIAGNOSIS — J3089 Other allergic rhinitis: Secondary | ICD-10-CM | POA: Diagnosis not present

## 2021-09-05 DIAGNOSIS — L2089 Other atopic dermatitis: Secondary | ICD-10-CM | POA: Diagnosis not present

## 2021-09-05 DIAGNOSIS — J300 Vasomotor rhinitis: Secondary | ICD-10-CM | POA: Diagnosis not present

## 2021-09-05 DIAGNOSIS — H1045 Other chronic allergic conjunctivitis: Secondary | ICD-10-CM | POA: Diagnosis not present

## 2021-10-02 DIAGNOSIS — L821 Other seborrheic keratosis: Secondary | ICD-10-CM | POA: Diagnosis not present

## 2021-10-02 DIAGNOSIS — L814 Other melanin hyperpigmentation: Secondary | ICD-10-CM | POA: Diagnosis not present

## 2021-10-02 DIAGNOSIS — L578 Other skin changes due to chronic exposure to nonionizing radiation: Secondary | ICD-10-CM | POA: Diagnosis not present

## 2021-10-02 DIAGNOSIS — D225 Melanocytic nevi of trunk: Secondary | ICD-10-CM | POA: Diagnosis not present

## 2021-12-24 DIAGNOSIS — Z6824 Body mass index (BMI) 24.0-24.9, adult: Secondary | ICD-10-CM | POA: Diagnosis not present

## 2021-12-24 DIAGNOSIS — R300 Dysuria: Secondary | ICD-10-CM | POA: Diagnosis not present

## 2022-01-07 DIAGNOSIS — R300 Dysuria: Secondary | ICD-10-CM | POA: Diagnosis not present

## 2022-02-27 DIAGNOSIS — Z1231 Encounter for screening mammogram for malignant neoplasm of breast: Secondary | ICD-10-CM | POA: Diagnosis not present

## 2022-02-28 ENCOUNTER — Encounter: Payer: Self-pay | Admitting: Gastroenterology

## 2022-03-20 DIAGNOSIS — N905 Atrophy of vulva: Secondary | ICD-10-CM | POA: Diagnosis not present

## 2022-03-20 DIAGNOSIS — Z01419 Encounter for gynecological examination (general) (routine) without abnormal findings: Secondary | ICD-10-CM | POA: Diagnosis not present

## 2022-03-27 ENCOUNTER — Encounter: Payer: Self-pay | Admitting: Gastroenterology

## 2022-04-15 DIAGNOSIS — M85832 Other specified disorders of bone density and structure, left forearm: Secondary | ICD-10-CM | POA: Diagnosis not present

## 2022-05-06 ENCOUNTER — Ambulatory Visit (AMBULATORY_SURGERY_CENTER): Payer: Self-pay

## 2022-05-06 VITALS — Ht 65.0 in | Wt 153.0 lb

## 2022-05-06 DIAGNOSIS — Z86010 Personal history of colonic polyps: Secondary | ICD-10-CM

## 2022-05-06 DIAGNOSIS — Z800 Family history of malignant neoplasm of digestive organs: Secondary | ICD-10-CM

## 2022-05-06 MED ORDER — NA SULFATE-K SULFATE-MG SULF 17.5-3.13-1.6 GM/177ML PO SOLN
1.0000 | ORAL | 0 refills | Status: DC
Start: 2022-05-06 — End: 2022-06-10

## 2022-05-06 NOTE — Progress Notes (Signed)

## 2022-05-30 ENCOUNTER — Encounter: Payer: Self-pay | Admitting: Gastroenterology

## 2022-06-10 ENCOUNTER — Ambulatory Visit (AMBULATORY_SURGERY_CENTER): Payer: Medicare Other | Admitting: Gastroenterology

## 2022-06-10 ENCOUNTER — Encounter: Payer: Self-pay | Admitting: Gastroenterology

## 2022-06-10 VITALS — BP 132/61 | HR 58 | Temp 98.0°F | Resp 12 | Ht 65.0 in | Wt 153.0 lb

## 2022-06-10 DIAGNOSIS — Z09 Encounter for follow-up examination after completed treatment for conditions other than malignant neoplasm: Secondary | ICD-10-CM

## 2022-06-10 DIAGNOSIS — Z800 Family history of malignant neoplasm of digestive organs: Secondary | ICD-10-CM

## 2022-06-10 DIAGNOSIS — Z86010 Personal history of colonic polyps: Secondary | ICD-10-CM

## 2022-06-10 MED ORDER — SODIUM CHLORIDE 0.9 % IV SOLN: 500.0000 mL | Freq: Once | INTRAVENOUS | Status: DC

## 2022-06-10 NOTE — Progress Notes (Signed)
Pt's states no medical or surgical changes since previsit or office visit. 

## 2022-06-10 NOTE — Progress Notes (Signed)
History & Physical  Primary Care Physician:  Cari Caraway, MD Primary Gastroenterologist: Lucio Edward, MD  CHIEF COMPLAINT:  [facility], Personal history of colon polyps   HPI: Catherine Lawson is a 66 y.o. female with personal history adenomatous and sessile serrated polyps and family history of colon cancer.    Past Medical History:  Diagnosis Date   Arthritis    bil thumb joints, back   Cervical stenosis (uterine cervix)    Eczema    Family history of adverse reaction to anesthesia    sister-- severe ponv   Family history of breast cancer    Family history of colon cancer    Family history of stomach cancer    History of adenomatous polyp of colon    History of cancer chemotherapy 06-27-2015  to 08-30-2015   right breast cancer   History of external beam radiation therapy 09-26-2015  to 11-10-2015   right breast cancer   History of supraventricular tachycardia (08-03-2020  pt denies any symptoms since 2017   previously followed by cardiology, dr Rayann Heman,  s/p  svt ablation 11-28-2015,  lov note in epic 12-25-2015 pt released as needed;  echo and event monitor in epic 2017   Malignant neoplasm of upper-outer quadrant of right breast in female, estrogen receptor positive [facility]) oncologist--- dr Jana Hakim   dx 11/ 2016--- Stage 1A, Grade 2, IDC, ER/PR positive;  s/p  right lumpectomy w/ sln dissection;  completed chemo 08-30-2015 and radation 11-10-2015   PMB (postmenopausal bleeding)    S/P ablation of ventricular arrhythmia 11/28/2015   Thickened endometrium     Past Surgical History:  Procedure Laterality Date   COLONOSCOPY  last one 11-19-2018   DILATION AND CURETTAGE OF UTERUS N/A 08/07/2020   Procedure: DILATATION AND CURETTAGE OF UTERUS WITH HYSTEROSCOPY, MYOSURE;  Surgeon: [name], MD;  Location: [place];  Service: Gynecology;  Laterality: N/A;   ELECTROPHYSIOLOGIC STUDY N/A 11/28/2015   left lateral accessory pathway and slow pathway  ablation performed by Dr Rayann Heman   POLYPECTOMY     PORT-A-CATH REMOVAL  06/2016   PORTACATH PLACEMENT Right 06/27/2015   Procedure: INSERTION PORT-A-CATH;  Surgeon: Excell Seltzer, MD;  Location: [place];  Service: General;  Laterality: Right;   RADIOACTIVE SEED GUIDED PARTIAL MASTECTOMY WITH AXILLARY SENTINEL LYMPH NODE BIOPSY Right 05/29/2015   Procedure: RADIOACTIVE SEED RIGHT BREAST LUMPECTOMY WITH AXILLARY SENTINEL LYMPH NODE BIOPSY;  Surgeon: Excell Seltzer, MD;  Location: [place];  Service: General;  Laterality: Right;   RE-EXCISION OF BREAST LUMPECTOMY Right 06/12/2015   Procedure: RE-EXCISION OF RIGHT  BREAST LUMPECTOMY;  Surgeon: Excell Seltzer, MD;  [facility];  Service: General;  Laterality: Right;   WISDOM TOOTH EXTRACTION      Prior to Admission medications   Medication Sig Start Date End Date Taking? Authorizing Provider  calcium citrate (CALCITRATE - DOSED IN MG ELEMENTAL CALCIUM) 950 (200 Ca) MG tablet Take 200 mg of elemental calcium by mouth daily.   Yes [provider]  cetirizine (ZYRTEC) 10 MG tablet Take 10 mg by mouth daily.   Yes [provider]  Multiple Vitamin (MULTIVITAMIN) tablet Take 1 tablet by mouth daily.   Yes [provider]  Omega-3 Fatty Acids (FISH OIL TRIPLE STRENGTH) 1400 MG CAPS Take 1,400 mg by mouth daily.   Yes [provider]  PREBIOTIC PRODUCT PO Take by mouth.   Yes [provider]  Probiotic Product (PROBIOTIC PO) Take by mouth.  Yes [provider]  Crisaborole 2 % OINT as needed.    [provider]  fluocinolone (SYNALAR) 0.01 % external solution Apply topically.    [provider]  TURMERIC PO Take by mouth daily.    [provider]    Current Outpatient Medications  Medication Sig Dispense Refill   calcium citrate (CALCITRATE - DOSED IN MG ELEMENTAL CALCIUM) 950 (200 Ca) MG tablet Take 200 mg of elemental  calcium by mouth daily.     cetirizine (ZYRTEC) 10 MG tablet Take 10 mg by mouth daily.     Multiple Vitamin (MULTIVITAMIN) tablet Take 1 tablet by mouth daily.     Omega-3 Fatty Acids (FISH OIL TRIPLE STRENGTH) 1400 MG CAPS Take 1,400 mg by mouth daily.     PREBIOTIC PRODUCT PO Take by mouth.     Probiotic Product (PROBIOTIC PO) Take by mouth.     Crisaborole 2 % OINT as needed.     fluocinolone (SYNALAR) 0.01 % external solution Apply topically.     TURMERIC PO Take by mouth daily.     Current Facility-Administered Medications  Medication Dose Route Frequency Provider Last Rate Last Admin   0.9 %  sodium chloride infusion  500 mL Intravenous Once Ladene Artist, MD        Allergies as of 06/10/2022 - Review Complete 06/10/2022  Allergen Reaction Noted   Other Itching and Dermatitis 07/05/2020    Family History  Problem Relation Age of Onset   Breast cancer Sister 32   Colon cancer Father 85   Cancer Maternal Grandmother        probable ovarian cancer   Stomach cancer Paternal Grandmother 45   Brain cancer Other 10       Medulloblastoma   Parkinson's disease Mother    Lung cancer Paternal Uncle        smoker   Stomach cancer Other        PGMs sister   Stomach cancer Other        PMGs brother   Colon polyps Brother    Colon polyps Sister    Breast cancer Sister        DCIS dx early 39s   Prostate cancer Cousin        stage IV dx 22s   Esophageal cancer Neg Hx    Rectal cancer Neg Hx    Uterine cancer Neg Hx     Social History   Socioeconomic History   Marital status: Married    Spouse name: Event organiser   Number of children: 0   Years of education: Not on file   Highest education level: Not on file  Occupational History   Not on file  Tobacco Use   Smoking status: Never   Smokeless tobacco: Never  Vaping Use   Vaping Use: Never used  Substance and Sexual Activity   Alcohol use: Yes    Alcohol/week: 4.0 - 5.0 standard drinks of alcohol    Types: 4 - 5  Glasses of wine per week    Comment: social   Drug use: Never   Sexual activity: Not on file  Other Topics Concern   Not on file  Social History Narrative   Lives in [place]   Retired   married   Social Determinants of Health   Financial Resource Strain: Not on file  Food Insecurity: Not on file  Transportation Needs: Not on file  Physical Activity: Not on file  Stress: Not on file  Social Connections: Not on file  Intimate Partner Violence: Not on file    Review of Systems:  All systems reviewed were negative except where noted in HPI.   Physical Exam: General:  Alert, well-developed, in NAD Head:  Normocephalic and atraumatic. Eyes:  Sclera clear, no icterus.   Conjunctiva pink. Ears:  Normal auditory acuity. Mouth:  No deformity or lesions.  Neck:  Supple; no masses . Lungs:  Clear throughout to auscultation.   No wheezes, crackles, or rhonchi. No acute distress. Heart:  Regular rate and rhythm; no murmurs. Abdomen:  Soft, nondistended, nontender. No masses, hepatomegaly. No obvious masses.  Normal bowel .    Rectal:  Deferred   Msk:  Symmetrical without gross deformities.. Pulses:  Normal pulses noted. Extremities:  Without edema. Neurologic:  Alert and  oriented x4;  grossly normal neurologically. Skin:  Intact without significant lesions or rashes. Cervical Nodes:  No significant cervical adenopathy. Psych:  Alert and cooperative. Normal mood and affect.  Impression / Plan:   Personal history adenomatous and sessile serrated polyps and family history of colon cancer.   Pricilla Riffle. Fuller Plan  06/10/2022, 8:53 AM See Shea Evans, [facility], to contact our on call provider

## 2022-06-10 NOTE — Op Note (Signed)
[place] Patient Name: Catherine Lawson Procedure Date: 06/10/2022 8:59 AM MRN: [id number] Endoscopist: Ladene Artist , MD, [id number] Age: 66 Referring MD:  Date of Birth: [birth year] Gender: Female Account #: [id number] Procedure:                Colonoscopy Indications:              High risk colon cancer surveillance: Personal                            history of sessile serrated colon polyp (10 mm or                            greater in size) and adenomatous colon polyps,                            Family history of colon cancer, 1st-degree relative Medicines:                Monitored Anesthesia Care Procedure:                Pre-Anesthesia Assessment:                           - Prior to the procedure, a History and Physical                            was performed, and patient medications and                            allergies were reviewed. The patient's tolerance of                            previous anesthesia was also reviewed. The risks                            and benefits of the procedure and the sedation                            options and risks were discussed with the patient.                            All questions were answered, and informed consent                            was obtained. Prior Anticoagulants: The patient has                            taken no anticoagulant or antiplatelet agents. ASA                            Grade Assessment: II - A patient with mild systemic                            disease. After reviewing the risks and benefits,  the patient was deemed in satisfactory condition to                            undergo the procedure.                           After obtaining informed consent, the colonoscope                            was passed under direct vision. Throughout the                            procedure, the patient's blood pressure, pulse, and                            oxygen  saturations were monitored continuously. The                            Olympus PCF-H190DL ([id number]) Colonoscope was                            introduced through the anus and advanced to the the                            cecum, identified by appendiceal orifice and                            ileocecal valve. The ileocecal valve, appendiceal                            orifice, and rectum were photographed. The quality                            of the bowel preparation was excellent. The                            colonoscopy was performed without difficulty. The                            patient tolerated the procedure well. Scope In: 9:05:00 AM Scope Out: 9:20:21 AM Scope Withdrawal Time: 0 hours 10 minutes 40 seconds  Total Procedure Duration: 0 hours 15 minutes 21 seconds  Findings:                 The perianal and digital rectal examinations were                            normal.                           Multiple medium-mouthed diverticula were found in                            the entire colon.  A tattoo was seen in the transverse colon. The                            tattoo site appeared normal.                           The exam was otherwise without abnormality on                            direct and retroflexion views. Complications:            No immediate complications. Estimated blood loss:                            None. Estimated Blood Loss:     Estimated blood loss: none. Impression:               - Diverticulosis in the entire examined colon.                           - A tattoo was seen in the transverse colon. The                            tattoo site appeared normal.                           - The examination was otherwise normal on direct                            and retroflexion views.                           - No specimens collected. Recommendation:           - Repeat colonoscopy in 5 years for surveillance.                            - Patient has a contact number available for                            emergencies. The signs and symptoms of potential                            delayed complications were discussed with the                            patient. Return to normal activities tomorrow.                            Written discharge instructions were provided to the                            patient.                           - High fiber diet.                           -  Continue present medications. Ladene Artist, MD 06/10/2022 9:24:16 AM This report has been signed electronically.

## 2022-06-10 NOTE — Patient Instructions (Addendum)
Handout on diverticulosis given. Repeat colonoscopy in 5 year. High fiber diet. Resume present medications   YOU HAD AN ENDOSCOPIC PROCEDURE TODAY AT [facility]:   Refer to the procedure report that was given to you for any specific questions about what was found during the examination.  If the procedure report does not answer your questions, please call your gastroenterologist to clarify.  If you requested that your care partner not be given the details of your procedure findings, then the procedure report has been included in a sealed envelope for you to review at your convenience later.  YOU SHOULD EXPECT: Some feelings of bloating in the abdomen. Passage of more gas than usual.  Walking can help get rid of the air that was put into your GI tract during the procedure and reduce the bloating. If you had a lower endoscopy (such as a colonoscopy or flexible sigmoidoscopy) you may notice spotting of blood in your stool or on the toilet paper. If you underwent a bowel prep for your procedure, you may not have a normal bowel movement for a few days.  Please Note:  You might notice some irritation and congestion in your nose or some drainage.  This is from the oxygen used during your procedure.  There is no need for concern and it should clear up in a day or so.  SYMPTOMS TO REPORT IMMEDIATELY:  Following lower endoscopy (colonoscopy or flexible sigmoidoscopy):  Excessive amounts of blood in the stool  Significant tenderness or worsening of abdominal pains  Swelling of the abdomen that is new, acute  Fever of 100F or higher   For urgent or emergent issues, a gastroenterologist can be reached at any hour by calling [phone]. Do not use MyChart messaging for urgent concerns.    DIET:  We do recommend a small meal at first, but then you may proceed to your regular diet.  Drink plenty of fluids but you should avoid alcoholic beverages for 24 hours.  ACTIVITY:  You should plan  to take it easy for the rest of today and you should NOT DRIVE or use heavy machinery until tomorrow (because of the sedation medicines used during the test).    FOLLOW UP: Our staff will call the number listed on your records the next business day following your procedure.  We will call around 7:15- 8:00 am to check on you and address any questions or concerns that you may have regarding the information given to you following your procedure. If we do not reach you, we will leave a message.     If any biopsies were taken you will be contacted by phone or by letter within the next 1-3 weeks.  Please call us at [phone] if you have not heard about the biopsies in 3 weeks.    SIGNATURES/CONFIDENTIALITY: You and/or your care partner have signed paperwork which will be entered into your electronic medical record.  These signatures attest to the fact that that the information above on your After Visit Summary has been reviewed and is understood.  Full responsibility of the confidentiality of this discharge information lies with you and/or your care-partner.

## 2022-06-10 NOTE — Progress Notes (Signed)
Pt resting comfortably. VSS. Airway intact. SBAR complete to RN. All questions answered.   

## 2022-06-11 ENCOUNTER — Telehealth: Payer: Self-pay

## 2022-06-11 NOTE — Telephone Encounter (Signed)
  Follow up Call-     06/10/2022    8:22 AM  Call back number  Post procedure Call Back phone  # [phone]  Permission to leave phone message Yes     Patient questions:  Do you have a fever, pain , or abdominal swelling? No. Pain Score  0 *  Have you tolerated food without any problems? Yes.    Have you been able to return to your normal activities? Yes.    Do you have any questions about your discharge instructions: Diet   No. Medications  No. Follow up visit  No.  Do you have questions or concerns about your Care? No.  Actions: * If pain score is 4 or above: No action needed, pain <4.

## 2022-07-01 DIAGNOSIS — Z0000 Encounter for general adult medical examination without abnormal findings: Secondary | ICD-10-CM | POA: Diagnosis not present

## 2022-08-27 DIAGNOSIS — Z23 Encounter for immunization: Secondary | ICD-10-CM | POA: Diagnosis not present

## 2022-12-10 DIAGNOSIS — K080 Exfoliation of teeth due to systemic causes: Secondary | ICD-10-CM | POA: Diagnosis not present

## 2022-12-31 DIAGNOSIS — H25043 Posterior subcapsular polar age-related cataract, bilateral: Secondary | ICD-10-CM | POA: Diagnosis not present

## 2023-01-30 DIAGNOSIS — Z130 Encounter for screening for diseases of the blood and blood-forming organs and certain disorders involving the immune mechanism: Secondary | ICD-10-CM | POA: Diagnosis not present

## 2023-01-30 LAB — LAB REPORT - SCANNED
A1c: 5.2
EGFR: 62

## 2023-03-05 DIAGNOSIS — Z1231 Encounter for screening mammogram for malignant neoplasm of breast: Secondary | ICD-10-CM | POA: Diagnosis not present

## 2023-04-01 DIAGNOSIS — H25043 Posterior subcapsular polar age-related cataract, bilateral: Secondary | ICD-10-CM | POA: Diagnosis not present

## 2023-04-01 DIAGNOSIS — H2512 Age-related nuclear cataract, left eye: Secondary | ICD-10-CM | POA: Diagnosis not present

## 2023-04-01 DIAGNOSIS — H18413 Arcus senilis, bilateral: Secondary | ICD-10-CM | POA: Diagnosis not present

## 2023-04-01 DIAGNOSIS — H25013 Cortical age-related cataract, bilateral: Secondary | ICD-10-CM | POA: Diagnosis not present

## 2023-04-01 DIAGNOSIS — H2513 Age-related nuclear cataract, bilateral: Secondary | ICD-10-CM | POA: Diagnosis not present

## 2023-04-09 DIAGNOSIS — Z01419 Encounter for gynecological examination (general) (routine) without abnormal findings: Secondary | ICD-10-CM | POA: Diagnosis not present

## 2023-04-29 DIAGNOSIS — H9192 Unspecified hearing loss, left ear: Secondary | ICD-10-CM | POA: Diagnosis not present

## 2023-04-29 DIAGNOSIS — H93A1 Pulsatile tinnitus, right ear: Secondary | ICD-10-CM | POA: Diagnosis not present

## 2023-04-29 DIAGNOSIS — R0989 Other specified symptoms and signs involving the circulatory and respiratory systems: Secondary | ICD-10-CM | POA: Diagnosis not present

## 2023-04-29 DIAGNOSIS — H9042 Sensorineural hearing loss, unilateral, left ear, with unrestricted hearing on the contralateral side: Secondary | ICD-10-CM | POA: Diagnosis not present

## 2023-04-29 DIAGNOSIS — H9311 Tinnitus, right ear: Secondary | ICD-10-CM | POA: Diagnosis not present

## 2023-05-02 ENCOUNTER — Other Ambulatory Visit (HOSPITAL_BASED_OUTPATIENT_CLINIC_OR_DEPARTMENT_OTHER): Payer: Self-pay | Admitting: Physician Assistant

## 2023-05-02 DIAGNOSIS — R0989 Other specified symptoms and signs involving the circulatory and respiratory systems: Secondary | ICD-10-CM

## 2023-05-12 ENCOUNTER — Ambulatory Visit (HOSPITAL_BASED_OUTPATIENT_CLINIC_OR_DEPARTMENT_OTHER)
Admission: RE | Admit: 2023-05-12 | Discharge: 2023-05-12 | Disposition: A | Discharge status: Home / Self Care | Payer: Medicare Other | Source: Ambulatory Visit | Attending: Physician Assistant | Admitting: Physician Assistant

## 2023-05-12 DIAGNOSIS — R0989 Other specified symptoms and signs involving the circulatory and respiratory systems: Secondary | ICD-10-CM | POA: Diagnosis not present

## 2023-06-02 DIAGNOSIS — H25042 Posterior subcapsular polar age-related cataract, left eye: Secondary | ICD-10-CM | POA: Diagnosis not present

## 2023-06-02 DIAGNOSIS — H52209 Unspecified astigmatism, unspecified eye: Secondary | ICD-10-CM | POA: Diagnosis not present

## 2023-06-02 DIAGNOSIS — H2512 Age-related nuclear cataract, left eye: Secondary | ICD-10-CM | POA: Diagnosis not present

## 2023-06-03 DIAGNOSIS — H2511 Age-related nuclear cataract, right eye: Secondary | ICD-10-CM | POA: Diagnosis not present

## 2023-06-23 DIAGNOSIS — H25011 Cortical age-related cataract, right eye: Secondary | ICD-10-CM | POA: Diagnosis not present

## 2023-06-23 DIAGNOSIS — H2511 Age-related nuclear cataract, right eye: Secondary | ICD-10-CM | POA: Diagnosis not present

## 2023-06-23 DIAGNOSIS — H52209 Unspecified astigmatism, unspecified eye: Secondary | ICD-10-CM | POA: Diagnosis not present

## 2023-06-23 DIAGNOSIS — H25041 Posterior subcapsular polar age-related cataract, right eye: Secondary | ICD-10-CM | POA: Diagnosis not present

## 2023-07-02 DIAGNOSIS — H25041 Posterior subcapsular polar age-related cataract, right eye: Secondary | ICD-10-CM | POA: Diagnosis not present

## 2023-07-31 ENCOUNTER — Other Ambulatory Visit: Payer: Self-pay

## 2023-07-31 DIAGNOSIS — K080 Exfoliation of teeth due to systemic causes: Secondary | ICD-10-CM | POA: Diagnosis not present

## 2023-07-31 DIAGNOSIS — I6523 Occlusion and stenosis of bilateral carotid arteries: Secondary | ICD-10-CM

## 2023-08-06 ENCOUNTER — Encounter: Payer: Medicare Other | Admitting: Vascular Surgery

## 2023-08-06 ENCOUNTER — Ambulatory Visit (HOSPITAL_COMMUNITY): Payer: Medicare Other

## 2023-08-13 DIAGNOSIS — L814 Other melanin hyperpigmentation: Secondary | ICD-10-CM | POA: Diagnosis not present

## 2023-08-13 DIAGNOSIS — L578 Other skin changes due to chronic exposure to nonionizing radiation: Secondary | ICD-10-CM | POA: Diagnosis not present

## 2023-08-13 DIAGNOSIS — L821 Other seborrheic keratosis: Secondary | ICD-10-CM | POA: Diagnosis not present

## 2023-08-13 DIAGNOSIS — D225 Melanocytic nevi of trunk: Secondary | ICD-10-CM | POA: Diagnosis not present

## 2023-08-18 NOTE — Progress Notes (Signed)
 [facility] [place]  ASSESSMENT / PLAN: 68 y.o. female with pulsatile tinnitus. No clear cause found on vascular workup to date. Possible left subclavian artery stenosis or occlusion based on duplex which is asymptomatic.  I do not think the left subclavian artery stenosis can explain her pulsatile tinnitus.  Recommend:  Abstinence from all tobacco products. Blood glucose control with goal A1c < 7%. Blood pressure control with goal blood pressure < 140/90 mmHg. Lipid reduction therapy with goal LDL-C <100 mg/dL  Aspirin  81mg  PO QD.  Atorvastatin 40-80mg  PO QD (or other high intensity statin therapy).  Follow-up with me as needed.  CHIEF COMPLAINT: Pulsatile tinnitus  HISTORY OF PRESENT ILLNESS: Catherine Lawson is a 68 y.o. female referred to clinic for evaluation pulsatile tinnitus.  Patient has been evaluated by her primary care physician as well as physician assistant for otolaryngology.  Vascular evaluation was performed.  This showed no carotid artery stenosis.  The patient does have left subclavian artery stenosis on duplex.  The patient reports minimal symptoms related to her left upper extremity.  The patient regularly exercises and performed weight training activities, and does notice her left arm tired during this, but does not need to stop exercise.  She denies any claudication, rest pain, or ischemic ulceration symptoms.  I reviewed her noninvasive testing.  I counseled her about the differential diagnosis of pulsatile tinnitus.  I counseled her that I could not find a clear vascular explanation for her symptoms.   Past Medical History:  Diagnosis Date   Arthritis    bil thumb joints, back   Cervical stenosis (uterine cervix)    Eczema    Family history of adverse reaction to anesthesia    sister-- severe ponv   Family history of breast cancer    Family history of colon cancer    Family history of stomach cancer    History of adenomatous polyp of  colon    History of cancer chemotherapy 06-27-2015  to 08-30-2015   right breast cancer   History of external beam radiation therapy 09-26-2015  to 11-10-2015   right breast cancer   History of supraventricular tachycardia (08-03-2020  pt denies any symptoms since 2017   previously followed by cardiology, dr kelsie,  s/p  svt ablation 11-28-2015,  lov note in epic 12-25-2015 pt released as needed;  echo and event monitor in epic 2017   Malignant neoplasm of upper-outer quadrant of right breast in female, estrogen receptor positive [facility]) oncologist--- dr layla   dx 11/ 2016--- Stage 1A, Grade 2, IDC, ER/PR positive;  s/p  right lumpectomy w/ sln dissection;  completed chemo 08-30-2015 and radation 11-10-2015   PMB (postmenopausal bleeding)    S/P ablation of ventricular arrhythmia 11/28/2015   Thickened endometrium     Past Surgical History:  Procedure Laterality Date   COLONOSCOPY  last one 11-19-2018   DILATION AND CURETTAGE OF UTERUS N/A 08/07/2020   Procedure: DILATATION AND CURETTAGE OF UTERUS WITH HYSTEROSCOPY, MYOSURE;  Surgeon: Viktoria Comer SAUNDERS, MD;  Location: [facility];  Service: Gynecology;  Laterality: N/A;   ELECTROPHYSIOLOGIC STUDY N/A 11/28/2015   left lateral accessory pathway and slow pathway ablation performed by Dr Kelsie   POLYPECTOMY     PORT-A-CATH REMOVAL  06/2016   PORTACATH PLACEMENT Right 06/27/2015   Procedure: INSERTION PORT-A-CATH;  Surgeon: Morene Olives, MD;  Location: [facility];  Service: General;  Laterality: Right;   RADIOACTIVE SEED GUIDED PARTIAL MASTECTOMY WITH AXILLARY  SENTINEL LYMPH NODE BIOPSY Right 05/29/2015   Procedure: RADIOACTIVE SEED RIGHT BREAST LUMPECTOMY WITH AXILLARY SENTINEL LYMPH NODE BIOPSY;  Surgeon: Morene Olives, MD;  Location: [facility];  Service: General;  Laterality: Right;   RE-EXCISION OF BREAST LUMPECTOMY Right 06/12/2015   Procedure: RE-EXCISION OF RIGHT  BREAST  LUMPECTOMY;  Surgeon: Morene Olives, MD;  Location: [facility];  Service: General;  Laterality: Right;   WISDOM TOOTH EXTRACTION      Family History  Problem Relation Age of Onset   Breast cancer Sister 48   Colon cancer Father 72   Cancer Maternal Grandmother        probable ovarian cancer   Stomach cancer Paternal Grandmother 83   Brain cancer Other 10       Medulloblastoma   Parkinson's disease Mother    Lung cancer Paternal Uncle        smoker   Stomach cancer Other        PGMs sister   Stomach cancer Other        PMGs brother   Colon polyps Brother    Colon polyps Sister    Breast cancer Sister        DCIS dx early 51s   Prostate cancer Cousin        stage IV dx 24s   Esophageal cancer Neg Hx    Rectal cancer Neg Hx    Uterine cancer Neg Hx     Social History   Socioeconomic History   Marital status: Married    Spouse name: Acupuncturist   Number of children: 0   Years of education: Not on file   Highest education level: Not on file  Occupational History   Not on file  Tobacco Use   Smoking status: Never   Smokeless tobacco: Never  Vaping Use   Vaping status: Never Used  Substance and Sexual Activity   Alcohol use: Yes    Alcohol/week: 4.0 - 5.0 standard drinks of alcohol    Types: 4 - 5 Glasses of wine per week    Comment: social   Drug use: Never   Sexual activity: Not on file  Other Topics Concern   Not on file  Social History Narrative   Lives in [place]   Retired   married   Social Drivers of Corporate Investment Banker Strain: Not on file  Food Insecurity: Not on file  Transportation Needs: Not on file  Physical Activity: Not on file  Stress: Not on file  Social Connections: Not on file  Intimate Partner Violence: Not on file    Allergies  Allergen Reactions   Other Itching and Dermatitis    Seasonal Allergies    Current Outpatient Medications  Medication Sig Dispense Refill   calcium citrate (CALCITRATE - DOSED IN MG ELEMENTAL  CALCIUM) 950 (200 Ca) MG tablet Take 200 mg of elemental calcium by mouth daily.     cetirizine (ZYRTEC) 10 MG tablet Take 10 mg by mouth daily.     Crisaborole 2 % OINT as needed.     fluocinolone (SYNALAR) 0.01 % external solution Apply topically.     Multiple Vitamin (MULTIVITAMIN) tablet Take 1 tablet by mouth daily.     Omega-3 Fatty Acids (FISH OIL TRIPLE STRENGTH) 1400 MG CAPS Take 1,400 mg by mouth daily.     PREBIOTIC PRODUCT PO Take by mouth.     Probiotic Product (PROBIOTIC PO) Take by mouth.     TURMERIC PO Take by mouth  daily.     No current facility-administered medications for this visit.    PHYSICAL EXAM Vitals:   08/19/23 1359  BP: 100/71  Pulse: 80  Resp: 20  Temp: 98.1 F (36.7 C)  SpO2: 95%  Weight: 155 lb (70.3 kg)  Height: 5' 5 (1.651 m)    Well-appearing woman.  Appears younger than stated age. Regular rate and rhythm Unlabored breathing Absent left radial pulse 2+ right radial pulse  PERTINENT LABORATORY AND RADIOLOGIC DATA  Most recent CBC    Latest Ref Rng & Units 03/06/2020   10:26 AM 03/04/2019    1:19 PM 03/03/2018    1:14 PM  CBC  WBC 4.0 - 10.5 K/uL 5.5  6.3  6.3   Hemoglobin 12.0 - 15.0 g/dL 87.5  87.3  86.2   Hematocrit 36.0 - 46.0 % 37.7  37.2  40.3   Platelets 150 - 400 K/uL 234  241  237      Most recent CMP    Latest Ref Rng & Units 03/06/2021   11:29 AM 10/02/2020    1:46 PM 03/06/2020   10:26 AM  CMP  Glucose 70 - 99 mg/dL 86  92  90   BUN 8 - 23 mg/dL 18  16  12    Creatinine 0.44 - 1.00 mg/dL 9.13  9.03  9.12   Sodium 135 - 145 mmol/L 142  139  138   Potassium 3.5 - 5.1 mmol/L 4.6  4.2  4.5   Chloride 98 - 111 mmol/L 108  104  107   CO2 22 - 32 mmol/L 26  27  23    Calcium 8.9 - 10.3 mg/dL 8.8  9.4  9.1   Total Protein 6.5 - 8.1 g/dL 6.7  7.4  6.9   Total Bilirubin 0.3 - 1.2 mg/dL 0.5  0.6  0.5   Alkaline Phos 38 - 126 U/L 64  84  70   AST 15 - 41 U/L 18  28  18    ALT 0 - 44 U/L 13  25  13      Renal function CrCl  cannot be calculated (Patient's most recent lab result is older than the maximum 21 days allowed.).  Hgb A1c MFr Bld (%)  Date Value  09/07/2015 6.9 (H)    LDL Cholesterol  Date Value Ref Range Status  09/08/2015 58 0 - 99 mg/dL Final    Comment:           Total Cholesterol/HDL:CHD Risk Coronary Heart Disease Risk Table                     Men   Women  1/2 Average Risk   3.4   3.3  Average Risk       5.0   4.4  2 X Average Risk   9.6   7.1  3 X Average Risk  23.4   11.0        Use the calculated Patient Ratio above and the CHD Risk Table to determine the patient's CHD Risk.        ATP III CLASSIFICATION (LDL):  <100     mg/dL   Optimal  899-870  mg/dL   Near or Above                    Optimal  130-159  mg/dL   Borderline  839-810  mg/dL   High  >809     mg/dL   Very High  Outside carotid duplex 05/12/23 Color duplex indicates minimal homogeneous plaque, with no hemodynamically significant stenosis by duplex criteria in the extracranial cerebrovascular circulation.   Reversal of flow in the left vertebral artery suggesting proximal subclavian stenosis/occlusion. Office based assessment of upper extremity blood pressures may be useful, or alternatively imaging with CTA.  Debby SAILOR. Magda, MD FACS [facility] Phone Number: [phone] 08/21/2023 11:34 AM   Total time spent on preparing this encounter including chart review, data review, collecting history, examining the patient, coordinating care for this new patient, 60 minutes.  Portions of this report may have been transcribed using voice recognition software.  Every effort has been made to ensure accuracy; however, inadvertent computerized transcription errors may still be present.

## 2023-08-19 ENCOUNTER — Ambulatory Visit: Payer: Medicare Other | Admitting: Vascular Surgery

## 2023-08-19 ENCOUNTER — Encounter: Payer: Self-pay | Admitting: Vascular Surgery

## 2023-08-19 ENCOUNTER — Ambulatory Visit (HOSPITAL_COMMUNITY)
Admission: RE | Admit: 2023-08-19 | Discharge: 2023-08-19 | Disposition: A | Discharge status: Home / Self Care | Payer: Medicare Other | Source: Ambulatory Visit | Attending: Vascular Surgery | Admitting: Vascular Surgery

## 2023-08-19 VITALS — BP 100/71 | HR 80 | Temp 98.1°F | Resp 20 | Ht 65.0 in | Wt 155.0 lb

## 2023-08-19 DIAGNOSIS — H93A9 Pulsatile tinnitus, unspecified ear: Secondary | ICD-10-CM | POA: Diagnosis not present

## 2023-08-19 DIAGNOSIS — I6523 Occlusion and stenosis of bilateral carotid arteries: Secondary | ICD-10-CM

## 2024-02-02 ENCOUNTER — Ambulatory Visit: Payer: Medicare Other | Admitting: Internal Medicine

## 2024-02-05 DIAGNOSIS — K080 Exfoliation of teeth due to systemic causes: Secondary | ICD-10-CM | POA: Diagnosis not present

## 2024-02-12 ENCOUNTER — Ambulatory Visit: Payer: Self-pay | Admitting: Licensed Clinical Social Worker

## 2024-02-12 ENCOUNTER — Encounter: Payer: Self-pay | Admitting: Licensed Clinical Social Worker

## 2024-02-17 ENCOUNTER — Encounter: Payer: Self-pay | Admitting: Internal Medicine

## 2024-02-17 ENCOUNTER — Ambulatory Visit: Admitting: Internal Medicine

## 2024-02-17 VITALS — BP 100/80 | HR 80 | Temp 98.2°F | Ht 65.0 in | Wt 150.0 lb

## 2024-02-17 DIAGNOSIS — Z23 Encounter for immunization: Secondary | ICD-10-CM | POA: Diagnosis not present

## 2024-02-17 DIAGNOSIS — H1132 Conjunctival hemorrhage, left eye: Secondary | ICD-10-CM | POA: Diagnosis not present

## 2024-02-17 DIAGNOSIS — I471 Supraventricular tachycardia, unspecified: Secondary | ICD-10-CM | POA: Diagnosis not present

## 2024-02-17 DIAGNOSIS — E663 Overweight: Secondary | ICD-10-CM | POA: Diagnosis not present

## 2024-02-17 DIAGNOSIS — Z853 Personal history of malignant neoplasm of breast: Secondary | ICD-10-CM

## 2024-02-17 DIAGNOSIS — M8580 Other specified disorders of bone density and structure, unspecified site: Secondary | ICD-10-CM

## 2024-02-17 NOTE — Patient Instructions (Addendum)
 Let us  know if you want to do the heart monitor.

## 2024-02-17 NOTE — Progress Notes (Unsigned)
 Subjective:   Patient ID: Catherine Lawson, female    DOB: [birth year], 68 y.o.   MRN: [id number]  Discussed the use of AI scribe software for clinical note transcription with the patient, who gave verbal consent to proceed. The patient is a new 68 YO female coming in for: History of Present Illness Catherine Lawson is a 68 year old female with supraventricular tachycardia who presents with eye redness and intermittent heart palpitations.  She has been experiencing redness in her left eye, which she attributes to an allergic reaction possibly triggered by a change in environment during a recent trip to [facility]. The redness is non-painful and without crusting. She has been using an eye wash and lubricating eye drops, which have helped alleviate the itchiness. She has a history of cataract surgery in both eyes performed in November and December of the previous year.  She has a history of supraventricular tachycardia (SVT) that has been occurring intermittently since 2020. The episodes are characterized by a heart rate of 130-140 bpm, occurring both during rest and activity, and are not consistently associated with alcohol consumption. She monitors these episodes using a Fitbit, noting occurrences even during sleep. No changes in caffeine intake or use of over-the-counter medications like Sudafed.  She is a breast cancer survivor, having undergone chemotherapy, and is actively involved in supporting other women through their breast cancer journeys. She previously took anastrozole  for two years and tamoxifen  for three years, but discontinued due to side effects including uterine bleeding and thickening of the endometrial lining, which was treated with a procedure to remove a polyp.  She mentions a history of eczema as a baby and allergies as an adult, which she feels have been exacerbated by chemotherapy. She is sensitive to new environments, often experiencing allergic reactions.  She is  concerned about her weight, noting that she exercises regularly, including cycling and body pump classes, and maintains a healthy diet with salads and protein drinks. Despite this, she feels she is on the borderline of normal weight and overweight, particularly after menopause.  She has a history of a blockage in the clavicle artery. She reports that her left arm gets tired about halfway through body pump classes, but she continues to engage in regular physical activity without significant issues.  Review of Systems  Constitutional: Negative.   HENT: Negative.    Eyes:  Positive for redness.  Respiratory:  Negative for cough, chest tightness and shortness of breath.   Cardiovascular:  Positive for palpitations. Negative for chest pain and leg swelling.  Gastrointestinal:  Negative for abdominal distention, abdominal pain, constipation, diarrhea, nausea and vomiting.  Musculoskeletal: Negative.   Skin: Negative.   Neurological: Negative.   Psychiatric/Behavioral: Negative.      Objective:  Physical Exam Constitutional:      Appearance: She is well-developed.  HENT:     Head: Normocephalic and atraumatic.     Comments: Left eye with broken blood vessel and redness, no crusting or infection noted. Cardiovascular:     Rate and Rhythm: Normal rate and regular rhythm.  Pulmonary:     Effort: Pulmonary effort is normal. No respiratory distress.     Breath sounds: Normal breath sounds. No wheezing or rales.  Abdominal:     General: Bowel sounds are normal. There is no distension.     Palpations: Abdomen is soft.     Tenderness: There is no abdominal tenderness. There is no rebound.  Musculoskeletal:     Cervical  back: Normal range of motion.  Skin:    General: Skin is warm and dry.  Neurological:     Mental Status: She is alert and oriented to person, place, and time.     Coordination: Coordination normal.     Vitals:   02/17/24 1400  BP: 100/80  Pulse: 80  Temp: 98.2 F (36.8  C)  TempSrc: Oral  SpO2: 97%  Weight: 150 lb (68 kg)  Height: 5' 5 (1.651 m)    Assessment and Plan Assessment & Plan History of breast cancer in remission   Breast cancer is in remission. She supports other women and focuses on health maintenance.  Intermittent supraventricular tachycardia   Intermittent SVT with heart rates of 130-140 bpm, previously reaching 200 bpm, occurs at rest and after alcohol, with no significant symptoms. Contact Dr. Kelsie for further evaluation. Consider a heart monitor if the cardiologist appointment is delayed.  Broken blood vessel, left eye   causing redness without crusting, expected to resolve in 1-2 weeks. Use lubricating eye drops as needed and perform a saline eye wash if grittiness occurs.  History of cataract surgery, both eyes   Cataract surgery was completed last year with no current issues post-surgery.  Osteopenia, left forearm, improving   Osteopenia in the left forearm is improving with bone density nearing normal range. Schedule a bone density scan with the upcoming mammogram.  Atherosclerotic disease of left subclavian artery, asymptomatic   Asymptomatic atherosclerotic disease of the left subclavian artery with previous evaluation showing no significant concerns. Mild exercise-induced fatigue in the left arm.  Overweight   Borderline overweight despite a healthy lifestyle. BMI is within a healthy range for longevity, with stable weight. A BMI of 26-27 is beneficial in older adults. Continue the current exercise and dietary regimen.

## 2024-02-19 DIAGNOSIS — H1132 Conjunctival hemorrhage, left eye: Secondary | ICD-10-CM | POA: Insufficient documentation

## 2024-02-19 NOTE — Assessment & Plan Note (Signed)
 Intermittent SVT with heart rates of 130-140 bpm, previously reaching 200 bpm, occurs at rest and after alcohol, with no significant symptoms. Contact Dr. Kelsie for further evaluation since it has been some time since she has seen him. Order heart monitor if the cardiologist appointment is delayed.

## 2024-02-19 NOTE — Assessment & Plan Note (Signed)
 Borderline overweight despite a healthy lifestyle. BMI is within a healthy range for longevity, with stable weight. A BMI of 26-27 is beneficial in older adults. Continue the current exercise and dietary regimen.

## 2024-02-19 NOTE — Assessment & Plan Note (Signed)
 Reassurance given that this will resolve on own. Recommended if no eye assessment in last year to get this scheduled.

## 2024-02-19 NOTE — Assessment & Plan Note (Signed)
 Due for DEXA with next mammogram.

## 2024-02-19 NOTE — Assessment & Plan Note (Signed)
 Continue mammogram regularly.

## 2024-03-10 DIAGNOSIS — Z1231 Encounter for screening mammogram for malignant neoplasm of breast: Secondary | ICD-10-CM | POA: Diagnosis not present

## 2024-03-10 LAB — HM MAMMOGRAPHY

## 2024-03-16 ENCOUNTER — Encounter: Payer: Self-pay | Admitting: Internal Medicine

## 2024-05-11 ENCOUNTER — Telehealth: Payer: Self-pay

## 2024-05-11 DIAGNOSIS — M8580 Other specified disorders of bone density and structure, unspecified site: Secondary | ICD-10-CM

## 2024-05-11 NOTE — Telephone Encounter (Signed)
 I was unaware she needed an order from us . I have placed for dexa

## 2024-05-11 NOTE — Telephone Encounter (Signed)
 Please call patient - she wants to change the bone density location

## 2024-05-11 NOTE — Telephone Encounter (Signed)
 LVM for patient to call office. This is in reference to Dexa. Per Dr. Rollene order has been placed.

## 2024-05-11 NOTE — Telephone Encounter (Signed)
 Copied from CRM [id number]. Topic: Clinical - Request for Lab/Test Order >> May 11, 2024  8:18 AM Suzen RAMAN wrote: Reason for CRM: Patient states she was suppose to have an order placed for a repeat Bone Density Test (receives every 2 years). Patient states she discuss this at her previous appointment with Dr. Rollene and assumed the order was placed but was informed by [facility] located on [facility] that they never received an order.   Patient would like a call back once order is placed [phone]

## 2024-05-11 NOTE — Telephone Encounter (Signed)
 Order has been faxed to [facility] [phone]

## 2024-05-12 DIAGNOSIS — M85832 Other specified disorders of bone density and structure, left forearm: Secondary | ICD-10-CM | POA: Diagnosis not present

## 2024-05-12 LAB — HM DEXA SCAN

## 2024-05-12 NOTE — Telephone Encounter (Unsigned)
 Copied from CRM [id number]. Topic: Clinical - Request for Lab/Test Order >> May 11, 2024  8:18 AM Suzen RAMAN wrote: Reason for CRM: Patient states she was suppose to have an order placed for a repeat Bone Density Test (receives every 2 years). Patient states she discuss this at her previous appointment with Dr. Rollene and assumed the order was placed but was informed by [facility] located on [facility]. that they never received an order.   Patient would like a call back once order is placed CB#[phone] >> May 11, 2024  4:21 PM China J wrote: The patient is returning a callback from [place]. Although the order was placed, it was not sent to the correct location and the patient's appointment is set for tomorrow morning at 8:00 AM. I tried to connect to [place] but I could not get through. CAL confirmed that Edsel will be calling before closing time. >> May 11, 2024 12:02 PM Mia F wrote: Pt returning CMA call. CAL is at lunch. Pt was informed dexa scan order has been placed

## 2024-05-12 NOTE — Telephone Encounter (Signed)
 Order faxed to [facility] on 10/29@455pm . Pt is aware.

## 2024-05-13 ENCOUNTER — Encounter: Payer: Self-pay | Admitting: Internal Medicine

## 2024-05-31 ENCOUNTER — Telehealth: Payer: Self-pay

## 2024-05-31 DIAGNOSIS — I471 Supraventricular tachycardia, unspecified: Secondary | ICD-10-CM

## 2024-05-31 NOTE — Telephone Encounter (Signed)
 Copied from CRM #[id number]. Topic: Clinical - Medical Advice >> May 31, 2024 11:51 AM Victoria A wrote: Reason for CRM: Patient said that she is ready to have the Heart Monitor ordered per Dr. Rollene

## 2024-05-31 NOTE — Telephone Encounter (Signed)
 Copied from CRM #[id number]. Topic: Referral - Request for Referral >> May 31, 2024 11:51 AM Victoria A wrote: Did the patient discuss referral with their provider in the last year? Yes (If No - schedule appointment) (If Yes - send message)  Appointment offered? No  Type of order/referral and detailed reason for visit: Cardiology  Preference of office, provider, location: Dr. Lamar Rakers -Electro Physiologist  If referral order, have you been seen by this specialty before? No (If Yes, this issue or another issue? When? Where?  Can we respond through MyChart? Yes

## 2024-06-02 ENCOUNTER — Ambulatory Visit: Attending: Internal Medicine

## 2024-06-02 DIAGNOSIS — I471 Supraventricular tachycardia, unspecified: Secondary | ICD-10-CM

## 2024-06-02 NOTE — Telephone Encounter (Signed)
 Ordered monitor and referral to cardiology.

## 2024-06-02 NOTE — Telephone Encounter (Signed)
 Done in another encounter

## 2024-06-02 NOTE — Addendum Note (Signed)
 Addended by: ROLLENE NORRIS A on: 06/02/2024 07:40 AM   Modules accepted: Orders

## 2024-06-02 NOTE — Progress Notes (Unsigned)
 EP to read.

## 2024-06-03 NOTE — Telephone Encounter (Signed)
 Pt is aware.

## 2024-06-30 ENCOUNTER — Ambulatory Visit: Attending: Cardiovascular Disease | Admitting: Cardiovascular Disease

## 2024-06-30 ENCOUNTER — Encounter: Payer: Self-pay | Admitting: Emergency Medicine

## 2024-06-30 VITALS — BP 100/70 | HR 74 | Ht 65.0 in | Wt 152.0 lb

## 2024-06-30 DIAGNOSIS — I471 Supraventricular tachycardia, unspecified: Secondary | ICD-10-CM

## 2024-06-30 DIAGNOSIS — I6523 Occlusion and stenosis of bilateral carotid arteries: Secondary | ICD-10-CM

## 2024-06-30 MED ORDER — DILTIAZEM HCL 30 MG PO TABS: 30.0000 mg | ORAL_TABLET | Freq: Every day | ORAL | 1 refills | Status: AC | PRN

## 2024-06-30 NOTE — Patient Instructions (Signed)
 Medication Instructions:  You can take Diltiazem  30 mg up to 2 times a day as needed for palpitations *If you need a refill on your cardiac medications before your next appointment, please call your pharmacy*  Lab Work: None ordered If you have labs (blood work) drawn today and your tests are completely normal, you will receive your results only by: MyChart Message (if you have MyChart) OR A paper copy in the mail If you have any lab test that is abnormal or we need to change your treatment, we will call you to review the results.  Testing/Procedures: Your physician has requested that you have an echocardiogram. Echocardiography is a painless test that uses sound waves to create images of your heart. It provides your doctor with information about the size and shape of your heart and how well your hearts chambers and valves are working. This procedure takes approximately one hour. There are no restrictions for this procedure. Please do NOT wear cologne, perfume, aftershave, or lotions (deodorant is allowed). Please arrive 15 minutes prior to your appointment time.  Please note: We ask at that you not bring children with you during ultrasound (echo/ vascular) testing. Due to room size and safety concerns, children are not allowed in the ultrasound rooms during exams. Our front office staff cannot provide observation of children in our lobby area while testing is being conducted. An adult accompanying a patient to their appointment will only be allowed in the ultrasound room at the discretion of the ultrasound technician under special circumstances. We apologize for any inconvenience.   Follow-Up: At [facility], you and your health needs are our priority.  As part of our continuing mission to provide you with exceptional heart care, our providers are all part of one team.  This team includes your primary Cardiologist (physician) and Advanced Practice Providers or APPs (Physician  Assistants and Nurse Practitioners) who all work together to provide you with the care you need, when you need it.  Your next appointment:   1 year(s)  Provider:   Dr Francyne  We recommend signing up for the patient portal called MyChart.  Sign up information is provided on this After Visit Summary.  MyChart is used to connect with patients for Virtual Visits (Telemedicine).  Patients are able to view lab/test results, encounter notes, upcoming appointments, etc.  Non-urgent messages can be sent to your provider as well.   To learn more about what you can do with MyChart, go to [email or web address].

## 2024-06-30 NOTE — Progress Notes (Unsigned)
°  Cardiology Office Note   Date:  06/30/2024  ID:  Catherine Lawson, DOB March 28, 1956, MRN [id number] PCP: Rollene Almarie LABOR, MD  [facility] Providers Cardiologist:  None { Click to update primary MD,subspecialty MD or APP then REFRESH:1}    History of Present Illness Catherine Lawson is a 68 y.o. 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  She experiences frequent episodes of supraventricular tachycardia (SVT) with heart rates often reaching the 140s. These episodes have been increasing in frequency since her heart ablation in May 2017.  They were very infrequent during the first 3 years after ablation and then have steadily increased to becoming much more frequent in the last 3 years since ablation.  She tracks these episodes meticulously, noting that they occur both with and without alcohol consumption, indicating no clear trigger from alcohol.  The episodes typically last between 10 to 30 minutes, but can extend up to two hours. She uses a Fitbit to monitor her heart rate, which shows a graph of heart rate changes.  The episodes occur during sleep, relaxation, or exercise, with no consistent correlation to physical activity. She experiences these episodes multiple times a day and sometimes only notices them when reviewing her monitor data.  Onset and termination tends to be abrupt.  She is currently not taking any medications for these episodes. She has previously been on beta blockers such as carvedilol  and metoprolol , and briefly on flecainide , which was discontinued due to concerns about its safety.  Her past medical history includes a heart ablation for arrhythmia and breast cancer, for which she  underwent treatment with Herceptin . Exercise is important in her routine, both for general health and as a breast cancer survivor.  No skipped beats precede the current episodes of tachycardia, unlike previous episodes before her ablation, which were associated with a sensation of a wave or flush before the heart racing began.   Studies Reviewed      *** Risk Assessment/Calculations {Does this patient have ATRIAL FIBRILLATION?:[phone]}         Physical Exam VS:  BP 100/70 (BP Location: Right Arm)   Pulse 74   Ht 5' 5 (1.651 m)   Wt 152 lb (68.9 kg)   SpO2 96%   BMI 25.29 kg/m        Wt Readings from Last 3 Encounters:  06/30/24 152 lb (68.9 kg)  02/17/24 150 lb (68 kg)  08/19/23 155 lb (70.3 kg)    GEN: Well nourished, well developed in no acute distress NECK: No JVD; No carotid bruits CARDIAC: ***RRR, no murmurs, rubs, gallops RESPIRATORY:  Clear to auscultation without rales, wheezing or rhonchi  ABDOMEN: Soft, non-tender, non-distended EXTREMITIES:  No edema; No deformity   ASSESSMENT AND PLAN ***    {Are you ordering a CV Procedure (e.g. stress test, cath, DCCV, TEE, etc)?   Press F2        :[id number]}  Dispo: ***  Signed, Jerel Balding, MD

## 2024-07-02 ENCOUNTER — Ambulatory Visit: Payer: Self-pay | Admitting: Internal Medicine
